# Patient Record
Sex: Female | Born: 1985
Health system: Southern US, Community
[De-identification: ages and names within clinical notes are randomized; demographics above are authoritative.]

## PROBLEM LIST (undated history)

## (undated) DIAGNOSIS — B009 Herpesviral infection, unspecified: Secondary | ICD-10-CM

## (undated) DIAGNOSIS — S73191A Other sprain of right hip, initial encounter: Secondary | ICD-10-CM

## (undated) DIAGNOSIS — T7840XA Allergy, unspecified, initial encounter: Secondary | ICD-10-CM

## (undated) DIAGNOSIS — Z8619 Personal history of other infectious and parasitic diseases: Secondary | ICD-10-CM

## (undated) DIAGNOSIS — R569 Unspecified convulsions: Secondary | ICD-10-CM

## (undated) DIAGNOSIS — M7061 Trochanteric bursitis, right hip: Secondary | ICD-10-CM

## (undated) DIAGNOSIS — G894 Chronic pain syndrome: Secondary | ICD-10-CM

## (undated) DIAGNOSIS — S069XAA Unspecified intracranial injury with loss of consciousness status unknown, initial encounter: Secondary | ICD-10-CM

## (undated) DIAGNOSIS — T50901A Poisoning by unspecified drugs, medicaments and biological substances, accidental (unintentional), initial encounter: Secondary | ICD-10-CM

## (undated) DIAGNOSIS — K589 Irritable bowel syndrome without diarrhea: Secondary | ICD-10-CM

## (undated) DIAGNOSIS — F418 Other specified anxiety disorders: Secondary | ICD-10-CM

## (undated) DIAGNOSIS — G8929 Other chronic pain: Secondary | ICD-10-CM

## (undated) DIAGNOSIS — S069X9A Unspecified intracranial injury with loss of consciousness of unspecified duration, initial encounter: Secondary | ICD-10-CM

## (undated) DIAGNOSIS — J309 Allergic rhinitis, unspecified: Secondary | ICD-10-CM

## (undated) DIAGNOSIS — S0990XA Unspecified injury of head, initial encounter: Secondary | ICD-10-CM

## (undated) DIAGNOSIS — Z87440 Personal history of urinary (tract) infections: Secondary | ICD-10-CM

## (undated) DIAGNOSIS — Z8669 Personal history of other diseases of the nervous system and sense organs: Secondary | ICD-10-CM

## (undated) DIAGNOSIS — M25551 Pain in right hip: Secondary | ICD-10-CM

## (undated) HISTORY — DX: Poisoning by unspecified drugs, medicaments and biological substances, accidental (unintentional), initial encounter: T50.901A

## (undated) HISTORY — PX: WISDOM TOOTH EXTRACTION: SHX21

## (undated) HISTORY — DX: Other specified anxiety disorders: F41.8

## (undated) HISTORY — PX: MOLE REMOVAL: SHX2046

## (undated) HISTORY — DX: Unspecified convulsions: R56.9

## (undated) HISTORY — DX: Allergy, unspecified, initial encounter: T78.40XA

## (undated) HISTORY — DX: Pain in right hip: M25.551

## (undated) HISTORY — DX: Chronic pain syndrome: G89.4

## (undated) HISTORY — DX: Other chronic pain: G89.29

## (undated) HISTORY — DX: Irritable bowel syndrome, unspecified: K58.9

## (undated) HISTORY — DX: Personal history of other diseases of the nervous system and sense organs: Z86.69

## (undated) HISTORY — DX: Unspecified intracranial injury with loss of consciousness of unspecified duration, initial encounter: S06.9X9A

## (undated) HISTORY — DX: Unspecified intracranial injury with loss of consciousness status unknown, initial encounter: S06.9XAA

## (undated) HISTORY — DX: Personal history of other infectious and parasitic diseases: Z86.19

## (undated) HISTORY — DX: Other sprain of right hip, initial encounter: S73.191A

## (undated) HISTORY — DX: Herpesviral infection, unspecified: B00.9

## (undated) HISTORY — DX: Trochanteric bursitis, right hip: M70.61

## (undated) HISTORY — DX: Unspecified injury of head, initial encounter: S09.90XA

## (undated) HISTORY — DX: Personal history of urinary (tract) infections: Z87.440

---
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
## 2005-10-22 ENCOUNTER — Emergency Department (HOSPITAL_COMMUNITY): Admission: EM | Admit: 2005-10-22 | Discharge: 2005-10-22 | Payer: Self-pay | Admitting: Emergency Medicine

## 2005-11-15 ENCOUNTER — Ambulatory Visit (HOSPITAL_COMMUNITY): Admission: RE | Admit: 2005-11-15 | Discharge: 2005-11-15 | Payer: Self-pay | Admitting: Family Medicine

## 2006-01-29 LAB — US OB COMP LESS 14 WKS

## 2006-06-18 ENCOUNTER — Inpatient Hospital Stay (HOSPITAL_COMMUNITY): Admission: AD | Admit: 2006-06-18 | Discharge: 2006-06-18 | Payer: Self-pay | Admitting: Obstetrics and Gynecology

## 2006-08-10 ENCOUNTER — Inpatient Hospital Stay (HOSPITAL_COMMUNITY): Admission: AD | Admit: 2006-08-10 | Discharge: 2006-08-12 | Payer: Self-pay | Admitting: Obstetrics and Gynecology

## 2006-08-16 ENCOUNTER — Inpatient Hospital Stay (HOSPITAL_COMMUNITY): Admission: AD | Admit: 2006-08-16 | Discharge: 2006-08-17 | Payer: Self-pay | Admitting: Obstetrics and Gynecology

## 2006-09-29 ENCOUNTER — Encounter: Admission: RE | Admit: 2006-09-29 | Discharge: 2006-12-28 | Payer: Self-pay | Admitting: Obstetrics and Gynecology

## 2008-01-24 ENCOUNTER — Emergency Department (HOSPITAL_COMMUNITY): Admission: EM | Admit: 2008-01-24 | Discharge: 2008-01-24 | Payer: Self-pay | Admitting: Emergency Medicine

## 2009-05-12 DIAGNOSIS — M25551 Pain in right hip: Secondary | ICD-10-CM

## 2009-05-12 HISTORY — DX: Pain in right hip: M25.551
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
## 2010-06-29 NOTE — Discharge Summary (Signed)
NAME:  Terri, [name]                         ACCOUNT NO.:  [id number]   MEDICAL RECORD NO.:  [id number]                   PATIENT TYPE:  INP   LOCATION:  0102                                 [facility]ILITY[facility]   PHYSICIAN:  Carolanne Grumbling, M.D.                 DATE OF BIRTH:  1985/07/12   DATE OF ADMISSION:  10/29/2002  DATE OF DISCHARGE:  10/29/2002                                 DISCHARGE SUMMARY   REPORT TITLE:  ADMISSION/DISCHARGE NOTE   INITIAL ASSESSMENT AND DIAGNOSES:  Terri [name] had been admitted to the hospital  by her parents and, at the time of admission, they had decided that they did  not want her in the hospital and were asking for her discharge.  She had  spent the evening in the emergency room waiting to be evaluated, so she and  her family were exhausted at the time of admission which was 12 hours later.  She had taken two Xanax earlier that day and then seven Xanax later that day  and, at some point, she no longer remembered taking anything but reportedly  she had taken more.  She said after taking the first two, she could not  recall anything.  She said she had no reason to want to kill herself.  She  said she had been using drugs but her parents said that was highly unlikely  also.  There was nothing particularly upsetting her at the time.  She said  she was stressed out by school and relationships and friends and she just  wanted to get some sleep and that is why she took the medication initially  but could not vouch for anything she did after that because she said she did  not recall it.  Her parents tried to arouse her the next morning and had  difficulty doing it.  Her father is a Optician, dispensing and a Acupuncturist at one  point and said in his young years he had been into drugs himself and had  been around a lot of drugs.  He knew what he was looking at and consequently  he took her to the hospital just to make sure she was okay.   In the process of evaluation, it was  decided that she needed to be admitted  to the hospital; at that moment they thought that might be reasonable.  After the long wait and thinking about it, both parents agreed that they  would rather have her home.  She maintained that she had used drugs such as  cocaine, marijuana, and alcohol.  Her parents knew she had used alcohol but  they said it was highly unlikely she had used marijuana or cocaine.  They  said she has been with them almost the whole time.  Even when she goes to  school, she goes to the school that the parents operate from [employer],  so they  see her during the daytime also.  They say she has always had  difficulty with following the rules and not being truthful about the things  she is doing.  She wants to live a life of more freedom than she has through  their supervision and their religious views.  They tried to find a set of  line between where they hold her accountable but at the same time, do not  squelch her so completely that she cannot find her own way in her life.  They obviously are very caring and loving people and she admitted that they  were.   MENTAL STATUS EXAMINATION:  Mental status at the time of the initial  evaluation revealed a very tired, sleepy, young woman who was cooperative.  She admitted to taking Xanax but denied any suicidal thoughts at the time,  saying that she wanted to just break her stress and get some sleep.  She  could not recall having taken the amount that she reportedly had taken.  There was no evidence of any thought disorder or other psychosis.  Short-  term and long-term memory were intact.  Judgment currently was adequate.  Insight was minimal.  Intellectual functioning seemed at least average.  Concentration was poor based on her sleepiness.  All pertinent history was  obtained from the parents.   HOSPITAL COURSE:  There was no hospital course because she was admitted and  discharged based on her parents' wishes and my  conclusion that she would be  well supervised and unlikely that she would do any damage to herself for  suicidal or other reasons.  Consequently, she was discharged.   LABORATORY DATA:  There were no laboratory examinations.   ADMISSION/DISCHARGE DIAGNOSES:   AXIS I:  1. Adjustment disorder with mixed emotional disturbance.  2. Alcohol abuse.   AXIS II:  Deferred.   AXIS III:  Healthy.   AXIS IV:  Moderate to severe with family, school, and social stressors.   AXIS V:  50/65.   POSTHOSPITAL CARE PLANS:  The family preferred a Librarian, academic and  were referred to Dr. Marliss Czar for followup.  The parents will pursue  psychiatric followup if needed but at the moment she is taking no  medications and they were not interested at that point in using medications  because she had not been particularly depressed or anxious.  Her issues  seemed to have been more behavioral and stress related.  There were no  restrictions placed on her activity or her diet.                                               Carolanne Grumbling, M.D.    GT/MEDQ  D:  11/11/2002  T:  11/11/2002  Job:  [id number]

## 2010-06-29 NOTE — Op Note (Signed)
NAME:  Terri [name], Terri [name]               ACCOUNT NO.:  [id number]   MEDICAL RECORD NO.:  [id number]          PATIENT TYPE:  AMB   LOCATION:  DSC                          FACILITY:  [facility]   PHYSICIAN:  Alfredia Ferguson, M.D.  DATE OF BIRTH:  December 28, 1985   DATE OF PROCEDURE:  03/08/2004  DATE OF DISCHARGE:                                 OPERATIVE REPORT   PREOPERATIVE DIAGNOSIS:  Biopsy proven dysplastic nevus right scapular area  2.5 cm x 1.5 cm.   POSTOPERATIVE DIAGNOSIS:  Biopsy proven dysplastic nevus right scapular area  2.5 cm x 1.5 cm.   OPERATION PERFORMED:  Excision of biopsy site with approximately 3 mm  margins and primary closure.   SURGEON:  Alfredia Ferguson, M.D.   ANESTHESIA:  MAC supplemented with 1% Xylocaine 1:100,000 epinephrine in the  excision site.   INDICATIONS FOR PROCEDURE:  The patient is a 25 year old female with a  biopsy proven dysplastic nevus with severe atypia.  She has been advised to  undergo re-excision of the biopsy site to clear margins.  The patient  understands she will be trading what she has for a permanent and unsightly  scar.  In spite of that she wishes to proceed.  Patient admitted  preoperatively because she was two days late on her period.  She was advised  to undergo pregnancy test and she refused.  She was offered the opportunity  to postpone her surgery to ensure that her period does start and that she is  not pregnant.  She refused.  She wishes to proceed in spite of the  possibility of damage to the fetus if she is pregnant.  In consultation with  the anesthesiologist and the patient and me, we opted to proceed since it is  being done under intravenous analgesia and not general anesthesia.   DESCRIPTION OF PROCEDURE:  Elliptical skin marker was placed around the  lesion with 3 to 4 mm margins.  The patient was then rolled into a left  lateral decubitus position and she was given intravenous analgesia.  Local  anesthesia was infiltrated  and the area was prepped with Betadine and draped  with sterile drapes.  An elliptical excision of the lesion down to the level  of the subcutaneous tissue was carried out.  Specimen was submitted for  pathology.  The wound edges were undermined for a distance of 1 cm in all  directions.  The wound was closed with interrupted 4-0 Monocryl suture for  the dermis followed by running 4-0 Monocryl subcuticular.  The patient  tolerated the procedure well with minimal blood loss.  She was transported  to the recovery room in satisfactory condition.      WBB/MEDQ  D:  03/08/2004  T:  03/08/2004  Job:  [id number]
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
## 2010-07-04 ENCOUNTER — Encounter: Payer: Self-pay | Admitting: Family Medicine

## 2010-07-05 ENCOUNTER — Telehealth: Payer: Self-pay | Admitting: Family Medicine

## 2010-07-05 ENCOUNTER — Ambulatory Visit (INDEPENDENT_AMBULATORY_CARE_PROVIDER_SITE_OTHER): Payer: BC Managed Care – PPO | Admitting: Family Medicine

## 2010-07-05 ENCOUNTER — Encounter: Payer: Self-pay | Admitting: Family Medicine

## 2010-07-05 VITALS — BP 122/73 | HR 73 | Temp 98.4°F | Ht 64.0 in | Wt 102.0 lb

## 2010-07-05 DIAGNOSIS — F329 Major depressive disorder, single episode, unspecified: Secondary | ICD-10-CM

## 2010-07-05 DIAGNOSIS — F411 Generalized anxiety disorder: Secondary | ICD-10-CM | POA: Insufficient documentation

## 2010-07-05 DIAGNOSIS — F418 Other specified anxiety disorders: Secondary | ICD-10-CM | POA: Insufficient documentation

## 2010-07-05 DIAGNOSIS — F410 Panic disorder [episodic paroxysmal anxiety] without agoraphobia: Secondary | ICD-10-CM | POA: Insufficient documentation

## 2010-07-05 DIAGNOSIS — M25551 Pain in right hip: Secondary | ICD-10-CM | POA: Insufficient documentation

## 2010-07-05 DIAGNOSIS — M25559 Pain in unspecified hip: Secondary | ICD-10-CM

## 2010-07-05 DIAGNOSIS — R634 Abnormal weight loss: Secondary | ICD-10-CM | POA: Insufficient documentation

## 2010-07-05 DIAGNOSIS — F32A Depression, unspecified: Secondary | ICD-10-CM

## 2010-07-05 HISTORY — DX: Generalized anxiety disorder: F41.1

## 2010-07-05 HISTORY — DX: Panic disorder (episodic paroxysmal anxiety): F41.0

## 2010-07-05 MED ORDER — CLONAZEPAM 0.5 MG PO TABS: ORAL_TABLET | ORAL | Status: DC

## 2010-07-05 MED ORDER — CITALOPRAM HYDROBROMIDE 20 MG PO TABS: 20.0000 mg | ORAL_TABLET | Freq: Every day | ORAL | Status: DC

## 2010-07-05 MED ORDER — HYDROCODONE-ACETAMINOPHEN 5-325 MG PO TABS: ORAL_TABLET | ORAL | Status: DC

## 2010-07-05 NOTE — Assessment & Plan Note (Signed)
Likely associated with poor PO intake related to her mood and anxiety disorders. Will check a few screening labs: TSH, CMET, CBC w/diff.  Our lab is not open today so she'll make lab appt in near future to get these drawn.

## 2010-07-05 NOTE — Patient Instructions (Signed)
Make lab appointment (here) for blood work at your earliest convenience. Make f/u appt with me in 3-4 wks.

## 2010-07-05 NOTE — Assessment & Plan Note (Signed)
See plan for GAD and depression.

## 2010-07-05 NOTE — Assessment & Plan Note (Signed)
Start citalopram 20mg  qd. Restart clonazepam bid PRN (0.5mg  tabs, 1-2 at a time).  Therapeutic expectations and side effect profile of medication discussed today.  Patient's questions answered.  Encouraged counseling, and she is interested in this.  See info under "depression" problem.

## 2010-07-05 NOTE — Progress Notes (Addendum)
Office Note 07/05/2010  CC:  Chief Complaint  Patient presents with  . Establish Care    new patient/ pt lost 15 lbs  . Anxiety    off and on since a little kid    HPI:  Terri [name] is a 25 y.o. White female who is here to establish care and discuss anxiety and depression problems. Patient's most recent primary MD: Dr. Erling Conte at [facility] [place].   Specialists: Dr. Merlyn Albert (ortho).  [name]. Old records were not reviewed prior to or during today's visit.  Patient reports extensive long term problem with anxiety/excessive worry, gradually getting worse over the last 1-2 yrs and more and more often associated with panic episodes--some provoked by excessive worrying and some spontaneous/unprovoked.  Describes these as sudden onset of severe fear/anxiety, heart racing, sweating, tremulousness, chest tightness, lightheadedness, feeling that something awful is going to happen to her.  These peak n 10-20 min and subside quickly after this point usually, but occasionally she'll be feeling awful like this for 1/2 a day or so.  Has been on klonopin in the past, helped somewhat but she had to d/c this when she joined the [employer] in 2011. Also reports significant feelings of depression, sadness, worthlessness over the last few months.  Sleep problems+, appetite down, energy level down.  No suicidal or homicidal thoughts.  In the past has been offered antidepressant but pt declined them for various reasons but she is now ready to try one.  Is also interested in counseling. Significant worries/stressors: has 26 y/o son who is primarily cared for by her parents.  Stressful job.  Fiance is stationed in another state in [employer].  She does not self medicate with drugs/alcohol.  She used to smoke cigs but quit.        Of note, she does report that she gave a child up for adoption shortly after birth, but when asked if she is aware of excessive guilt about this or frequent "reliving" of the  event, she reports that she hardly ever thinks about it at all. She does also report a distant history of closed head injury sustained in accidents as a toddler, resulting in ICH per her report, with subsequent seizure d/o for 2 yrs or so.  No significant residual disability, although she reports she struggled a lot to get through HS and she found that she was simply unable to succeed in college.  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 Since that time she reports regular orthopedist f/u (Dr. Merlyn Albert) and unsuccessful cortisone injection for chronic trochanteric pain syndrome/bursitis.  For at least the last year she has been taking 2 ultram per day to control her pain. Lives through a lot of pain without med, rarely takes NSAIDs.  Her PCP had been rx'ing her tramadol and she asks if I can assume this responsibility now.      Past Medical History  Diagnosis Date  . Depression   . HSV infection     Genital  . Drug overdose 2004    Xanax  . Anxiety   . Brain injury 25 yrs old    fell off babysitters cabinet, and on a separate occasion she fell down a flight of stairs  . Closed head injury About age 7 yrs    ICH per pt, with seizure disorder for appox 2 yrs afterward.  No significant residual disability.  Marland Kitchen  Seizure     for 2 yrs after closed head injury/ICH  . NSVD (normal spontaneous vaginal delivery)     x 2    Past Surgical History  Procedure Date  . Wisdom tooth extraction   . Mole removal     on back    Family History  Problem Relation Age of Onset  . Cancer Mother     Thyroid and breast/ remission  . Hepatitis Father     Hep C  . Thyroid disease Sister   . Cancer Sister     thyroid/ remission  . Anxiety disorder Sister   . Thrombophlebitis Maternal Grandfather   . Anxiety disorder Maternal Grandfather   . Alzheimer's disease Paternal Grandfather   Father with liver failure secondar to chronic  Hep C, is now s/p liver transplant.  History   Social History  . Marital Status: Single    Spouse Name: N/A    Number of Children: N/A  . Years of Education: N/A   Occupational History  . Not on file.   Social History Main Topics  . Smoking status: Former Smoker -- 0.3 packs/day    Types: Cigarettes    Quit date: 02/12/2007  . Smokeless tobacco: Never Used  . Alcohol Use: Yes     occasional  . Drug Use: No  . Sexually Active: Yes -- Female partner(s)   Other Topics Concern  . Not on file   Social History Narrative   Single, engaged.  Fiance in the [employer].Lives in [place] with her mom and dad and her 41 y/o son Terri [name].Works as a Mining engineer with mental/developmental disabilities, also part time at the [employer].Describes somewhat wild teenage years, "made some bad choices and hung out with the wrong people".  However, after that she had no issues with drugs or alcohol.Reports that she had a second child a couple of years after her son Terri [name] and she gave him/her up for adoption right after birth.      Outpatient Encounter Prescriptions as of 07/05/2010  Medication Sig Dispense Refill  . MedroxyPROGESTERone Acetate (DEPO-PROVERA IM) Inject into the muscle every 3 (three) months.        . valACYclovir (VALTREX) 1000 MG tablet Take 1,000 mg by mouth daily as needed.        Marland Kitchen DISCONTD: traMADol (ULTRAM) 50 MG tablet Take 100 mg by mouth daily.        . citalopram (CELEXA) 20 MG tablet Take 1 tablet (20 mg total) by mouth daily.  30 tablet  1  . clonazePAM (KLONOPIN) 0.5 MG tablet 1-2 tabs po q8-12 hrs prn severe anxiety  30 tablet  0  . HYDROcodone-acetaminophen (NORCO) 5-325 MG per tablet 1-2 tabs once daily as needed for pain  60 tablet  0  NOTE: Her meds as of the beginning of today's encounter were tramadol, valtrex, and depo-provera.   Allergies  Allergen Reactions  . Penicillins Rash    ROS Review of Systems  Constitutional: Positive for  appetite change (decreased). Negative for fever, chills and fatigue.  HENT: Negative for ear pain, congestion, sore throat, neck stiffness and dental problem.   Eyes: Negative for discharge, redness and visual disturbance.  Respiratory: Positive for chest tightness (with panic episodes). Negative for cough, shortness of breath and wheezing.   Cardiovascular: Positive for palpitations (with panic episodes--heart racing). Negative for chest pain and leg swelling.  Gastrointestinal: Negative for nausea, vomiting, abdominal pain, diarrhea and blood in stool.  Genitourinary:  Negative for dysuria, urgency, frequency, hematuria, flank pain and difficulty urinating.  Musculoskeletal: Positive for arthralgias (right hip, as per HPI). Negative for myalgias, back pain and joint swelling.  Skin: Negative for pallor and rash.  Neurological: Negative for dizziness, speech difficulty, weakness and headaches.  Hematological: Negative for adenopathy. Does not bruise/bleed easily.  Psychiatric/Behavioral: Positive for dysphoric mood. Negative for suicidal ideas, hallucinations, behavioral problems, confusion, sleep disturbance, self-injury, decreased concentration and agitation. The patient is nervous/anxious. The patient is not hyperactive.     PE; Blood pressure 122/73, pulse 73, temperature 98.4 F (36.9 C), temperature source Oral, height 5\' 4"  (1.626 m), weight 102 lb (46.267 kg), SpO2 100.00%. VITALS: Blood pressure 122/73, pulse 73, temperature 98.4 F (36.9 C), temperature source Oral, height 5\' 4"  (1.626 m), weight 102 lb (46.267 kg), SpO2 100.00%. Wt Readings from Last 2 Encounters:  07/05/10 102 lb (46.267 kg)   Gen: alert, oriented x 4, affect pleasant, well kempt.  Lucid thinking and conversation noted.  Occasionally tearful but no excessive crying or sobbing. HEENT: PERRLA, EOMI.   Neck: no LAD, mass, or thyromegaly. CV: RRR, no m/r/g LUNGS: CTA bilat, nonlabored. ABD: soft, NT, ND, BS normal.   No hepatospenomegaly or mass.  No bruits. NEURO: no tremor or tics noted on observation.  Coordination intact. CN 2-12 grossly intact bilaterally, strength 5/5 in all extremeties.  No ataxia.  Pertinent labs:  none  ASSESSMENT AND PLAN:   Depression Start citalopram 20mg  qd.  Therapeutic expectations and side effect profile of medication discussed today.  Patient's questions answered. She is interested in counseling as well, so I gave her a brochure with the [facility] counselors on it and she can contact one if she is interested.  I told her that is one of these does NOT interest her, then call and I'll set up referral to a non-[facility] counselor.    Generalized anxiety disorder Start citalopram 20mg  qd. Restart clonazepam bid PRN (0.5mg  tabs, 1-2 at a time).  Therapeutic expectations and side effect profile of medication discussed today.  Patient's questions answered.  Encouraged counseling, and she is interested in this.  See info under "depression" problem.  Panic disorder See plan for GAD and depression.  Right hip pain She'll continue f/u with her orthopedist. I did stop her tramadol and switched her to vicodin 5/325mg , 1-2 tabs once daily prn pain.  I did this b/c I feel like she has a relative contraindication to tramadol given her past history of seizure disorder and with the initiation of an SSRI today.   Therapeutic expectations and side effect profile of medication discussed today.  Patient's questions answered.   Weight loss Likely associated with poor PO intake related to her mood and anxiety disorders. Will check a few screening labs: TSH, CMET, CBC w/diff.  Our lab is not open today so she'll make lab appt in near future to get these drawn.    Return in about 3 weeks (around 07/26/2010) for anxiety follow up, and make appt with our lab for blood work at your earliest convenience.Marland Kitchen

## 2010-07-05 NOTE — Assessment & Plan Note (Signed)
She'll continue f/u with her orthopedist. I did stop her tramadol and switched her to vicodin 5/325mg , 1-2 tabs once daily prn pain.  I did this b/c I feel like she has a relative contraindication to tramadol given her past history of seizure disorder and with the initiation of an SSRI today.   Therapeutic expectations and side effect profile of medication discussed today.  Patient's questions answered.

## 2010-07-05 NOTE — Telephone Encounter (Signed)
Pls request records from Dr. Merlyn Albert (orthopedist in [place]), Dr. Erling Conte at [facility], and [facility].-- Thx.

## 2010-07-05 NOTE — Assessment & Plan Note (Signed)
Start citalopram 20mg  qd.  Therapeutic expectations and side effect profile of medication discussed today.  Patient's questions answered. She is interested in counseling as well, so I gave her a brochure with the [facility] counselors on it and she can contact one if she is interested.  I told her that is one of these does NOT interest her, then call and I'll set up referral to a non-[facility] counselor.

## 2010-07-10 ENCOUNTER — Other Ambulatory Visit: Payer: BC Managed Care – PPO

## 2010-07-10 DIAGNOSIS — R634 Abnormal weight loss: Secondary | ICD-10-CM

## 2010-07-10 LAB — COMPREHENSIVE METABOLIC PANEL
ALT: 16 U/L (ref 0–35)
AST: 15 U/L (ref 0–37)
Albumin: 4.6 g/dL (ref 3.5–5.2)
Alkaline Phosphatase: 46 U/L (ref 39–117)
BUN: 12 mg/dL (ref 6–23)
CO2: 26 mEq/L (ref 19–32)
Calcium: 9.9 mg/dL (ref 8.4–10.5)
Chloride: 106 mEq/L (ref 96–112)
Creat: 0.69 mg/dL (ref 0.40–1.20)
Glucose, Bld: 87 mg/dL (ref 70–99)
Potassium: 4.2 mEq/L (ref 3.5–5.3)
Sodium: 139 mEq/L (ref 135–145)
Total Bilirubin: 0.4 mg/dL (ref 0.3–1.2)
Total Protein: 7.4 g/dL (ref 6.0–8.3)

## 2010-07-10 LAB — CBC WITH DIFFERENTIAL/PLATELET
Basophils Absolute: 0 10*3/uL (ref 0.0–0.1)
Basophils Relative: 0 % (ref 0–1)
Eosinophils Absolute: 0.1 10*3/uL (ref 0.0–0.7)
Eosinophils Relative: 1 % (ref 0–5)
HCT: 37.7 % (ref 36.0–46.0)
Hemoglobin: 13.2 g/dL (ref 12.0–15.0)
Lymphocytes Relative: 26 % (ref 12–46)
Lymphs Abs: 2.9 10*3/uL (ref 0.7–4.0)
MCH: 30.9 pg (ref 26.0–34.0)
MCHC: 35 g/dL (ref 30.0–36.0)
MCV: 88.3 fL (ref 78.0–100.0)
Monocytes Absolute: 1 10*3/uL (ref 0.1–1.0)
Monocytes Relative: 8 % (ref 3–12)
Neutro Abs: 7.3 10*3/uL (ref 1.7–7.7)
Neutrophils Relative %: 65 % (ref 43–77)
Platelets: 260 10*3/uL (ref 150–400)
RBC: 4.27 MIL/uL (ref 3.87–5.11)
RDW: 12.4 % (ref 11.5–15.5)
WBC: 11.3 10*3/uL — ABNORMAL HIGH (ref 4.0–10.5)

## 2010-07-10 LAB — TSH: TSH: 1.714 u[IU]/mL (ref 0.350–4.500)

## 2010-07-10 NOTE — Telephone Encounter (Signed)
Records have been requested from all three facilities

## 2010-07-25 ENCOUNTER — Ambulatory Visit (INDEPENDENT_AMBULATORY_CARE_PROVIDER_SITE_OTHER): Payer: BC Managed Care – PPO | Admitting: Family Medicine

## 2010-07-25 ENCOUNTER — Encounter: Payer: Self-pay | Admitting: Family Medicine

## 2010-07-25 DIAGNOSIS — F411 Generalized anxiety disorder: Secondary | ICD-10-CM

## 2010-07-25 DIAGNOSIS — F419 Anxiety disorder, unspecified: Secondary | ICD-10-CM

## 2010-07-25 DIAGNOSIS — F32A Depression, unspecified: Secondary | ICD-10-CM

## 2010-07-25 DIAGNOSIS — F329 Major depressive disorder, single episode, unspecified: Secondary | ICD-10-CM

## 2010-07-25 MED ORDER — TRAMADOL HCL 50 MG PO TABS: ORAL_TABLET | ORAL | Status: DC

## 2010-07-25 NOTE — Assessment & Plan Note (Signed)
Problem stable.  She has poor support network. Wants me to initiate referral to psych---says she couldn't choose a [facility] psych to go to: will refer to psychologist at [facility] in [place]. Continue citalopram 20mg  qd at this time.  Pt. Denies suicidal ideation.

## 2010-07-25 NOTE — Progress Notes (Addendum)
OFFICE NOTE  07/25/2010  CC:  Chief Complaint  Patient presents with  . Follow-up    3 week follow up     HPI:   Patient is a 25 y.o. Caucasian female who is here for f/u anxiety/panic/depression. Started citalopram 20mg  qd 3 wks ago.  No signif change in mood symptoms.  Also still with appetite loss, excessive sleeping. Oversedated when takes 0.5mg  klonopin.  Hydrocodone not helping very long for pain, says ultram much more effective for this. Says orthopedist has plan for surgery on a ligament of her hip 10/10/10. Laments about being alone lately, no support network b/c boyfriend in [employer] and parents in [place] with her 38 y/o son.  She gets to talk to parents and son for 5 min daily.   She is working full time.  Denies suicidal ideation.  Reviewed labs from o/v 3 wks ago: CBC, CMET, TSH all normal.  Pertinent PMH:  GAD Panic disorder Depression Right hip pain  MEDS;   Outpatient Prescriptions Prior to Visit  Medication Sig Dispense Refill  . citalopram (CELEXA) 20 MG tablet Take 1 tablet (20 mg total) by mouth daily.  30 tablet  1  . clonazePAM (KLONOPIN) 0.5 MG tablet 1-2 tabs po q8-12 hrs prn severe anxiety  30 tablet  0  . MedroxyPROGESTERone Acetate (DEPO-PROVERA IM) Inject into the muscle every 3 (three) months.        . valACYclovir (VALTREX) 1000 MG tablet Take 1,000 mg by mouth daily as needed.        Marland Kitchen HYDROcodone-acetaminophen (NORCO) 5-325 MG per tablet 1-2 tabs once daily as needed for pain  60 tablet  0    PE: Blood pressure 93/64, pulse 70, temperature 97.7 F (36.5 C), temperature source Oral, height 5\' 4"  (1.626 m), weight 100 lb 12.8 oz (45.723 kg), SpO2 99.00%. VITALS: Blood pressure 93/64, pulse 70, temperature 97.7 F (36.5 C), temperature source Oral, height 5\' 4"  (1.626 m), weight 100 lb 12.8 oz (45.723 kg), SpO2 99.00%. Wt Readings from Last 2 Encounters:  07/25/10 100 lb 12.8 oz (45.723 kg)  07/05/10 102 lb (46.267 kg)    Gen: alert, oriented  x 4, affect pleasant.  Lucid thinking and conversation noted.  Cries easily, apologizes frequently for this. HEENT: PERRLA, EOMI.   Neck: no LAD, mass, or thyromegaly. CV: RRR, no m/r/g LUNGS: CTA bilat, nonlabored. NEURO: no tremor or tics noted on observation.  Coordination intact. CN 2-12 grossly intact bilaterally, strength 5/5 in all extremeties.  No ataxia.   IMPRESSION AND PLAN:  Depression Problem stable.  She has poor support network. Wants me to initiate referral to psych---says she couldn't choose a [facility] psych to go to: will refer to psychologist at [facility] in [place]. Continue citalopram 20mg  qd at this time.  Pt. Denies suicidal ideation.    I advised her to take 1/2 of a clonazepam per dose to minimize sedation. Also, we had changed from ultram to vicodin b/c of her hx of seizures as a child plus I was initiating an SSRI. However, she is miserable b/c vicodin not helpful enough/not lasting long enough.  Will switch back to tramadol 1-2 tabs bid prn. Spent 25 min with patient today, with 50% of this time spent in counseling and developing plans for her anxiety and depression.  FOLLOW UP:  Return in about 1 week (around 08/01/2010).

## 2010-07-26 ENCOUNTER — Ambulatory Visit: Payer: BC Managed Care – PPO | Admitting: Family Medicine

## 2010-07-30 ENCOUNTER — Encounter: Payer: Self-pay | Admitting: Family Medicine

## 2010-08-01 ENCOUNTER — Encounter: Payer: Self-pay | Admitting: Family Medicine

## 2010-08-01 ENCOUNTER — Ambulatory Visit (INDEPENDENT_AMBULATORY_CARE_PROVIDER_SITE_OTHER): Payer: BC Managed Care – PPO | Admitting: Family Medicine

## 2010-08-01 ENCOUNTER — Other Ambulatory Visit: Payer: Self-pay | Admitting: Family Medicine

## 2010-08-01 DIAGNOSIS — M25559 Pain in unspecified hip: Secondary | ICD-10-CM

## 2010-08-01 DIAGNOSIS — F329 Major depressive disorder, single episode, unspecified: Secondary | ICD-10-CM

## 2010-08-01 DIAGNOSIS — M25551 Pain in right hip: Secondary | ICD-10-CM

## 2010-08-01 DIAGNOSIS — F32A Depression, unspecified: Secondary | ICD-10-CM

## 2010-08-01 MED ORDER — CITALOPRAM HYDROBROMIDE 40 MG PO TABS: 40.0000 mg | ORAL_TABLET | Freq: Every day | ORAL | Status: DC

## 2010-08-01 MED ORDER — INDOMETHACIN 50 MG PO CAPS: ORAL_CAPSULE | ORAL | Status: DC

## 2010-08-01 NOTE — Assessment & Plan Note (Signed)
Stress injury to femoral neck and head 2011, with labral injury as well---lingering pain and disability. Cortisone injection x 1 by orthopedist made things worse short term per pt report.  Takes 2 tramadol tabs per day and only occasional NSAID for pain control (for the last year or so, as of 06/2010).  Pain med changed to vicodin 5/325, 1-2 tabs qd prn pain due to patient's relative contraindication to tramadol (history of seizures, plus since I added SSRI 07/05/10 she would have increased risk of serotonin syndrome when used with tramadol).  This resulted in poor/short lived pain control, so I switched her back to ultram 100mg  bid and she is still having pain between doses b/c of having to wait so long due to work, etc.  I will rx indocin 50mg  to take bid pr hip pain.  Continue tramadol 100mg  bid. She has plans with orthopedist for surgery at the end of the [place]. F/u 72mo.

## 2010-08-01 NOTE — Assessment & Plan Note (Signed)
Slight improvement but needs up-titration of citalopram.  Increase to 1 and 1/2 of her 20mg  tabs qd for 7d, then take 2 of these qd.  Next rx she'll pick up will be 40mg  tabs, take 1 qd. Continue clonazepam prn, take 1/4-1/2 tab at a time to minimize sedation. She'll continue to pursue [facility] psych appt. F/u 66mo, earlier if needed.

## 2010-08-01 NOTE — Progress Notes (Signed)
OFFICE NOTE  08/01/2010  CC: No chief complaint on file. F/u depression and hip pain.   HPI:   Patient is a 25 y.o. Caucasian female who is here for f/u depression and anxiety as well as right hip pain. Fairly stable mood overall, feels like the med is helping a little but not a lot.  Describes an emotional breakdown a few days ago that was the result of family problems. Appetite and sleep still somewhat off.  No suicidal ideation. She is playing phone tag with the folks at [facility] trying to get set up for initial psych eval/counseling. Pain control better since back on ultram, but still needs breakthrough med b/c of the spacing she has to do between ultram doses.  Pertinent PMH:  Depression Anxiety Right hip pain/stress injury/labral injury  MEDS;   Outpatient Prescriptions Prior to Visit  Medication Sig Dispense Refill  . clonazePAM (KLONOPIN) 0.5 MG tablet 1-2 tabs po q8-12 hrs prn severe anxiety  30 tablet  0  . MedroxyPROGESTERone Acetate (DEPO-PROVERA IM) Inject into the muscle every 3 (three) months.        . traMADol (ULTRAM) 50 MG tablet 1-2 tabs po twice daily as needed for pain  60 tablet  1  . valACYclovir (VALTREX) 1000 MG tablet Take 1,000 mg by mouth daily as needed.        . citalopram (CELEXA) 20 MG tablet Take 1 tablet (20 mg total) by mouth daily.  30 tablet  1    PE: Blood pressure 103/69, pulse 80, temperature 98.5 F (36.9 C), temperature source Tympanic, resp. rate 20, height 5\' 5"  (1.651 m), weight 101 lb 0.4 oz (45.825 kg). Affect is pleasant, thought and conversation is lucid.  She tears-up intermittently but has no outright crying/sobbing. No further exam today.  IMPRESSION AND PLAN:  Depression Slight improvement but needs up-titration of citalopram.  Increase to 1 and 1/2 of her 20mg  tabs qd for 7d, then take 2 of these qd.  Next rx she'll pick up will be 40mg  tabs, take 1 qd. Continue clonazepam prn, take 1/4-1/2 tab at a time to minimize  sedation. She'll continue to pursue [facility] psych appt. F/u 18mo, earlier if needed.  Right hip pain Stress injury to femoral neck and head 2011, with labral injury as well---lingering pain and disability. Cortisone injection x 1 by orthopedist made things worse short term per pt report.  Takes 2 tramadol tabs per day and only occasional NSAID for pain control (for the last year or so, as of 06/2010).  Pain med changed to vicodin 5/325, 1-2 tabs qd prn pain due to patient's relative contraindication to tramadol (history of seizures, plus since I added SSRI 07/05/10 she would have increased risk of serotonin syndrome when used with tramadol).  This resulted in poor/short lived pain control, so I switched her back to ultram 100mg  bid and she is still having pain between doses b/c of having to wait so long due to work, etc.  I will rx indocin 50mg  to take bid pr hip pain.  Continue tramadol 100mg  bid. She has plans with orthopedist for surgery at the end of the [place]. F/u 18mo.    15 minutes spent with patient today, with over 50% of that time spent in counseling and care coordination for her hip pain and depression.  FOLLOW UP:  Return in about 1 month (around 08/31/2010).

## 2010-08-13 ENCOUNTER — Other Ambulatory Visit: Payer: Self-pay | Admitting: *Deleted

## 2010-08-13 MED ORDER — CLONAZEPAM 0.5 MG PO TABS: ORAL_TABLET | ORAL | Status: DC

## 2010-08-13 NOTE — Telephone Encounter (Signed)
RX faxed

## 2010-08-13 NOTE — Telephone Encounter (Signed)
Faxed refill request from pharmacy.  Pt last seen on 6/20, follow up on 7/23.  Please advise.

## 2010-08-13 NOTE — Telephone Encounter (Signed)
May RF as previously prescribed, with 2 additional RFs--PM

## 2010-08-27 ENCOUNTER — Other Ambulatory Visit: Payer: Self-pay | Admitting: Family Medicine

## 2010-08-27 MED ORDER — TRAMADOL HCL 50 MG PO TABS: ORAL_TABLET | ORAL | Status: DC

## 2010-08-27 NOTE — Telephone Encounter (Signed)
OK tgo give with same sig, #60 again but no extra refills

## 2010-08-27 NOTE — Telephone Encounter (Signed)
Patient needs a refill on her Tramadol she is in a lot of pain.

## 2010-09-03 ENCOUNTER — Ambulatory Visit (INDEPENDENT_AMBULATORY_CARE_PROVIDER_SITE_OTHER): Payer: BC Managed Care – PPO | Admitting: Family Medicine

## 2010-09-03 ENCOUNTER — Encounter: Payer: Self-pay | Admitting: Family Medicine

## 2010-09-03 DIAGNOSIS — M25559 Pain in unspecified hip: Secondary | ICD-10-CM

## 2010-09-03 DIAGNOSIS — F3289 Other specified depressive episodes: Secondary | ICD-10-CM

## 2010-09-03 DIAGNOSIS — M25551 Pain in right hip: Secondary | ICD-10-CM

## 2010-09-03 DIAGNOSIS — F329 Major depressive disorder, single episode, unspecified: Secondary | ICD-10-CM

## 2010-09-03 DIAGNOSIS — F32A Depression, unspecified: Secondary | ICD-10-CM

## 2010-09-03 MED ORDER — OXYCODONE HCL 5 MG PO TABS: ORAL_TABLET | ORAL | Status: DC

## 2010-09-03 NOTE — Assessment & Plan Note (Signed)
This and her GAD/panic are gradually improving appropriately with up-titration of citalopram. Continue current med regimen.  Gave contact info for [facility] in [place] again so she can pursue counseling. F/u 64mo.

## 2010-09-03 NOTE — Progress Notes (Signed)
OFFICE NOTE  09/03/2010  CC:  Chief Complaint  Patient presents with  . Depression     HPI:   Patient is a 25 y.o. Caucasian female who is here for f/u depression and anxiety. Improving steadily, mood much less labile, rarely uses klonopin (2-3 times per week, 1/4-1/2 tab). Has had trouble getting in contact with [facility] in [place] so still has not started counseling.  Pain in hip gradually worsening, describes it as severe and often interferes with concentration at work and with sleep at home.  Tramadol helping less and less.  Indocin didn't help any more than ibu or tyl has in the past. She reports she is scheduled for surgery 10/10/10.  Pertinent PMH:  Dep/Anx/panic Right hip stress injury with labral injury  MEDS;   Outpatient Prescriptions Prior to Visit  Medication Sig Dispense Refill  . citalopram (CELEXA) 40 MG tablet Take 1 tablet (40 mg total) by mouth daily.  30 tablet  1  . clonazePAM (KLONOPIN) 0.5 MG tablet 1-2 tabs po q8-12 hrs prn severe anxiety  30 tablet  0  . indomethacin (INDOCIN) 50 MG capsule 1 tab po bid with food as needed for hip pain  60 capsule  1  . MedroxyPROGESTERone Acetate (DEPO-PROVERA IM) Inject into the muscle every 3 (three) months.        . traMADol (ULTRAM) 50 MG tablet 1-2 tabs po twice daily as needed for pain  60 tablet  0  . valACYclovir (VALTREX) 1000 MG tablet Take 1,000 mg by mouth daily as needed.          PE: Blood pressure 98/64, pulse 90, height 5\' 4"  (1.626 m), weight 100 lb (45.36 kg). Gen: Alert, well appearing.  Patient is oriented to person, place, time, and situation. Smiling, with no tendency to tear-up.  Lucid thought and speech.  IMPRESSION AND PLAN:  Depression This and her GAD/panic are gradually improving appropriately with up-titration of citalopram. Continue current med regimen.  Gave contact info for [facility] in [place] again so she can pursue counseling. F/u 18mo.  Right hip pain Stress injury + labral injury, pain  worsening. Will give oxycodone 5mg , 1-2 q6h prn trial, #30, no RF---use this for the most severe times.  Continue tramadol.  May d/c NSAIDs. Plans for surg with ortho 10/10/10.     FOLLOW UP:  Return in about 3 months (around 12/04/2010) for f/u dep/anx.

## 2010-09-03 NOTE — Assessment & Plan Note (Signed)
Stress injury + labral injury, pain worsening. Will give oxycodone 5mg , 1-2 q6h prn trial, #30, no RF---use this for the most severe times.  Continue tramadol.  May d/c NSAIDs. Plans for surg with ortho 10/10/10.

## 2010-09-06 DIAGNOSIS — Z87440 Personal history of urinary (tract) infections: Secondary | ICD-10-CM

## 2010-09-06 HISTORY — DX: Personal history of urinary (tract) infections: Z87.440

## 2010-10-01 ENCOUNTER — Other Ambulatory Visit: Payer: Self-pay | Admitting: Family Medicine

## 2010-10-01 ENCOUNTER — Emergency Department (HOSPITAL_COMMUNITY)
Admission: EM | Admit: 2010-10-01 | Discharge: 2010-10-01 | Disposition: A | Discharge status: Home / Self Care | Payer: BC Managed Care – PPO | Attending: Emergency Medicine | Admitting: Emergency Medicine

## 2010-10-01 DIAGNOSIS — F329 Major depressive disorder, single episode, unspecified: Secondary | ICD-10-CM | POA: Insufficient documentation

## 2010-10-01 DIAGNOSIS — F3289 Other specified depressive episodes: Secondary | ICD-10-CM | POA: Insufficient documentation

## 2010-10-01 DIAGNOSIS — R5381 Other malaise: Secondary | ICD-10-CM | POA: Insufficient documentation

## 2010-10-01 DIAGNOSIS — R209 Unspecified disturbances of skin sensation: Secondary | ICD-10-CM | POA: Insufficient documentation

## 2010-10-01 MED ORDER — OXYCODONE HCL 5 MG PO TABS: ORAL_TABLET | ORAL | Status: AC

## 2010-10-01 NOTE — Telephone Encounter (Signed)
Please advise Oxycodone and Tramadol?

## 2010-10-01 NOTE — Telephone Encounter (Signed)
Pt informed RX ready to pick up and md sent in tramadol to pharmacy

## 2010-10-02 ENCOUNTER — Telehealth: Payer: Self-pay | Admitting: *Deleted

## 2010-10-02 NOTE — Telephone Encounter (Signed)
Opened in error

## 2010-10-03 ENCOUNTER — Ambulatory Visit (INDEPENDENT_AMBULATORY_CARE_PROVIDER_SITE_OTHER): Payer: BC Managed Care – PPO | Admitting: Licensed Clinical Social Worker

## 2010-10-03 DIAGNOSIS — F331 Major depressive disorder, recurrent, moderate: Secondary | ICD-10-CM

## 2010-10-04 ENCOUNTER — Other Ambulatory Visit: Payer: Self-pay | Admitting: Orthopedic Surgery

## 2010-10-04 ENCOUNTER — Encounter (HOSPITAL_COMMUNITY): Payer: BC Managed Care – PPO

## 2010-10-04 LAB — URINALYSIS, ROUTINE W REFLEX MICROSCOPIC
Bilirubin Urine: NEGATIVE
Glucose, UA: NEGATIVE mg/dL
Hgb urine dipstick: NEGATIVE
Ketones, ur: NEGATIVE mg/dL
Nitrite: NEGATIVE
Protein, ur: NEGATIVE mg/dL
Specific Gravity, Urine: 1.018 (ref 1.005–1.030)
Urobilinogen, UA: 0.2 mg/dL (ref 0.0–1.0)
pH: 7.5 (ref 5.0–8.0)

## 2010-10-04 LAB — CBC
HCT: 40.6 % (ref 36.0–46.0)
Hemoglobin: 13.7 g/dL (ref 12.0–15.0)
MCH: 30.4 pg (ref 26.0–34.0)
MCHC: 33.7 g/dL (ref 30.0–36.0)
MCV: 90.2 fL (ref 78.0–100.0)
Platelets: 255 10*3/uL (ref 150–400)
RBC: 4.5 MIL/uL (ref 3.87–5.11)
RDW: 12.6 % (ref 11.5–15.5)
WBC: 8.2 10*3/uL (ref 4.0–10.5)

## 2010-10-04 LAB — COMPREHENSIVE METABOLIC PANEL
ALT: 10 U/L (ref 0–35)
AST: 15 U/L (ref 0–37)
Albumin: 4.3 g/dL (ref 3.5–5.2)
Alkaline Phosphatase: 48 U/L (ref 39–117)
BUN: 11 mg/dL (ref 6–23)
CO2: 28 mEq/L (ref 19–32)
Calcium: 9.9 mg/dL (ref 8.4–10.5)
Chloride: 106 mEq/L (ref 96–112)
Creatinine, Ser: 0.6 mg/dL (ref 0.50–1.10)
GFR calc Af Amer: >60 mL/min (ref >60)
GFR calc non Af Amer: >60 mL/min (ref >60)
Glucose, Bld: 55 mg/dL — ABNORMAL LOW (ref 70–99)
Potassium: 4 mEq/L (ref 3.5–5.1)
Sodium: 142 mEq/L (ref 135–145)
Total Bilirubin: 0.3 mg/dL (ref 0.3–1.2)
Total Protein: 8.1 g/dL (ref 6.0–8.3)

## 2010-10-04 LAB — HCG, SERUM, QUALITATIVE: Preg, Serum: NEGATIVE

## 2010-10-04 LAB — URINE MICROSCOPIC-ADD ON

## 2010-10-04 LAB — APTT: aPTT: 32 seconds (ref 24–37)

## 2010-10-04 LAB — PROTIME-INR
INR: 1.11 (ref 0.00–1.49)
Prothrombin Time: 14.5 seconds (ref 11.6–15.2)

## 2010-10-04 LAB — SURGICAL PCR SCREEN
MRSA, PCR: NEGATIVE
Staphylococcus aureus: POSITIVE — AB

## 2010-10-10 ENCOUNTER — Ambulatory Visit (INDEPENDENT_AMBULATORY_CARE_PROVIDER_SITE_OTHER): Payer: BC Managed Care – PPO | Admitting: Licensed Clinical Social Worker

## 2010-10-10 ENCOUNTER — Ambulatory Visit (HOSPITAL_COMMUNITY)
Admission: RE | Admit: 2010-10-10 | Discharge: 2010-10-10 | Disposition: A | Discharge status: Home / Self Care | Payer: BC Managed Care – PPO | Source: Ambulatory Visit | Attending: Orthopedic Surgery | Admitting: Orthopedic Surgery

## 2010-10-10 DIAGNOSIS — F331 Major depressive disorder, recurrent, moderate: Secondary | ICD-10-CM

## 2010-10-10 DIAGNOSIS — Z79899 Other long term (current) drug therapy: Secondary | ICD-10-CM | POA: Insufficient documentation

## 2010-10-10 DIAGNOSIS — M76899 Other specified enthesopathies of unspecified lower limb, excluding foot: Secondary | ICD-10-CM | POA: Insufficient documentation

## 2010-10-10 DIAGNOSIS — Z01812 Encounter for preprocedural laboratory examination: Secondary | ICD-10-CM | POA: Insufficient documentation

## 2010-10-10 HISTORY — PX: HIP SURGERY: SHX245

## 2010-10-11 NOTE — Op Note (Signed)
NAME:  Terri [name], Terri [name]               ACCOUNT NO.:  [id number]  MEDICAL RECORD NO.:  [id number]  LOCATION:  DAYL                         FACILITY:  [facility]  PHYSICIAN:  Ollen Gross, M.D.    DATE OF BIRTH:  1985/11/20  DATE OF PROCEDURE:  10/09/2008 DATE OF DISCHARGE:  10/10/2010                              OPERATIVE REPORT   PREOPERATIVE DIAGNOSIS:  Right hip intractable bursitis.  POSTOPERATIVE DIAGNOSIS:  Right hip intractable bursitis.  PROCEDURE:  Right hip bursectomy.  SURGEON:  Ollen Gross, M.D.  ASSISTANT:  Alexzandrew L. Perkins, P.A.C.  ANESTHESIA:  General.  ESTIMATED BLOOD LOSS:  Minimal.  DRAINS:  None.  COMPLICATIONS:  None.  CONDITION:  Stable to recovery room.  BRIEF CLINICAL NOTE:  Terri [name] is a 25 year old female with greater than 1- year history of intractable right lateral hip pain.  She has had extensive nonoperative management including protected weightbearing, activity modification, physical therapy, and injection.  She has this persistent lateral pain with focal tenderness over the tip of the greater trochanter.  MRI initially over a year ago had a stress reaction, but a followup MRI showed the stress reaction had resolved. She presents now for operative removal of the bursa given the intractable nature of this pain.  PROCEDURE IN DETAIL:  After successful administration of general anesthetic, the patient was placed in the left lateral decubitus position with the right side up and held with the hip positioner.  Right lower extremity was isolated from her perineum with plastic drapes and prepped and draped in the usual sterile fashion.  An approximately 5 cm lateral based incision was then made, centered over the tip of the greater trochanter.  The skin was cut with a #10 blade through subcutaneous tissue to the fascia lata.  This was then incised in line with the skin incision.  The bursa was identified and was slightly thick and enlarged, but  did not encounter any fluid.  The bursa was excised throughout its extent.  There were no calcifications in the bursa. Interestingly, there was an osteophyte or prominence on the greater trochanter directly over the area where she had been most painful clinically.  I removed this osteophyte and essentially flattened out the surface of the greater trochanter laterally.  We inspected the gluteus medius muscle and tendon and I did not see any tear of the tendon as it attached onto the tip of the greater trochanter.  I palpated the piriformis and had normal anatomic relationship and did not feel to be tight even with the rotation of the hip.  The gluteus minimus was then palpated and did not note any tears there.  The wound was then thoroughly irrigated with saline solution and hemostasis was achieved. I then closed the fascia lata with interrupted #1 Vicryl, leaving to open a triangular area over the tip of the greater trochanter to prevent any rubbing there.  30 cc of 0.25% Marcaine with epinephrine was then injected into the subcutaneous tissues.  Subcutaneous was closed with interrupted 2-0 Vicryl and subcuticular running 4-0 Monocryl.  Incisions cleaned and dried and Steri-Strips and bulky sterile dressing were applied.  She was then awakened and transferred to recovery in stable  condition.     Ollen Gross, M.D.     FA/MEDQ  D:  10/10/2010  T:  10/11/2010  Job:  [id number]  Electronically Signed by Ollen Gross M.D. on 10/11/2010 04:05:29 PM

## 2010-10-16 ENCOUNTER — Other Ambulatory Visit: Payer: Self-pay | Admitting: *Deleted

## 2010-10-16 MED ORDER — CITALOPRAM HYDROBROMIDE 40 MG PO TABS: 40.0000 mg | ORAL_TABLET | Freq: Every day | ORAL | Status: DC

## 2010-10-16 NOTE — Telephone Encounter (Signed)
Faxed request for refill from pharmacy.  Pt last seen on 09/03/10, next follow up scheduled for 12/04/10.  RX sent until that time.

## 2010-11-12 ENCOUNTER — Encounter: Payer: Self-pay | Admitting: Family Medicine

## 2010-11-12 ENCOUNTER — Other Ambulatory Visit: Payer: Self-pay | Admitting: *Deleted

## 2010-11-12 MED ORDER — TRAMADOL HCL 50 MG PO TABS: ORAL_TABLET | ORAL | Status: DC

## 2010-11-12 NOTE — Telephone Encounter (Signed)
Faxed request received from pharmacy for refill.  Last seen 09/03/10, follow up on 12/04/10.  OK to fill?

## 2010-11-16 LAB — POCT PREGNANCY, URINE: Preg Test, Ur: NEGATIVE

## 2010-11-27 LAB — URINALYSIS, ROUTINE W REFLEX MICROSCOPIC
Bilirubin Urine: NEGATIVE
Glucose, UA: NEGATIVE
Ketones, ur: NEGATIVE
Nitrite: NEGATIVE
Protein, ur: NEGATIVE
Specific Gravity, Urine: 1.015
Urobilinogen, UA: 0.2
pH: 6.5

## 2010-11-27 LAB — URINE MICROSCOPIC-ADD ON

## 2010-11-28 LAB — CBC
HCT: 31.5 — ABNORMAL LOW
HCT: 33.5 — ABNORMAL LOW
Hemoglobin: 10.8 — ABNORMAL LOW
Hemoglobin: 11.5 — ABNORMAL LOW
MCHC: 34.2
MCHC: 34.3
MCV: 90.5
MCV: 91.4
Platelets: 235
Platelets: 243
RBC: 3.45 — ABNORMAL LOW
RBC: 3.7 — ABNORMAL LOW
RDW: 13.8
RDW: 14.1 — ABNORMAL HIGH
WBC: 14.8 — ABNORMAL HIGH
WBC: 15.9 — ABNORMAL HIGH

## 2010-11-28 LAB — RPR: RPR Ser Ql: NONREACTIVE

## 2010-12-04 ENCOUNTER — Ambulatory Visit (INDEPENDENT_AMBULATORY_CARE_PROVIDER_SITE_OTHER): Payer: BC Managed Care – PPO | Admitting: Family Medicine

## 2010-12-04 ENCOUNTER — Encounter: Payer: Self-pay | Admitting: Family Medicine

## 2010-12-04 DIAGNOSIS — F411 Generalized anxiety disorder: Secondary | ICD-10-CM

## 2010-12-04 DIAGNOSIS — M25559 Pain in unspecified hip: Secondary | ICD-10-CM

## 2010-12-04 DIAGNOSIS — M25551 Pain in right hip: Secondary | ICD-10-CM

## 2010-12-04 MED ORDER — CLONAZEPAM 0.5 MG PO TABS: ORAL_TABLET | ORAL | Status: DC

## 2010-12-04 MED ORDER — CITALOPRAM HYDROBROMIDE 40 MG PO TABS: 40.0000 mg | ORAL_TABLET | Freq: Every day | ORAL | Status: DC

## 2010-12-04 MED ORDER — TRAMADOL HCL 50 MG PO TABS: ORAL_TABLET | ORAL | Status: DC

## 2010-12-04 NOTE — Assessment & Plan Note (Signed)
6 wks s/p surgery now, improving slowly.  Continue PT as per ortho. I RF'd #90 tramadol 50mg  today. F/u 75mo.

## 2010-12-04 NOTE — Assessment & Plan Note (Signed)
With depression: fairly stable.  Continue citalopram 40mg  qd, klonopin 0.5mg  bid prn.   She wants to hold off on pursuing any more counseling at this time. F/u with me in 2 mo.

## 2010-12-04 NOTE — Progress Notes (Signed)
OFFICE NOTE  12/04/2010  CC:  Chief Complaint  Patient presents with  . Depression  . Hip Pain    2nd round of PT     HPI:   Patient is a 25 y.o. Caucasian female who is here for f/u anx/dep and right hip pain. Anx/dep pretty bothersome lately b/c of lots of stressors ongoing--older sister moved back in her home and they don't get along, still working 2 jobs, also worried about her son who has started sleepwalking, broke up with boyfriend of 2 yrs recently. She wants to continue citalopram as is, says she is still rarely using klonopin. Went to a [employer] for counseling x 1 prior to hip surgery but got a bill for 500$ so hasn't returned.  Right hip surgery went good but she is now in her second round of PT and she still requires an avg of 3 tramadol per day. She says the pain is slowly improving.  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  Pertinent PMH:  Anxiety/panic Depression Right hip pain, chronic.  Past Surgical History  Procedure Date  . Wisdom tooth extraction   . Mole removal     on back  . Hip surgery 10/10/2010    Right hip bursectomy for intractable right greater troch pain    MEDS;   Outpatient Prescriptions Prior to Visit  Medication Sig Dispense Refill  . calcium carbonate (OS-CAL) 600 MG TABS Take 600 mg by mouth daily.        . citalopram (CELEXA) 40 MG tablet Take 1 tablet (40 mg total) by mouth daily.  30 tablet  1  . MedroxyPROGESTERone Acetate (DEPO-PROVERA IM) Inject into the muscle every 3 (three) months.        . valACYclovir (VALTREX) 1000 MG tablet Take 1,000 mg by mouth daily as needed.        . clonazePAM (KLONOPIN) 0.5 MG tablet 1-2 tabs po q8-12 hrs prn severe anxiety  30 tablet  0  . indomethacin (INDOCIN) 50 MG capsule 1 tab po bid with food as needed for hip pain  60 capsule  1  . traMADol (ULTRAM) 50 MG tablet 1-2 tabs po bid  prn pain  60 tablet  0    PE: Blood pressure 92/60, pulse 75, temperature 98.2 F (36.8 C), temperature source Oral, height 5\' 5"  (1.651 m), weight 105 lb (47.628 kg), SpO2 97.00%. Gen: Alert, well appearing.  Patient is oriented to person, place, time, and situation.  Well kempt.  Pleasant but slightly nervous affect. Thought and conversation are lucid. No further exam today.  IMPRESSION AND PLAN:  Generalized anxiety disorder With depression: fairly stable.  Continue citalopram 40mg  qd, klonopin 0.5mg  bid prn.   She wants to hold off on pursuing any more counseling at this time. F/u with me in 2 mo.  Right hip pain 6 wks s/p surgery now, improving slowly.  Continue PT as per ortho. I RF'd #90 tramadol 50mg  today. F/u 14mo.     FOLLOW UP:  Return in about 2 days (around 12/06/2010) for f/u dep/anx/pain.

## 2010-12-12 ENCOUNTER — Ambulatory Visit (INDEPENDENT_AMBULATORY_CARE_PROVIDER_SITE_OTHER): Payer: BC Managed Care – PPO | Admitting: Family Medicine

## 2010-12-12 ENCOUNTER — Encounter: Payer: Self-pay | Admitting: Family Medicine

## 2010-12-12 VITALS — BP 92/70 | HR 78 | Temp 98.3°F | Ht 65.0 in | Wt 105.0 lb

## 2010-12-12 DIAGNOSIS — Z23 Encounter for immunization: Secondary | ICD-10-CM

## 2010-12-12 DIAGNOSIS — G4700 Insomnia, unspecified: Secondary | ICD-10-CM

## 2010-12-12 MED ORDER — ZOLPIDEM TARTRATE 10 MG PO TABS: 10.0000 mg | ORAL_TABLET | Freq: Every evening | ORAL | Status: DC | PRN

## 2010-12-12 NOTE — Assessment & Plan Note (Signed)
Largely related to her chronic anxiety/stress/depression.  General lab eval (including thyroid testing) was normal just a few months ago. Unfortunately, these psychological problems stem mostly from her general life situations that are currently unchangeable. Will continue all current meds and start trial of ambien 10mg  qhs prn.  Therapeutic expectations and side effect profile of medication discussed today.  Patient's questions answered.   If this is helpful for initiation but not for maintenance, will try ambien CR. She will call with report in a week or two. Routine f/u in office in 4 mo.

## 2010-12-12 NOTE — Progress Notes (Signed)
OFFICE NOTE  12/12/2010  CC:  Chief Complaint  Patient presents with  . Follow-up    meds     HPI:   Patient is a 25 y.o. Caucasian female who is here for f/u insomnia. She has chronic anxiety and right hip pain and was here for f/u of these recently but was rushed b/c of another appt so she wanted to return today to discuss an issue that she wasn't able to bring up last time. Basically can't initiate sleep well and can't maintain sleep well.  Has been occurring over a year, seems to get worse when stress/anxiety is worse, which applies to recent worsening.  Denies restless legs.  Says she has tried all the OTC meds available to help insomnia and none have helped any.  Tramadol and clonazepam have helped some to initiate sleep but she still wakes up frequently and can't fall back asleep. Hip pain impacts sleep some but even when pain controlled she has insomnia.  Sleep hygiene reviewed today and seems fair. She says on the rare night that she gets a full night of sleep she feels extremely good the next day.  However, most days she struggles with fatigue and sleepiness b/c of chronic lack of sleep.  Pertinent PMH:  GAD Depression Right hip pain, s/p surgery fall 2012  MEDS;   Outpatient Prescriptions Prior to Visit  Medication Sig Dispense Refill  . calcium carbonate (OS-CAL) 600 MG TABS Take 600 mg by mouth daily.        . citalopram (CELEXA) 40 MG tablet Take 1 tablet (40 mg total) by mouth daily.  30 tablet  2  . clonazePAM (KLONOPIN) 0.5 MG tablet 1-2 tabs po q8-12 hrs prn severe anxiety  30 tablet  1  . MedroxyPROGESTERone Acetate (DEPO-PROVERA IM) Inject into the muscle every 3 (three) months.        . traMADol (ULTRAM) 50 MG tablet 1-2 tabs po bid prn pain  90 tablet  0  . valACYclovir (VALTREX) 1000 MG tablet Take 1,000 mg by mouth daily as needed.          PE: Blood pressure 92/70, pulse 78, temperature 98.3 F (36.8 C), temperature source Oral, height 5\' 5"  (1.651 m),  weight 105 lb (47.628 kg). Gen: Alert, well appearing.  Patient is oriented to person, place, time, and situation.  Occasionally tears up during interview.  Lucid thought and speech.  Affect is pleasant, mildly anxious. No further exam today.  IMPRESSION AND PLAN:  Insomnia Largely related to her chronic anxiety/stress/depression.  General lab eval (including thyroid testing) was normal just a few months ago. Unfortunately, these psychological problems stem mostly from her general life situations that are currently unchangeable. Will continue all current meds and start trial of ambien 10mg  qhs prn.  Therapeutic expectations and side effect profile of medication discussed today.  Patient's questions answered.   If this is helpful for initiation but not for maintenance, will try ambien CR. She will call with report in a week or two. Routine f/u in office in 4 mo.   Flu vaccine IM given today.  FOLLOW UP:  Return in about 4 months (around 04/11/2011) for f/u anx and pain.

## 2010-12-17 ENCOUNTER — Other Ambulatory Visit: Payer: Self-pay

## 2010-12-17 MED ORDER — CITALOPRAM HYDROBROMIDE 40 MG PO TABS: 40.0000 mg | ORAL_TABLET | Freq: Every day | ORAL | Status: DC

## 2010-12-18 ENCOUNTER — Telehealth: Payer: Self-pay | Admitting: *Deleted

## 2010-12-18 NOTE — Telephone Encounter (Signed)
VM left by patient.  Medication given for sleep is not working.  I have attempted to contact this patient by phone with the following results: left message to return my call on answering machine (home/mobile).

## 2010-12-20 MED ORDER — ZOLPIDEM TARTRATE ER 12.5 MG PO TBCR: 12.5000 mg | EXTENDED_RELEASE_TABLET | Freq: Every evening | ORAL | Status: DC | PRN

## 2010-12-20 NOTE — Telephone Encounter (Signed)
RC from pt.  She states she is able to go to sleep on ambien, but she does not stay asleep through the night.  Pt has not tried any other prescription meds for sleep.  (Pt is at work, call back on cell, if no answer leave message and patient will call us back.)

## 2010-12-20 NOTE — Telephone Encounter (Signed)
I'll do rx for Ambien CR and hopefully this will help her sleep through the entire night.--PM

## 2010-12-20 NOTE — Telephone Encounter (Signed)
Left a detailed message for patient and faxed RX

## 2011-01-10 ENCOUNTER — Other Ambulatory Visit: Payer: Self-pay | Admitting: Family Medicine

## 2011-01-10 ENCOUNTER — Telehealth: Payer: Self-pay | Admitting: *Deleted

## 2011-01-10 MED ORDER — TRAMADOL HCL 50 MG PO TABS: ORAL_TABLET | ORAL | Status: DC

## 2011-01-10 NOTE — Telephone Encounter (Signed)
RC from pt.  Pt would like refill on tramadol.  Pt saw specialist today who started her on Lyrica 50 mg twice daily and Lidoderm patch daily.  She will return to specialist in 6 weeks.  She would like refill on tramadol as back up as she does not know how she will do with new meds.  Last filled 12/04/10 #90 x 0.  Please advise.

## 2011-01-10 NOTE — Telephone Encounter (Signed)
RX sent as prescribed previously.

## 2011-01-10 NOTE — Telephone Encounter (Signed)
OK to RF this as previously prescribed with no additional RFs--PM

## 2011-01-10 NOTE — Telephone Encounter (Signed)
VM left stating refill needed.  Pt did not leave name of medication needing to be refilled.  I have attempted to contact this patient by phone with the following results: left message to return my call on answering machine (home/mobile).

## 2011-02-13 ENCOUNTER — Other Ambulatory Visit: Payer: Self-pay | Admitting: *Deleted

## 2011-02-13 NOTE — Telephone Encounter (Signed)
Last seen 10/31, follow up scheduled on 04/11/11.  Message left on pt home number to return regarding pain and verify medication name left on my voicemail.

## 2011-02-14 NOTE — Telephone Encounter (Signed)
Pain is same.  Sometimes worse, goes back to orthopedist this month.  Clonazepam other medication pt needs refill for.

## 2011-02-15 MED ORDER — TRAMADOL HCL 50 MG PO TABS: ORAL_TABLET | ORAL | Status: DC

## 2011-02-15 MED ORDER — CLONAZEPAM 0.5 MG PO TABS: ORAL_TABLET | ORAL | Status: DC

## 2011-04-08 ENCOUNTER — Ambulatory Visit (INDEPENDENT_AMBULATORY_CARE_PROVIDER_SITE_OTHER): Payer: BC Managed Care – PPO | Admitting: *Deleted

## 2011-04-08 DIAGNOSIS — Z111 Encounter for screening for respiratory tuberculosis: Secondary | ICD-10-CM

## 2011-04-08 NOTE — Progress Notes (Signed)
Pt needs annual TB test for work.  She does not have a form we need to fill out.  She will need a copy of the results when she returns to have test read.  PPD placed.  Pt will return after 11:30 am on Wednesday to have test read.

## 2011-04-10 ENCOUNTER — Encounter: Payer: Self-pay | Admitting: *Deleted

## 2011-04-10 LAB — TB SKIN TEST
Induration: 0
TB Skin Test: NEGATIVE mm

## 2011-04-11 ENCOUNTER — Encounter: Payer: Self-pay | Admitting: Family Medicine

## 2011-04-11 ENCOUNTER — Ambulatory Visit (INDEPENDENT_AMBULATORY_CARE_PROVIDER_SITE_OTHER): Payer: BC Managed Care – PPO | Admitting: Family Medicine

## 2011-04-11 DIAGNOSIS — M25559 Pain in unspecified hip: Secondary | ICD-10-CM

## 2011-04-11 DIAGNOSIS — F32A Depression, unspecified: Secondary | ICD-10-CM

## 2011-04-11 DIAGNOSIS — F329 Major depressive disorder, single episode, unspecified: Secondary | ICD-10-CM

## 2011-04-11 DIAGNOSIS — G4700 Insomnia, unspecified: Secondary | ICD-10-CM

## 2011-04-11 DIAGNOSIS — M25551 Pain in right hip: Secondary | ICD-10-CM

## 2011-04-11 DIAGNOSIS — F411 Generalized anxiety disorder: Secondary | ICD-10-CM

## 2011-04-11 MED ORDER — TRAMADOL HCL 50 MG PO TABS: ORAL_TABLET | ORAL | Status: DC

## 2011-04-11 MED ORDER — ZOLPIDEM TARTRATE 10 MG PO TABS: 10.0000 mg | ORAL_TABLET | Freq: Every evening | ORAL | Status: DC | PRN

## 2011-04-11 NOTE — Assessment & Plan Note (Signed)
+   Depression: fairly stable.  Lots of life circumstances continually have her down but she's struggling through ok.  No changes in meds.

## 2011-04-11 NOTE — Assessment & Plan Note (Signed)
Ongoing/unchanged.  Still requiring 4 tramadol tabs daily to stay functional.  Most recent "1 time" rx given by her back specialist Dr. Ethelene Hal, but I did RF of this today, #120, with 1 additional RF.  We'll obtain records from Dr. Ethelene Hal to see exactly what series of injections he has offered her/she is currently considering.

## 2011-04-11 NOTE — Assessment & Plan Note (Signed)
Fair response to [place] CR--continue this, rx done today.
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
## 2011-05-13 ENCOUNTER — Other Ambulatory Visit (INDEPENDENT_AMBULATORY_CARE_PROVIDER_SITE_OTHER): Payer: BC Managed Care – PPO

## 2011-05-13 DIAGNOSIS — Z3040 Encounter for surveillance of contraceptives, unspecified: Secondary | ICD-10-CM

## 2011-05-23 ENCOUNTER — Other Ambulatory Visit: Payer: Self-pay | Admitting: *Deleted

## 2011-05-23 MED ORDER — CLONAZEPAM 0.5 MG PO TABS: ORAL_TABLET | ORAL | Status: DC

## 2011-05-23 MED ORDER — TRAMADOL HCL 50 MG PO TABS: ORAL_TABLET | ORAL | Status: DC

## 2011-05-23 NOTE — Telephone Encounter (Signed)
Refill request received for tramadol and clonazepam Last seen on 04/11/11 Follow up needed 07/2011 Tramadol last filled by MD on 2/28, #120 x 1 Clonazepam last filled by MD on 1/4, #30 x 2 Please advise.

## 2011-05-24 NOTE — Telephone Encounter (Signed)
RX faxed

## 2011-06-11 ENCOUNTER — Other Ambulatory Visit: Payer: Self-pay | Admitting: Family Medicine

## 2011-06-11 MED ORDER — VALACYCLOVIR HCL 1 G PO TABS: 1000.0000 mg | ORAL_TABLET | Freq: Every day | ORAL | Status: DC | PRN

## 2011-06-11 NOTE — Telephone Encounter (Signed)
RX sent

## 2011-06-26 ENCOUNTER — Other Ambulatory Visit: Payer: Self-pay | Admitting: Family Medicine

## 2011-06-26 ENCOUNTER — Other Ambulatory Visit: Payer: Self-pay | Admitting: *Deleted

## 2011-06-26 MED ORDER — CITALOPRAM HYDROBROMIDE 40 MG PO TABS: 40.0000 mg | ORAL_TABLET | Freq: Every day | ORAL | Status: DC

## 2011-06-26 NOTE — Telephone Encounter (Signed)
Faxed refill request received from pharmacy for 90 day supply citalopram Last filled by MD on 12/17/10, #30 x 2 Last seen on 04/11/11 Follow up on 08/09/11 RX sent

## 2011-06-26 NOTE — Telephone Encounter (Signed)
Refill on Tramadol Last filled 05/23/11, #120 x 1 Last seen on 04/11/11  Follow up on 08/09/11 Per Arline Asp at pharmacy pt has one refill left.  She will fill this for patient.

## 2011-07-22 ENCOUNTER — Telehealth: Payer: Self-pay | Admitting: Family Medicine

## 2011-07-22 NOTE — Telephone Encounter (Signed)
Advised pt to get verification of last injection from facility where last injection was given.  Pt is agreeable.

## 2011-07-22 NOTE — Telephone Encounter (Signed)
Pt called and inquired about getting her depo Provera shots here. She is currently on this med.  Please call and ask her for verification of her last dosing, then get her to come in at the appropriate interval for her next shot. Thx!

## 2011-07-30 ENCOUNTER — Other Ambulatory Visit: Payer: Self-pay | Admitting: Family Medicine

## 2011-07-31 MED ORDER — TRAMADOL HCL 50 MG PO TABS: ORAL_TABLET | ORAL | Status: DC

## 2011-07-31 NOTE — Telephone Encounter (Signed)
Refill request for TRAMADOL Last filled-05/23/11, #120 X 1 Last seen-04/11/11 Follow up -08/09/11 Please advise refills.

## 2011-08-02 ENCOUNTER — Other Ambulatory Visit (INDEPENDENT_AMBULATORY_CARE_PROVIDER_SITE_OTHER): Payer: BC Managed Care – PPO

## 2011-08-02 DIAGNOSIS — Z3009 Encounter for other general counseling and advice on contraception: Secondary | ICD-10-CM

## 2011-08-02 MED ORDER — MEDROXYPROGESTERONE ACETATE 150 MG/ML IM SUSP
150.0000 mg | Freq: Once | INTRAMUSCULAR | Status: AC
  Administered 2011-08-02: 150 mg via INTRAMUSCULAR

## 2011-08-02 NOTE — Progress Notes (Unsigned)
Next Depo due-10-24-2011

## 2011-08-09 ENCOUNTER — Ambulatory Visit: Payer: BC Managed Care – PPO | Admitting: Family Medicine

## 2011-08-09 DIAGNOSIS — Z0289 Encounter for other administrative examinations: Secondary | ICD-10-CM

## 2011-08-29 ENCOUNTER — Other Ambulatory Visit: Payer: Self-pay | Admitting: Family Medicine

## 2011-08-29 NOTE — Telephone Encounter (Signed)
Refill request for Tramadol Last filled- 07/31/11, 120 x 1  Last seen-04/11/11 Follow up - on 08/09/11, no showed--has not rescheduled.  Per Viviann Spare at [facility] pt has refill remaining.  He will fill this for patient.  No additional refills given.

## 2011-08-29 NOTE — Telephone Encounter (Signed)
Patient is requesting Tramadol refill.

## 2011-10-02 ENCOUNTER — Other Ambulatory Visit: Payer: Self-pay | Admitting: Family Medicine

## 2011-10-02 MED ORDER — ZOLPIDEM TARTRATE 10 MG PO TABS: 10.0000 mg | ORAL_TABLET | Freq: Every evening | ORAL | Status: DC | PRN

## 2011-10-02 MED ORDER — TRAMADOL HCL 50 MG PO TABS: ORAL_TABLET | ORAL | Status: DC

## 2011-10-02 NOTE — Telephone Encounter (Signed)
I authorized 30d supply of each.  Needs f/u prior to any FURTHER RF's.-thx

## 2011-10-02 NOTE — Telephone Encounter (Signed)
Refill request for Tramadol Last filled- 07/31/11, 120 x 1  Last seen-04/11/11  Follow up - on 08/09/11, no showed--has not rescheduled.  Refill request for AMBIEN Last filled- 04/11/11, 30 X 3 Last seen-04/11/11  Follow up - on 08/09/11, no showed--has not rescheduled.  Please advise refills.

## 2011-10-03 NOTE — Telephone Encounter (Signed)
Faxed RX 

## 2011-10-03 NOTE — Telephone Encounter (Signed)
Detailed message left for patient that she will need appt prior to any additional refills.  OK to schedule with scheduler or ask for me if she has any other questions.  Advised out of office this PM, but will be back in office tomorrow.

## 2011-10-04 ENCOUNTER — Encounter: Payer: Self-pay | Admitting: Family Medicine

## 2011-10-04 ENCOUNTER — Ambulatory Visit (INDEPENDENT_AMBULATORY_CARE_PROVIDER_SITE_OTHER): Payer: BC Managed Care – PPO | Admitting: Family Medicine

## 2011-10-04 VITALS — BP 99/63 | HR 83 | Ht 65.0 in | Wt 108.0 lb

## 2011-10-04 DIAGNOSIS — Z808 Family history of malignant neoplasm of other organs or systems: Secondary | ICD-10-CM

## 2011-10-04 DIAGNOSIS — G4700 Insomnia, unspecified: Secondary | ICD-10-CM

## 2011-10-04 DIAGNOSIS — F411 Generalized anxiety disorder: Secondary | ICD-10-CM

## 2011-10-04 DIAGNOSIS — M25559 Pain in unspecified hip: Secondary | ICD-10-CM

## 2011-10-04 DIAGNOSIS — M25551 Pain in right hip: Secondary | ICD-10-CM

## 2011-10-04 LAB — TSH: TSH: 2.125 u[IU]/mL (ref 0.350–4.500)

## 2011-10-04 MED ORDER — ZOLPIDEM TARTRATE ER 12.5 MG PO TBCR: 12.5000 mg | EXTENDED_RELEASE_TABLET | Freq: Every evening | ORAL | Status: DC | PRN

## 2011-10-04 NOTE — Assessment & Plan Note (Signed)
+  Depression. Problem stable.  Continue current medications and diet appropriate for this condition.  We have reviewed our general long term plan for this problem and also reviewed symptoms and signs that should prompt the patient to call or return to the office.

## 2011-10-04 NOTE — Progress Notes (Signed)
OFFICE VISIT  10/04/2011   CC:  Chief Complaint  Patient presents with  . Follow-up    anxiety, hip pain; also states she is having some tenderness on left side of neck     HPI:    Patient is a 26 y.o. Caucasian female who presents for 6 mo f/u chronic right hip pain, anxiety/depression, and insomnia.   After some initial improvement in her chronic hip pain from therapeutic injections (not steroid per pt) from Dr. Ethelene Hal, she started to ween off pain meds some and started exercising slowly again but this brought return of her pain.  She is very frustrated with this, has made appt with Dr. Ethelene Hal for Sept 6th.  Back to using ultram 2 tabs twice per day.  The pain starts in the morning, feels tight, worse with wt bearing and worse through the day. She is frustrated b/c she wants to be in the [employer] and this may mean she can never do this. Currently still working long weeks, not doing school at this time. Sleep is much better when she takes her ambien CR 12.5mg . Overall, she denies any significant depressed mood or severe anxiety.  Just mostly frustrated lately with the return of her pain.   Past Medical History  Diagnosis Date  . HSV infection     Genital clx pos HSV 2--2007.  . Drug overdose 2004    Xanax  . Anxiety     Failed buspar  . Brain injury 26 yrs old    fell off babysitters cabinet, and on a separate occasion she fell down a flight of stairs  . Closed head injury About age 71 yrs    ICH per pt, with seizure disorder for appox 2 yrs afterward.  No significant residual disability.  . Seizure     for 2 yrs after closed head injury/ICH  . NSVD (normal spontaneous vaginal delivery)     x 2.  One was given up for adoption (2008).  . Right hip pain 05/2009    MRI 10/12/09: Femoral head and neck stress injury plus small acetabular labral tear  . History of chlamydia   . H/O varicella   . Abdominal pain, lower 01/15/05    Postpartum  . H/O vaginitis 06/04/07  . Nausea 10/22/07    With mense  . BV (bacterial vaginosis) 11/24/07   . Candida vaginitis 8/26/ 10   . Hx: UTI (urinary tract infection) 09/06/10  . Depression     Lexapro--loss of sex drive.  Cymbalta--oversedated  . Decreased appetite 06/04/07    Past Surgical History  Procedure Date  . Wisdom tooth extraction   . Mole removal     on back  . Hip surgery 10/10/2010    Right hip bursectomy for intractable right greater troch pain   Past surgical, social, and family history reviewed we did review the fact that both her mom and her sister have had thyroid cancer (each with a different kind).  Outpatient Prescriptions Prior to Visit  Medication Sig Dispense Refill  . calcium carbonate (OS-CAL) 600 MG TABS Take 600 mg by mouth daily.        . citalopram (CELEXA) 40 MG tablet Take 1 tablet (40 mg total) by mouth daily.  90 tablet  0  . clonazePAM (KLONOPIN) 0.5 MG tablet 1-2 tabs po q8-12 hrs prn severe anxiety  30 tablet  2  . MedroxyPROGESTERone Acetate (DEPO-PROVERA IM) Inject into the muscle every 3 (three) months.        Marland Kitchen  traMADol (ULTRAM) 50 MG tablet 1-2 tabs po bid prn pain  120 tablet  0  . valACYclovir (VALTREX) 1000 MG tablet Take 1 tablet (1,000 mg total) by mouth daily as needed.  30 tablet  0  . zolpidem (AMBIEN) 10 MG tablet Take 1 tablet (10 mg total) by mouth at bedtime as needed for sleep.  30 tablet  0  . clindamycin (CLEOCIN) 300 MG capsule         Allergies  Allergen Reactions  . Penicillins Rash    ROS As per HPI  PE: Blood pressure 99/63, pulse 83, height 5\' 5"  (1.651 m), weight 108 lb (48.988 kg). ENT: Ears: EACs clear, normal epithelium.  TMs with good light reflex and landmarks bilaterally.  Eyes: no injection, icteris, swelling, or exudate.  EOMI, PERRLA. Nose: no drainage or turbinate edema/swelling.  No injection or focal lesion.  Mouth: lips without lesion/swelling.  Oral mucosa pink and moist.  Dentition intact and without obvious caries or gingival swelling.   Oropharynx without erythema, exudate, or swelling.  Neck - No masses or thyromegaly or limitation in range of motion CV: RRR, no m/r/g.   LUNGS: CTA bilat, nonlabored resps, good aeration in all lung fields. Right hip TTP on lateral aspect diffusely.  Increased pain with ER and IR.    LABS:  None today  IMPRESSION AND PLAN:  Generalized anxiety disorder +Depression. Problem stable.  Continue current medications and diet appropriate for this condition.  We have reviewed our general long term plan for this problem and also reviewed symptoms and signs that should prompt the patient to call or return to the office.   Insomnia Stable.  Continue Ambien CR 12.5mg  qhs prn.  Rx given today for #30, with 5 RFs.  Right hip pain Persistent/recurrent trochanteric pain syndrome. Initially good response to recent hip injections of some kind (not steroid per pt) by Dr. Ethelene Hal, her orthopedist. Now pain is back at same level as before.  Recently renewed rx for her tramadol--she takes 2 bid most days. We discussed the possibility of getting a second opinion at a tertiary care center since her case is so atypical.  She will keep her next f/u with Dr. Ethelene Hal, which is scheduled for next month.  After this, she'll call with report and whether or not she wants referral or not.    FOLLOW UP: Return in about 6 months (around 04/05/2012) for f/u chronic hip pain, anxiety, insomnia.

## 2011-10-04 NOTE — Assessment & Plan Note (Signed)
Persistent/recurrent trochanteric pain syndrome. Initially good response to recent hip injections of some kind (not steroid per pt) by Dr. Ethelene Hal, her orthopedist. Now pain is back at same level as before.  Recently renewed rx for her tramadol--she takes 2 bid most days. We discussed the possibility of getting a second opinion at a tertiary care center since her case is so atypical.  She will keep her next f/u with Dr. Ethelene Hal, which is scheduled for next month.  After this, she'll call with report and whether or not she wants referral or not.

## 2011-10-04 NOTE — Assessment & Plan Note (Signed)
Stable.  Continue Ambien CR 12.5mg  qhs prn.  Rx given today for #30, with 5 RFs.

## 2011-10-08 ENCOUNTER — Other Ambulatory Visit: Payer: Self-pay | Admitting: *Deleted

## 2011-10-08 MED ORDER — CITALOPRAM HYDROBROMIDE 40 MG PO TABS: 40.0000 mg | ORAL_TABLET | Freq: Every day | ORAL | Status: DC

## 2011-10-08 NOTE — Telephone Encounter (Signed)
Faxed refill request received from pharmacy for CITALOPRAM Last filled by MD on 06/26/11, #90 X 0 Last seen on 10/04/11 Follow up in 6 months. RX sent,

## 2011-10-24 ENCOUNTER — Other Ambulatory Visit: Payer: BC Managed Care – PPO

## 2011-10-30 ENCOUNTER — Other Ambulatory Visit: Payer: BC Managed Care – PPO

## 2011-11-05 ENCOUNTER — Other Ambulatory Visit: Payer: Self-pay | Admitting: Family Medicine

## 2011-11-05 DIAGNOSIS — M25551 Pain in right hip: Secondary | ICD-10-CM

## 2011-11-05 MED ORDER — TRAMADOL HCL 50 MG PO TABS: ORAL_TABLET | ORAL | Status: DC

## 2011-11-05 NOTE — Telephone Encounter (Signed)
Refill request for TRAMADOL Last filled- 10/02/11, #120 X 0 Last seen- 10/04/11 Follow up - 6 MONTHS. Please advise refills.

## 2011-11-05 NOTE — Telephone Encounter (Signed)
Referral order placed.--PM

## 2011-11-05 NOTE — Telephone Encounter (Signed)
Pt voices understanding and states "that's not a problem".  She would also like to move forward with referral to [facility] for hip pain that you guys talked about earlier.

## 2011-11-05 NOTE — Telephone Encounter (Signed)
I'm authorizing RF of tramadol for her, but tell her I need her to come by and sign a drug contract so we can have appropriate documentation in her chart that we are managing her pain responsibly and that we are going to monitor things as per routine chronic pain management.--thx

## 2011-11-18 ENCOUNTER — Encounter: Payer: Self-pay | Admitting: Family Medicine

## 2011-12-06 ENCOUNTER — Other Ambulatory Visit: Payer: BC Managed Care – PPO

## 2011-12-06 ENCOUNTER — Encounter: Payer: Self-pay | Admitting: *Deleted

## 2011-12-11 ENCOUNTER — Other Ambulatory Visit: Payer: Self-pay | Admitting: *Deleted

## 2011-12-11 NOTE — Telephone Encounter (Signed)
Message left by patient that RX's were not at pharmacy.  I called and left a message on cell voicemail that I forgot to send RX's to pharmacy on Friday and apologized for my oversite.  Pt was in office for lab visit and spoke to me while completing controlled substance contract.  Asked pt to give me a call on Thursday and I would get those refills done for her.

## 2011-12-12 MED ORDER — TRAMADOL HCL 50 MG PO TABS: ORAL_TABLET | ORAL | Status: DC

## 2011-12-12 MED ORDER — CLONAZEPAM 0.5 MG PO TABS: ORAL_TABLET | ORAL | Status: DC

## 2011-12-12 NOTE — Telephone Encounter (Signed)
RX faxed--clonazepam.

## 2011-12-12 NOTE — Telephone Encounter (Signed)
Refill request for TRAMADOL Last filled- 11/05/11, #120 X 0 Pt has appt on 11/12 with Dr. Caswell Corwin at [facility] for hip pain.  Refill request for CLONAZEPAM Last filled- 05/23/11, #30 X 2  Last seen- 10/04/11 Follow up - 6 MONTHS Pt signed controlled substance contract on 10/18. Please advise refills.

## 2011-12-13 ENCOUNTER — Other Ambulatory Visit: Payer: BC Managed Care – PPO

## 2011-12-17 ENCOUNTER — Encounter: Payer: Self-pay | Admitting: Emergency Medicine

## 2011-12-17 ENCOUNTER — Other Ambulatory Visit (INDEPENDENT_AMBULATORY_CARE_PROVIDER_SITE_OTHER): Payer: BC Managed Care – PPO

## 2011-12-17 DIAGNOSIS — Z309 Encounter for contraceptive management, unspecified: Secondary | ICD-10-CM | POA: Insufficient documentation

## 2011-12-17 LAB — POCT URINE PREGNANCY: Preg Test, Ur: NEGATIVE

## 2011-12-25 ENCOUNTER — Ambulatory Visit (INDEPENDENT_AMBULATORY_CARE_PROVIDER_SITE_OTHER): Payer: BC Managed Care – PPO | Admitting: *Deleted

## 2011-12-25 DIAGNOSIS — Z309 Encounter for contraceptive management, unspecified: Secondary | ICD-10-CM

## 2011-12-25 MED ORDER — MEDROXYPROGESTERONE ACETATE 150 MG/ML IM SUSP
150.0000 mg | Freq: Once | INTRAMUSCULAR | Status: AC
  Administered 2011-12-25: 150 mg via INTRAMUSCULAR

## 2011-12-25 NOTE — Patient Instructions (Signed)
Return between 03/11/12 and 03/25/12 for next injection.

## 2011-12-25 NOTE — Progress Notes (Signed)
Pt presents for first DEPO PROVERA injection.  She has had 2 negative pregnancy tests and has again verified that she has not had sexual intercourse in over two years.  Pt tolerated injection well in left deltoid.  As pt has been on this medication before, per Dr. Milinda Cave pt does not have to wait in our office 15 minutes.  Pt feels well.

## 2012-01-15 ENCOUNTER — Other Ambulatory Visit: Payer: Self-pay | Admitting: *Deleted

## 2012-01-15 MED ORDER — TRAMADOL HCL 50 MG PO TABS: ORAL_TABLET | ORAL | Status: DC

## 2012-01-15 NOTE — Telephone Encounter (Signed)
eScribe request for refill on TRAMADOL Last filled - 12/12/11, #120 X 0 Last seen on - 10/04/11 Follow up - 6 MONTHS Please advise refills.

## 2012-02-20 ENCOUNTER — Other Ambulatory Visit: Payer: Self-pay | Admitting: Family Medicine

## 2012-02-20 NOTE — Telephone Encounter (Signed)
Refill request for TRAMADOL Last filled 01/15/12, #120 x 1 Last seen on - 10/04/11  Follow up - 6 MONTHS  Per Jonny Ruiz at pharmacy pt has refill on file.  He will process.

## 2012-03-11 ENCOUNTER — Ambulatory Visit: Payer: BC Managed Care – PPO

## 2012-03-19 ENCOUNTER — Telehealth: Payer: Self-pay | Admitting: *Deleted

## 2012-03-19 NOTE — Telephone Encounter (Signed)
Pt was scheduled to have depo injection on 1/29, but was unable to come in due to snow.  Advised she is still in her window.  Also advised she does not need script as we have med here.  She is on her way out of town and will call on Monday for nurse appt.

## 2012-03-23 ENCOUNTER — Ambulatory Visit (INDEPENDENT_AMBULATORY_CARE_PROVIDER_SITE_OTHER): Payer: BC Managed Care – PPO | Admitting: *Deleted

## 2012-03-23 DIAGNOSIS — Z309 Encounter for contraceptive management, unspecified: Secondary | ICD-10-CM

## 2012-03-23 MED ORDER — MEDROXYPROGESTERONE ACETATE 150 MG/ML IM SUSP
150.0000 mg | Freq: Once | INTRAMUSCULAR | Status: AC
Start: 2012-03-23 — End: 2012-03-23
  Administered 2012-03-23: 150 mg via INTRAMUSCULAR

## 2012-03-23 NOTE — Progress Notes (Signed)
Pt presented for Depo Provera injection.  Pt tolerated well in right deltoid.  Pt will return between 06/08/12 and 06/22/12 for next injection.

## 2012-03-24 ENCOUNTER — Other Ambulatory Visit: Payer: Self-pay | Admitting: *Deleted

## 2012-03-24 MED ORDER — ZOLPIDEM TARTRATE ER 12.5 MG PO TBCR: 12.5000 mg | EXTENDED_RELEASE_TABLET | Freq: Every evening | ORAL | Status: DC | PRN

## 2012-03-24 MED ORDER — CLONAZEPAM 0.5 MG PO TABS: ORAL_TABLET | ORAL | Status: DC

## 2012-03-24 MED ORDER — TRAMADOL HCL 50 MG PO TABS: ORAL_TABLET | ORAL | Status: DC

## 2012-03-24 NOTE — Telephone Encounter (Signed)
Refill request for ZOLPIDEM, CLONAZEPAM, TRAMADOL Last seen- 10/04/11 Follow up - 6 MONTHS Please advise refills.  Thanks.

## 2012-04-03 ENCOUNTER — Ambulatory Visit: Payer: BC Managed Care – PPO | Admitting: Family Medicine

## 2012-04-03 DIAGNOSIS — Z0289 Encounter for other administrative examinations: Secondary | ICD-10-CM

## 2012-04-20 ENCOUNTER — Telehealth: Payer: Self-pay | Admitting: Family Medicine

## 2012-04-20 MED ORDER — CITALOPRAM HYDROBROMIDE 40 MG PO TABS: 40.0000 mg | ORAL_TABLET | Freq: Every day | ORAL | Status: DC

## 2012-04-20 NOTE — Telephone Encounter (Signed)
Refill request for TRAMADOL Last filled- 03/24/12, #120 X 1 NO RX SENT.  PT SHOULD HAVE REFILL AT PHARMACY.  Refill request for CITALOPRAM Last filled- 10/08/11, #90 X 1 Last seen- 10/03/12 Follow up - 6 MONTHS.  No appt in EPIC.  30 day supply sent.

## 2012-05-21 ENCOUNTER — Telehealth: Payer: Self-pay | Admitting: Emergency Medicine

## 2012-05-22 MED ORDER — CITALOPRAM HYDROBROMIDE 40 MG PO TABS: 40.0000 mg | ORAL_TABLET | Freq: Every day | ORAL | Status: DC

## 2012-05-22 NOTE — Telephone Encounter (Signed)
Refill request for CITALOPRAM Last filled- 04/2012 Last seen- 09/2011 Pt is due follow up.  I spoke to patient and scheduled her for 06/02/12 @815 .  She understands she needs OV before any additional meds can be given.

## 2012-05-25 ENCOUNTER — Encounter: Payer: Self-pay | Admitting: Family Medicine

## 2012-05-25 ENCOUNTER — Ambulatory Visit (INDEPENDENT_AMBULATORY_CARE_PROVIDER_SITE_OTHER): Payer: BC Managed Care – PPO | Admitting: Family Medicine

## 2012-05-25 VITALS — BP 90/58 | HR 91 | Temp 98.1°F | Ht 65.0 in | Wt 110.0 lb

## 2012-05-25 DIAGNOSIS — F411 Generalized anxiety disorder: Secondary | ICD-10-CM

## 2012-05-25 DIAGNOSIS — M25551 Pain in right hip: Secondary | ICD-10-CM

## 2012-05-25 DIAGNOSIS — M25559 Pain in unspecified hip: Secondary | ICD-10-CM

## 2012-05-25 MED ORDER — TRAMADOL HCL 50 MG PO TABS: ORAL_TABLET | ORAL | Status: DC

## 2012-05-25 MED ORDER — CITALOPRAM HYDROBROMIDE 40 MG PO TABS: ORAL_TABLET | ORAL | Status: DC

## 2012-05-25 MED ORDER — CLONAZEPAM 0.5 MG PO TABS: ORAL_TABLET | ORAL | Status: DC

## 2012-05-25 NOTE — Progress Notes (Signed)
OFFICE NOTE  05/25/2012  CC:  Chief Complaint  Patient presents with  . Follow-up    Medications     HPI: Patient is a 27 y.o. Caucasian female who is here for routine med follow up for chronic hip pain, anxiety, and insomnia. She still has the same pain in right hip.  It seems to respond to a "bearable" level and allow her to function throughout her day and allow her to work but still she says she is miserable.  She does the stretches she has been taught by PT in the past--no help per pt.  She feels like she is at a standstill in her treatment for this--seems that ortho she has seen locally has done all they can do and they do not recommend any further surgery. She requests referral for 2nd ortho opinion.  Tramadol, anywhere from 4-8 tabs per day.  Says her life still feels overwhelming regarding her anxiety and stress.  Says the now she is even having periods out of the blue that are approaching panic. Sometimes just has to leave wherever she is so she doesn't have an outright nervous breakdown. Taking cital 40 qd and clonaz 0.5 qhs---rarely a daytime dose.   Pertinent PMH:  Past Medical History  Diagnosis Date  . HSV infection     Genital clx pos HSV 2--2007.  . Drug overdose 2004    Xanax  . Anxiety     Failed buspar  . Brain injury 27 yrs old    fell off babysitters cabinet, and on a separate occasion she fell down a flight of stairs  . Closed head injury About age 41 yrs    ICH per pt, with seizure disorder for appox 2 yrs afterward.  No significant residual disability.  . Seizure     for 2 yrs after closed head injury/ICH  . NSVD (normal spontaneous vaginal delivery)     x 2.  One was given up for adoption (2008).  . Right hip pain 05/2009    MRI 10/12/09: Femoral head and neck stress injury plus small acetabular labral tear  . History of chlamydia   . H/O varicella   . Abdominal pain, lower 01/15/05    Postpartum  . H/O vaginitis 06/04/07  . Nausea 10/22/07    With mense   . BV (bacterial vaginosis) 11/24/07   . Candida vaginitis 8/26/ 10   . Hx: UTI (urinary tract infection) 09/06/10  . Depression     Lexapro--loss of sex drive.  Cymbalta--oversedated  . Decreased appetite 06/04/07    MEDS:  Outpatient Prescriptions Prior to Visit  Medication Sig Dispense Refill  . calcium carbonate (OS-CAL) 600 MG TABS Take 600 mg by mouth daily.        . citalopram (CELEXA) 40 MG tablet Take 1 tablet (40 mg total) by mouth daily. MUST HAVE APPT FOR MORE REFILLS.  30 tablet  0  . clonazePAM (KLONOPIN) 0.5 MG tablet 1-2 tabs po q8-12 hrs prn severe anxiety  30 tablet  2  . MedroxyPROGESTERone Acetate (DEPO-PROVERA IM) Inject into the muscle every 3 (three) months.        . traMADol (ULTRAM) 50 MG tablet 1-2 tabs po bid prn pain  120 tablet  1  . valACYclovir (VALTREX) 1000 MG tablet Take 1 tablet (1,000 mg total) by mouth daily as needed.  30 tablet  0  . zolpidem (AMBIEN CR) 12.5 MG CR tablet Take 1 tablet (12.5 mg total) by mouth at bedtime as needed  for sleep.  30 tablet  0   No facility-administered medications prior to visit.    PE: Blood pressure 90/58, pulse 91, temperature 98.1 F (36.7 C), temperature source Oral, height 5\' 5"  (1.651 m), weight 110 lb (49.896 kg), SpO2 96.00%. Gen: Alert, well appearing.  Patient is oriented to person, place, time, and situation. Affect: pleasant but stress, and when talking about her pain and stress she begins to cry intermittently. No further exam today.  IMPRESSION AND PLAN:  Right hip pain Stress fracture 2011, then persistent/recurrent trochanteric pain syndrome.  Cortisone injection x 1 by orthopedist made things worse short term per pt report.  Takes 2 tramadol tabs per day and only occasional NSAID for pain control (for the last year or so, as of 06/2010).  Pain med changed to vicodin 5/325, 1-2 tabs qd prn pain but this didn't last long so pt switched back to tramadol Pain is persistent, patient extremely frustrated  with the chronicity of this and wants referral for second opinion to another orthopedist.   This appt has already been arranged as of the end of today's visit: [facility] ortho, 06/30/12 at 3 pm. Tramadol 50mg , 1-2 q6h prn, #240 rx'd today.  Generalized anxiety disorder Increase citalopram to 60mg  qd 1 and 1/2 of the 40mg  tabs qd). Increase clonazepam to 0.5mg  bid, at least short term.  Therapeutic expectations and side effect profile of medication discussed today.  Patient's questions answered.    An After Visit Summary was printed and given to the patient.  Spent 30 min with pt today, with >50% of this time spent in counseling and care coordination regarding the above problems.   FOLLOW UP: 3 mo

## 2012-05-30 NOTE — Assessment & Plan Note (Signed)
Stress fracture 2011, then persistent/recurrent trochanteric pain syndrome.  Cortisone injection x 1 by orthopedist made things worse short term per pt report.  Takes 2 tramadol tabs per day and only occasional NSAID for pain control (for the last year or so, as of 06/2010).  Pain med changed to vicodin 5/325, 1-2 tabs qd prn pain but this didn't last long so pt switched back to tramadol Pain is persistent, patient extremely frustrated with the chronicity of this and wants referral for second opinion to another orthopedist.   This appt has already been arranged as of the end of today's visit: [facility] ortho, 06/30/12 at 3 pm. Tramadol 50mg , 1-2 q6h prn, #240 rx'd today.

## 2012-05-30 NOTE — Assessment & Plan Note (Signed)
Increase citalopram to 60mg  qd 1 and 1/2 of the 40mg  tabs qd). Increase clonazepam to 0.5mg  bid, at least short term.  Therapeutic expectations and side effect profile of medication discussed today.  Patient's questions answered.

## 2012-06-02 ENCOUNTER — Ambulatory Visit: Payer: BC Managed Care – PPO | Admitting: Family Medicine

## 2012-06-12 ENCOUNTER — Other Ambulatory Visit: Payer: Self-pay | Admitting: Family Medicine

## 2012-06-15 NOTE — Telephone Encounter (Signed)
Refill request for Celexa Last filled-05/25/12, #45 x0 Last seen-05/25/12 Follow up -06/19/12 Please advise refill?

## 2012-06-19 ENCOUNTER — Other Ambulatory Visit: Payer: Self-pay | Admitting: *Deleted

## 2012-06-19 ENCOUNTER — Ambulatory Visit (INDEPENDENT_AMBULATORY_CARE_PROVIDER_SITE_OTHER): Payer: BC Managed Care – PPO | Admitting: *Deleted

## 2012-06-19 DIAGNOSIS — Z309 Encounter for contraceptive management, unspecified: Secondary | ICD-10-CM

## 2012-06-19 DIAGNOSIS — IMO0001 Reserved for inherently not codable concepts without codable children: Secondary | ICD-10-CM

## 2012-06-19 DIAGNOSIS — Z79899 Other long term (current) drug therapy: Secondary | ICD-10-CM

## 2012-06-19 MED ORDER — ZOLPIDEM TARTRATE ER 12.5 MG PO TBCR: 12.5000 mg | EXTENDED_RELEASE_TABLET | Freq: Every evening | ORAL | Status: DC | PRN

## 2012-06-19 MED ORDER — CITALOPRAM HYDROBROMIDE 40 MG PO TABS: ORAL_TABLET | ORAL | Status: DC

## 2012-06-19 MED ORDER — CLONAZEPAM 0.5 MG PO TABS: ORAL_TABLET | ORAL | Status: DC

## 2012-06-19 MED ORDER — METHYLPREDNISOLONE ACETATE 40 MG/ML IJ SUSP
150.0000 mg | Freq: Once | INTRAMUSCULAR | Status: AC
  Administered 2012-06-19: 150 mg via INTRAMUSCULAR

## 2012-06-19 NOTE — Progress Notes (Signed)
Patient in office for Depo injection; requested Rx for Celexa [dosing increased & new Rx given at 04.14.14 OV #45x0] & Zolpidem [last Rx 02.11.14 #30x0], stating that she was out of medication & had been requesting refills from [facility] [pt visually upset & tearing], also pt reported that her Clonazepam Rx had "been destroyed in her car" [Rx given at 04.14.14 OV #60x2].  Rx printed for Zolpidem request; request for Celexa too soon & spoke w/provider RE: Clonazepam Rx. Pt declined short-supply prescription for Clonazepam w/stipulation that she must do drug screening at lab today to receive full month Rx, stating that she "had been taking some she had left over from previous prescription" and didn't want "any miscommunication about her lab result. Provider confronted pt as to why she had not divulged this information when request for new Rx was made and she "forgot that she had some leftover that she had taken the last pill of this morning". Pt stated that she will call for Rx request when it has been 30-day period from last Rx date. Called pharmacy to verify last refill on Celexa and was informed that 04.14.14 Rx script had been 'On Hold' at pharmacy since 04.14.14; informed patient of this information given, pt understood. Patient left office, returned inside office [no reason] & then went back to vehicle in parking lot, where she was visibly upset before leaving.

## 2012-06-22 ENCOUNTER — Other Ambulatory Visit: Payer: BC Managed Care – PPO

## 2012-07-13 ENCOUNTER — Ambulatory Visit: Payer: BC Managed Care – PPO

## 2012-07-13 ENCOUNTER — Ambulatory Visit (INDEPENDENT_AMBULATORY_CARE_PROVIDER_SITE_OTHER): Payer: BC Managed Care – PPO | Admitting: *Deleted

## 2012-07-13 DIAGNOSIS — Z23 Encounter for immunization: Secondary | ICD-10-CM

## 2012-07-13 DIAGNOSIS — Z111 Encounter for screening for respiratory tuberculosis: Secondary | ICD-10-CM

## 2012-07-13 NOTE — Progress Notes (Signed)
  Subjective:    Patient ID: Terri [name], female    DOB: 1985/02/19, 27 y.o.   MRN: [id number]  HPI    Review of Systems     Objective:   Physical Exam        Assessment & Plan:  PPD Placement note Terri [name], 27 y.o. female is here today for placement of PPD test Patient presented for PPD placement and tolerated on Left Forearm; informed of CDC regulations of 48-72 hour window for reading and [7] day waiting period if missed/SLS Pt taken PPD test before: yes  PPD placed on 07/13/2012.  Patient advised to return for reading within 48-72 hours.

## 2012-07-16 ENCOUNTER — Encounter: Payer: Self-pay | Admitting: *Deleted

## 2012-07-16 LAB — TB SKIN TEST
Induration: 0 mm
TB Skin Test: NEGATIVE

## 2012-07-22 ENCOUNTER — Other Ambulatory Visit: Payer: Self-pay | Admitting: Family Medicine

## 2012-07-22 NOTE — Telephone Encounter (Addendum)
Refill request for [facility] Last filled by MD on -05.09.14, #12x0 Refill request for TRAMADOL Last Rx by MD on -04.14.14, #240x2 Last seen-05/25/12 F/U- none Please advise refill?

## 2012-07-23 MED ORDER — TRAMADOL HCL 50 MG PO TABS: ORAL_TABLET | ORAL | Status: DC

## 2012-07-23 MED ORDER — CLONAZEPAM 0.5 MG PO TABS: ORAL_TABLET | ORAL | Status: DC

## 2012-08-03 ENCOUNTER — Ambulatory Visit (INDEPENDENT_AMBULATORY_CARE_PROVIDER_SITE_OTHER): Payer: BC Managed Care – PPO | Admitting: Nurse Practitioner

## 2012-08-03 ENCOUNTER — Encounter: Payer: Self-pay | Admitting: Nurse Practitioner

## 2012-08-03 ENCOUNTER — Ambulatory Visit (INDEPENDENT_AMBULATORY_CARE_PROVIDER_SITE_OTHER)
Admission: RE | Admit: 2012-08-03 | Discharge: 2012-08-03 | Disposition: A | Discharge status: Home / Self Care | Payer: BC Managed Care – PPO | Source: Ambulatory Visit | Attending: Nurse Practitioner | Admitting: Nurse Practitioner

## 2012-08-03 VITALS — BP 105/68 | HR 99 | Temp 98.0°F | Resp 18

## 2012-08-03 DIAGNOSIS — S8990XA Unspecified injury of unspecified lower leg, initial encounter: Secondary | ICD-10-CM

## 2012-08-03 DIAGNOSIS — S99921A Unspecified injury of right foot, initial encounter: Secondary | ICD-10-CM

## 2012-08-03 DIAGNOSIS — S99929A Unspecified injury of unspecified foot, initial encounter: Secondary | ICD-10-CM

## 2012-08-03 NOTE — Telephone Encounter (Signed)
I RF'd both of these meds on 07/22/12.

## 2012-08-03 NOTE — Patient Instructions (Signed)
We will call you with xray results and further instructions. In the meantime, ice foot for 10 mins at a time, 4-5 times daily. Take tylenol for pain. If there is no fracture, you may use ibuprophen.

## 2012-08-04 NOTE — Progress Notes (Signed)
  Subjective:    Patient ID: Terri [name], female    DOB: 11/22/1985, 27 y.o.   MRN: [id number]  Foot Injury  The incident occurred less than 1 hour ago. The incident occurred at work. The injury mechanism was a direct blow (empty 7' tall cabinet fell on foot). The pain is present in the right foot. The quality of the pain is described as aching. The pain is at a severity of 7/10. The pain has been constant since onset. Associated symptoms include an inability to bear weight (due to pain). She reports no foreign bodies present. The symptoms are aggravated by weight bearing, palpation and movement. She has tried NSAIDs, immobilization and non-weight bearing (400 mg) for the symptoms. The treatment provided no relief.      Review of Systems  Constitutional: Positive for activity change (not bearing weight on r foot since injury).  Respiratory: Negative for shortness of breath.   Musculoskeletal: Positive for gait problem (limping due to R foot injury & pain).  Skin: Positive for color change (bluish bruise) and wound (superficial abrasion and bruise top of R foot ).  Psychiatric/Behavioral:       Hx of anxiety & panic attacks       Objective:   Physical Exam  Vitals reviewed. Constitutional: She is oriented to person, place, and time. She appears well-developed and well-nourished. She appears distressed (in wheelchair, very nervous about having foot examined).  HENT:  Head: Normocephalic and atraumatic.  Eyes: Conjunctivae are normal. Right eye exhibits no discharge. Left eye exhibits no discharge.  Cardiovascular: Normal rate.   Pulmonary/Chest: Effort normal. No respiratory distress.  Musculoskeletal: She exhibits edema (slight edema top of R foot) and tenderness (tender at top of foot & lateral/medial aspect).       Right foot: She exhibits decreased range of motion (due to pain), tenderness, bony tenderness (at area indicated in pic.) and swelling (minimal). She exhibits normal  capillary refill and no deformity.       Feet:  Toes cool bilaterally-pt states toes are always cold. +2 pedal & tibial pulse, brisk capillary nailbed refill. No tender points at ankle or toes or heel.  Neurological: She is alert and oriented to person, place, and time.  Skin: Skin is warm and dry.  Psychiatric: She has a normal mood and affect. Her behavior is normal. Thought content normal.  Anxious about moving foot or having foot examined          Assessment & Plan:  1. Foot trauma, right, initial encounter 3 view Xray shows no fracture or displacement. Lateral view is incomplete. Will f/u w/pt in 48 hours re progress. - DG Foot Complete Right; Future Pt was Advised by phone to keep foot wrapped in ace bandage for support, weight bearing as tolerated, continue to ice, OK to use ibuprophen as no fracture apparent.

## 2012-08-06 ENCOUNTER — Telehealth: Payer: Self-pay | Admitting: Emergency Medicine

## 2012-08-10 ENCOUNTER — Telehealth: Payer: Self-pay | Admitting: Family Medicine

## 2012-08-10 MED ORDER — CITALOPRAM HYDROBROMIDE 40 MG PO TABS: ORAL_TABLET | ORAL | Status: DC

## 2012-08-10 NOTE — Telephone Encounter (Signed)
May do rf as prev rx'd for 30 day supply with 1 additional RF.  Patient needs routine office f/u prior to FURTHER Rfs.  Thks

## 2012-08-10 NOTE — Telephone Encounter (Signed)
Request Citalopram 40mg  sig 1.5 tabs daily last refilled 06/19/12.  No upcoming appt.  Please advise.

## 2012-08-10 NOTE — Addendum Note (Signed)
Addended by: Eulah Pont on: 08/10/2012 01:34 PM   Modules accepted: Orders

## 2012-09-18 ENCOUNTER — Telehealth: Payer: Self-pay | Admitting: Family Medicine

## 2012-09-18 ENCOUNTER — Ambulatory Visit (INDEPENDENT_AMBULATORY_CARE_PROVIDER_SITE_OTHER): Payer: BC Managed Care – PPO | Admitting: Family Medicine

## 2012-09-18 DIAGNOSIS — IMO0001 Reserved for inherently not codable concepts without codable children: Secondary | ICD-10-CM

## 2012-09-18 DIAGNOSIS — Z309 Encounter for contraceptive management, unspecified: Secondary | ICD-10-CM

## 2012-09-18 MED ORDER — ZOLPIDEM TARTRATE ER 12.5 MG PO TBCR: 12.5000 mg | EXTENDED_RELEASE_TABLET | Freq: Every evening | ORAL | Status: DC | PRN

## 2012-09-18 MED ORDER — MEDROXYPROGESTERONE ACETATE 150 MG/ML IM SUSP
150.0000 mg | Freq: Once | INTRAMUSCULAR | Status: AC
  Administered 2012-09-18: 150 mg via INTRAMUSCULAR

## 2012-09-18 NOTE — Telephone Encounter (Signed)
Ambien rx printed. She should still have one RF on her tramadol.   Needs f/u sometime in the next 2 months.   Pls get records from [facility] ortho (Dr. Renella Cunas).  --thx

## 2012-09-18 NOTE — Telephone Encounter (Signed)
Patient aware and will check with the pharmacy about her refills on tramadol.  I faxed her ortho office for records.

## 2012-09-18 NOTE — Telephone Encounter (Signed)
Patient wants refill on her tramadol, last filled 07/22/12 x 2 refills.  She also wants her Remus Loffler refilled, last filled 06/19/12.  Last office visit was 05/25/12 with you and she saw Layne on 08/03/12.  Please advise refills.

## 2012-09-24 ENCOUNTER — Encounter: Payer: Self-pay | Admitting: Family Medicine

## 2012-11-24 ENCOUNTER — Ambulatory Visit (INDEPENDENT_AMBULATORY_CARE_PROVIDER_SITE_OTHER): Payer: BC Managed Care – PPO | Admitting: Family Medicine

## 2012-11-24 ENCOUNTER — Encounter: Payer: Self-pay | Admitting: Family Medicine

## 2012-11-24 VITALS — BP 110/70 | HR 80 | Temp 99.6°F | Resp 18 | Ht 65.0 in | Wt 113.0 lb

## 2012-11-24 DIAGNOSIS — G4440 Drug-induced headache, not elsewhere classified, not intractable: Secondary | ICD-10-CM

## 2012-11-24 DIAGNOSIS — F341 Dysthymic disorder: Secondary | ICD-10-CM

## 2012-11-24 DIAGNOSIS — G8929 Other chronic pain: Secondary | ICD-10-CM

## 2012-11-24 DIAGNOSIS — G43009 Migraine without aura, not intractable, without status migrainosus: Secondary | ICD-10-CM

## 2012-11-24 DIAGNOSIS — M25551 Pain in right hip: Secondary | ICD-10-CM

## 2012-11-24 DIAGNOSIS — Z23 Encounter for immunization: Secondary | ICD-10-CM

## 2012-11-24 DIAGNOSIS — M25559 Pain in unspecified hip: Secondary | ICD-10-CM

## 2012-11-24 DIAGNOSIS — G43109 Migraine with aura, not intractable, without status migrainosus: Secondary | ICD-10-CM

## 2012-11-24 DIAGNOSIS — G4700 Insomnia, unspecified: Secondary | ICD-10-CM

## 2012-11-24 DIAGNOSIS — G43909 Migraine, unspecified, not intractable, without status migrainosus: Secondary | ICD-10-CM

## 2012-11-24 DIAGNOSIS — F418 Other specified anxiety disorders: Secondary | ICD-10-CM

## 2012-11-24 MED ORDER — CITALOPRAM HYDROBROMIDE 40 MG PO TABS: ORAL_TABLET | ORAL | Status: DC

## 2012-11-24 MED ORDER — RIZATRIPTAN BENZOATE 10 MG PO TABS: ORAL_TABLET | ORAL | Status: DC

## 2012-11-24 MED ORDER — HYDROCODONE-ACETAMINOPHEN 10-325 MG PO TABS: ORAL_TABLET | ORAL | Status: DC

## 2012-11-24 NOTE — Patient Instructions (Signed)
Take tramadol 1 tab twice a day x 7d, then 1 tab once daily for 7d, then stop.

## 2012-11-24 NOTE — Progress Notes (Addendum)
OFFICE NOTE  11/24/2012  CC:  Chief Complaint  Patient presents with  . Hip Pain    wants to discuss different pain management options     HPI: Patient is a 27 y.o. Caucasian female who is here for 6 mo f/u dep/anx + hx of chronic right hip pain of unclear etiology. Has been off of citalopram for about 1 mo and feels like anxiety and mood are stable.  Says she rarely takes her clonazepam.  Rarely takes zolpidem CR.  Has been chronically taking tramadol for pain control and has had to increase her dose over time due to tolerance. Says she is currently doing PT under the instruction of Dr. Caswell Corwin at [facility]. Says tramadol use has gone up b/c PT makes her more sore, although it is increasing her mobility. She takes 4 tramadol at one time sometimes.  She usually takes 5 total tabs throughout her entire day, though. Has HA's daily now, usually localized to one side and throbbing.  Sometimes has n/v with it.  Typically start in evening or she wakes up with one in the morning.  Some photophobia/phonophobia.  No visual or other type of aura.  Pertinent PMH:  Past Medical History  Diagnosis Date  . HSV infection     Genital clx pos HSV 2--2007.  . Drug overdose 2004    Xanax  . Anxiety     Failed buspar  . Brain injury 27 yrs old    fell off babysitters cabinet, and on a separate occasion she fell down a flight of stairs  . Closed head injury About age 61 yrs    ICH per pt, with seizure disorder for appox 2 yrs afterward.  No significant residual disability.  . Seizure     for 2 yrs after closed head injury/ICH  . NSVD (normal spontaneous vaginal delivery)     x 2.  One was given up for adoption (2008).  . Right hip pain 05/2009    MRI 10/12/09: Femoral head and neck stress injury plus small acetabular labral tear.  Marland Kitchen History of chlamydia   . H/O varicella   . Abdominal pain, lower 01/15/05    Postpartum  . H/O vaginitis 06/04/07  . Nausea 10/22/07    With mense  . BV (bacterial  vaginosis) 11/24/07   . Candida vaginitis 8/26/ 10   . Hx: UTI (urinary tract infection) 09/06/10  . Depression     Lexapro--loss of sex drive.  Cymbalta--oversedated  . Decreased appetite 06/04/07   Past Surgical History  Procedure Laterality Date  . Wisdom tooth extraction    . Mole removal      on back  . Hip surgery  10/10/2010    Right hip GT bursectomy and osteophytectomy for intractable right greater troch pain (Dr. Despina Hick).  [facility] second opinion 06/2012--resulted in repeat MRI, then right hip IA injection that gave mild short term response, then was referred to PT with iontophoresis (as of 09/07/12)--was told to f/u in 6 wks.    MEDS: Not taking citalopram as listed below Outpatient Prescriptions Prior to Visit  Medication Sig Dispense Refill  . calcium carbonate (OS-CAL) 600 MG TABS Take 600 mg by mouth daily.        . clonazePAM (KLONOPIN) 0.5 MG tablet 1-2 tabs po q8-12 hrs prn severe anxiety  60 tablet  3  . MedroxyPROGESTERone Acetate (DEPO-PROVERA IM) Inject into the muscle every 3 (three) months.        . traMADol (ULTRAM) 50  MG tablet 1-2 tabs po bid prn pain  240 tablet  2  . valACYclovir (VALTREX) 1000 MG tablet Take 1 tablet (1,000 mg total) by mouth daily as needed.  30 tablet  0  . zolpidem (AMBIEN CR) 12.5 MG CR tablet Take 1 tablet (12.5 mg total) by mouth at bedtime as needed for sleep.  30 tablet  2  . citalopram (CELEXA) 40 MG tablet 1 and 1/2 tabs po qd  45 tablet  1   No facility-administered medications prior to visit.    PE: Blood pressure 110/70, pulse 80, temperature 99.6 F (37.6 C), temperature source Temporal, resp. rate 18, height 5\' 5"  (1.651 m), weight 113 lb (51.256 kg), SpO2 97.00%. Wt Readings from Last 2 Encounters:  11/24/12 113 lb (51.256 kg)  05/25/12 110 lb (49.896 kg)    Gen: alert, oriented x 4, affect pleasant.  Lucid thinking and conversation noted. HEENT: PERRLA, EOMI.   Neck: no LAD, mass, or thyromegaly. CV: RRR, no m/r/g LUNGS:  CTA bilat, nonlabored. NEURO: no tremor or tics noted on observation.  Coordination intact. CN 2-12 grossly intact bilaterally, strength 5/5 in all extremeties.  No ataxia.   IMPRESSION AND PLAN:  Right hip pain Hx of stress fracture 2011, then persistent/recurrent trochanteric pain syndrome.  Has had multiple cortisone injections. Has also had surgery on her right trochanteric bursa. Currently seeing Dr. Caswell Corwin at [facility] ortho and is in PT but no end in sight for her pain. I recommended she continue seeing Dr. Caswell Corwin, continue PT.   Since she has built up a tolerance to tramadol she needs stronger pain med to function. I recommended she see a pain specialist so she agreed to referral--I ordered referral to Dr. Oneal Grout today. I will bridge her pain meds until she is seeing Dr. Oneal Grout.  I gave hydrocod/apap 10/325, 1/2-1 tab po q6h prn, #60, no RF.    Migraine syndrome This is worsened lately likely by medication overuse with tramadol for her right hip pain.  Start trial of maxalt generic 10mg .  Therapeutic expectations and side effect profile of medication discussed today.  Patient's questions answered.   Depression with anxiety With hx of panic attacks. Doing well currently OFF of citalopram for the last 1 mo. Uses clonaz rarely.  Insomnia Improved. Continue to use zolpidem CR 12.5mg  qhs prn--she uses this rarely per her report today.   An After Visit Summary was printed and given to the patient.  FOLLOW UP: 6 mo f/u anxiety/dep, insomnia, migraines, chronic hip pain

## 2012-11-25 ENCOUNTER — Encounter: Payer: Self-pay | Admitting: Family Medicine

## 2012-11-25 DIAGNOSIS — G43909 Migraine, unspecified, not intractable, without status migrainosus: Secondary | ICD-10-CM | POA: Insufficient documentation

## 2012-11-25 NOTE — Assessment & Plan Note (Signed)
With hx of panic attacks. Doing well currently OFF of citalopram for the last 1 mo. Uses clonaz rarely.

## 2012-11-25 NOTE — Assessment & Plan Note (Signed)
Hx of stress fracture 2011, then persistent/recurrent trochanteric pain syndrome.  Has had multiple cortisone injections. Has also had surgery on her right trochanteric bursa. Currently seeing Dr. Caswell Corwin at [facility] orth[facility] and is in PT but no end in sight for her pain. I recommended she continue seeing Dr. Caswell Corwin, continue PT.   Since she has built up a tolerance to tramadol she needs stronger pain med to function. I recommended she see a pain specialist so she agreed to referral--I ordered referral to Dr. Oneal Grout today. I will bridge her pain meds until she is seeing Dr. Oneal Grout.  I gave hydrocod/apap 10/325, 1/2-1 tab po q6h prn, #60, no RF.

## 2012-11-25 NOTE — Assessment & Plan Note (Signed)
Improved. Continue to use zolpidem CR 12.5mg  qhs prn--she uses this rarely per her report today.

## 2012-11-25 NOTE — Assessment & Plan Note (Signed)
This is worsened lately likely by medication overuse with tramadol for her right hip pain.  Start trial of maxalt generic 10mg .  Therapeutic expectations and side effect profile of medication discussed today.  Patient's questions answered.

## 2012-12-14 ENCOUNTER — Telehealth: Payer: Self-pay | Admitting: Family Medicine

## 2012-12-14 ENCOUNTER — Other Ambulatory Visit: Payer: Self-pay | Admitting: Family Medicine

## 2012-12-14 DIAGNOSIS — G8929 Other chronic pain: Secondary | ICD-10-CM

## 2012-12-14 DIAGNOSIS — G894 Chronic pain syndrome: Secondary | ICD-10-CM

## 2012-12-14 NOTE — Telephone Encounter (Signed)
Please advise 

## 2012-12-14 NOTE — Telephone Encounter (Signed)
She has seen multiple ortho MD's--none seem to be able to help. I'll do a referral to [facility] in [place].  Pls let pt know.-thx

## 2012-12-14 NOTE — Telephone Encounter (Signed)
Per Dr. Jeani Hawking office there is nothing she can do for the patient, she recommends we refer to ortho

## 2012-12-17 NOTE — Telephone Encounter (Signed)
Patient aware.  She is seeing Roselind Messier @ [facility] on 12/28/12.

## 2013-01-11 ENCOUNTER — Ambulatory Visit: Payer: BC Managed Care – PPO

## 2013-01-19 ENCOUNTER — Ambulatory Visit (INDEPENDENT_AMBULATORY_CARE_PROVIDER_SITE_OTHER): Payer: BC Managed Care – PPO | Admitting: Family Medicine

## 2013-01-19 DIAGNOSIS — Z309 Encounter for contraceptive management, unspecified: Secondary | ICD-10-CM

## 2013-01-19 DIAGNOSIS — IMO0001 Reserved for inherently not codable concepts without codable children: Secondary | ICD-10-CM

## 2013-01-19 DIAGNOSIS — N912 Amenorrhea, unspecified: Secondary | ICD-10-CM

## 2013-01-19 LAB — POCT URINE PREGNANCY: Preg Test, Ur: NEGATIVE

## 2013-01-19 MED ORDER — MEDROXYPROGESTERONE ACETATE 150 MG/ML IM SUSP
150.0000 mg | Freq: Once | INTRAMUSCULAR | Status: AC
  Administered 2013-01-19: 150 mg via INTRAMUSCULAR

## 2013-01-19 NOTE — Progress Notes (Deleted)
   Subjective:    Patient ID: Terri [name], female    DOB: 1985-07-11, 27 y.o.   MRN: [id number]  HPI    Review of Systems     Objective:   Physical Exam        Assessment & Plan:

## 2013-02-02 ENCOUNTER — Other Ambulatory Visit: Payer: Self-pay | Admitting: Family Medicine

## 2013-02-02 MED ORDER — OSELTAMIVIR PHOSPHATE 75 MG PO CAPS: 75.0000 mg | ORAL_CAPSULE | Freq: Every day | ORAL | Status: DC

## 2013-02-02 NOTE — Telephone Encounter (Signed)
Sent in tamiflu per Dr. Milinda Cave.

## 2013-03-12 ENCOUNTER — Encounter: Payer: Self-pay | Admitting: Family Medicine

## 2013-03-12 ENCOUNTER — Telehealth: Payer: Self-pay | Admitting: Family Medicine

## 2013-03-12 NOTE — Telephone Encounter (Signed)
Patient aware.  She will pick up letter on Monday morning.

## 2013-03-12 NOTE — Telephone Encounter (Signed)
Patient needs [facility] wednesday

## 2013-03-12 NOTE — Telephone Encounter (Signed)
Letter printed.

## 2013-03-15 ENCOUNTER — Other Ambulatory Visit (HOSPITAL_COMMUNITY): Payer: Self-pay | Admitting: Pain Medicine

## 2013-03-15 DIAGNOSIS — M545 Low back pain, unspecified: Secondary | ICD-10-CM

## 2013-03-26 ENCOUNTER — Ambulatory Visit (HOSPITAL_COMMUNITY): Payer: BC Managed Care – PPO

## 2013-04-06 ENCOUNTER — Ambulatory Visit: Payer: BC Managed Care – PPO

## 2013-04-12 ENCOUNTER — Ambulatory Visit (INDEPENDENT_AMBULATORY_CARE_PROVIDER_SITE_OTHER): Payer: BC Managed Care – PPO | Admitting: Family Medicine

## 2013-04-12 DIAGNOSIS — IMO0001 Reserved for inherently not codable concepts without codable children: Secondary | ICD-10-CM

## 2013-04-12 DIAGNOSIS — Z309 Encounter for contraceptive management, unspecified: Secondary | ICD-10-CM

## 2013-04-12 MED ORDER — MEDROXYPROGESTERONE ACETATE 150 MG/ML IM SUSP
150.0000 mg | Freq: Once | INTRAMUSCULAR | Status: AC
  Administered 2013-04-12: 150 mg via INTRAMUSCULAR

## 2013-05-15 ENCOUNTER — Other Ambulatory Visit: Payer: Self-pay | Admitting: Family Medicine

## 2013-05-17 NOTE — Telephone Encounter (Signed)
Refill request for clonazepam  Last filled by MD on - 07/23/2012 #60 x3 Last Appt: 11/24/12 Next Appt: 05/25/2013  Refill request for zolpidem Last filled by MD on - 09/18/2012 #30 x2   Please advise refill?

## 2013-05-25 ENCOUNTER — Ambulatory Visit: Payer: BC Managed Care – PPO | Admitting: Family Medicine

## 2013-06-29 ENCOUNTER — Ambulatory Visit (INDEPENDENT_AMBULATORY_CARE_PROVIDER_SITE_OTHER): Payer: BC Managed Care – PPO | Admitting: *Deleted

## 2013-06-29 DIAGNOSIS — IMO0001 Reserved for inherently not codable concepts without codable children: Secondary | ICD-10-CM

## 2013-06-29 DIAGNOSIS — Z309 Encounter for contraceptive management, unspecified: Secondary | ICD-10-CM

## 2013-06-29 MED ORDER — MEDROXYPROGESTERONE ACETATE 150 MG/ML IM SUSP
150.0000 mg | Freq: Once | INTRAMUSCULAR | Status: AC
Start: 2013-06-29 — End: 2013-06-29
  Administered 2013-06-29: 150 mg via INTRAMUSCULAR

## 2013-07-08 ENCOUNTER — Telehealth: Payer: Self-pay | Admitting: Family Medicine

## 2013-07-08 MED ORDER — VALACYCLOVIR HCL 1 G PO TABS: ORAL_TABLET | ORAL | Status: DC

## 2013-07-08 NOTE — Telephone Encounter (Signed)
Rx for acute flare sent and rx for chronic suppression therapy sent.

## 2013-07-08 NOTE — Telephone Encounter (Signed)
Rx refill request came for pt's valtrex.  Medication hasn't been filled since 06/11/11.  Please advise.

## 2013-07-08 NOTE — Telephone Encounter (Signed)
Spoke with pt, she does use it for suppressive therapy but has an outbreak (genital) right now. Can we send in Rx for her outbreak and then an Rx for her to start suppressive therapy? She states the Rx was so expensive so she really wasn't taking them everyday but she has now met her deductible and would like to start again. Thanks

## 2013-07-08 NOTE — Telephone Encounter (Signed)
Spoke with pt, advised both rx's were at the pharmacy. Pt understood.

## 2013-07-08 NOTE — Telephone Encounter (Signed)
Since I have never rx'd this for her, pls ask her specifically what she is using it for (acute treatment of herpes on lips?  Genital herpes recurrence?  Has she used it for daily suppressive therapy in the past.  This info will help me in writing the rx correctly.-thx

## 2013-08-03 ENCOUNTER — Telehealth: Payer: Self-pay | Admitting: Family Medicine

## 2013-08-03 DIAGNOSIS — R197 Diarrhea, unspecified: Secondary | ICD-10-CM

## 2013-08-03 NOTE — Telephone Encounter (Signed)
OK to have her come in and pick up a stool collection kit and I'll order stool tests.

## 2013-08-03 NOTE — Telephone Encounter (Signed)
Patient has experiencing abdominal cramps for 2 days, some diarrhea. Patient states she drinks [employer] bottled water & there has been a recent recall for e coli. Patient would like to know can we test her for e coli?

## 2013-08-03 NOTE — Telephone Encounter (Signed)
Please advise 

## 2013-08-03 NOTE — Telephone Encounter (Signed)
Patient aware to come to office to pick up stool kit.

## 2013-08-06 ENCOUNTER — Other Ambulatory Visit: Payer: Self-pay

## 2013-08-06 DIAGNOSIS — R197 Diarrhea, unspecified: Secondary | ICD-10-CM

## 2013-08-30 ENCOUNTER — Ambulatory Visit (INDEPENDENT_AMBULATORY_CARE_PROVIDER_SITE_OTHER): Payer: BC Managed Care – PPO | Admitting: Family Medicine

## 2013-08-30 DIAGNOSIS — Z111 Encounter for screening for respiratory tuberculosis: Secondary | ICD-10-CM

## 2013-08-31 ENCOUNTER — Ambulatory Visit: Payer: BC Managed Care – PPO

## 2013-09-06 ENCOUNTER — Ambulatory Visit (INDEPENDENT_AMBULATORY_CARE_PROVIDER_SITE_OTHER): Payer: BC Managed Care – PPO | Admitting: *Deleted

## 2013-09-06 DIAGNOSIS — Z111 Encounter for screening for respiratory tuberculosis: Secondary | ICD-10-CM

## 2013-09-09 ENCOUNTER — Ambulatory Visit: Payer: BC Managed Care – PPO | Admitting: Family Medicine

## 2013-09-09 LAB — TB SKIN TEST
Induration: 0 mm
TB Skin Test: NEGATIVE

## 2013-09-14 ENCOUNTER — Ambulatory Visit (INDEPENDENT_AMBULATORY_CARE_PROVIDER_SITE_OTHER): Payer: BC Managed Care – PPO

## 2013-09-14 DIAGNOSIS — Z309 Encounter for contraceptive management, unspecified: Secondary | ICD-10-CM

## 2013-09-14 MED ORDER — MEDROXYPROGESTERONE ACETATE 150 MG/ML IM SUSP
150.0000 mg | Freq: Once | INTRAMUSCULAR | Status: AC
  Administered 2013-09-14: 150 mg via INTRAMUSCULAR

## 2013-11-08 ENCOUNTER — Telehealth: Payer: Self-pay

## 2013-11-08 NOTE — Telephone Encounter (Signed)
Flu vaccine documentation 

## 2013-12-01 ENCOUNTER — Ambulatory Visit: Payer: BC Managed Care – PPO

## 2013-12-02 ENCOUNTER — Ambulatory Visit: Payer: BC Managed Care – PPO

## 2013-12-10 ENCOUNTER — Other Ambulatory Visit: Payer: Self-pay | Admitting: Dermatology

## 2013-12-13 ENCOUNTER — Encounter: Payer: Self-pay | Admitting: Family Medicine

## 2013-12-30 ENCOUNTER — Other Ambulatory Visit: Payer: Self-pay | Admitting: Pain Medicine

## 2013-12-30 ENCOUNTER — Other Ambulatory Visit (HOSPITAL_COMMUNITY): Payer: Self-pay | Admitting: Pain Medicine

## 2013-12-30 ENCOUNTER — Ambulatory Visit: Payer: BC Managed Care – PPO | Admitting: Family Medicine

## 2013-12-30 DIAGNOSIS — M79604 Pain in right leg: Secondary | ICD-10-CM

## 2013-12-30 DIAGNOSIS — M545 Low back pain, unspecified: Secondary | ICD-10-CM

## 2013-12-30 DIAGNOSIS — M169 Osteoarthritis of hip, unspecified: Secondary | ICD-10-CM

## 2013-12-31 ENCOUNTER — Encounter (INDEPENDENT_AMBULATORY_CARE_PROVIDER_SITE_OTHER): Payer: BC Managed Care – PPO

## 2013-12-31 DIAGNOSIS — Z3202 Encounter for pregnancy test, result negative: Secondary | ICD-10-CM

## 2013-12-31 LAB — POCT URINE PREGNANCY: Preg Test, Ur: NEGATIVE

## 2014-01-04 NOTE — Progress Notes (Signed)
This encounter was created in error - please disregard.

## 2014-01-05 ENCOUNTER — Ambulatory Visit (INDEPENDENT_AMBULATORY_CARE_PROVIDER_SITE_OTHER): Payer: BC Managed Care – PPO

## 2014-01-05 DIAGNOSIS — Z308 Encounter for other contraceptive management: Secondary | ICD-10-CM

## 2014-01-05 DIAGNOSIS — Z3042 Encounter for surveillance of injectable contraceptive: Secondary | ICD-10-CM

## 2014-01-05 LAB — POCT URINE PREGNANCY: Preg Test, Ur: NEGATIVE

## 2014-01-05 MED ORDER — MEDROXYPROGESTERONE ACETATE 150 MG/ML IM SUSP
150.0000 mg | Freq: Once | INTRAMUSCULAR | Status: AC
  Administered 2014-01-05: 150 mg via INTRAMUSCULAR

## 2014-01-10 ENCOUNTER — Ambulatory Visit
Admission: RE | Admit: 2014-01-10 | Discharge: 2014-01-10 | Disposition: A | Discharge status: Home / Self Care | Payer: BC Managed Care – PPO | Source: Ambulatory Visit | Attending: Pain Medicine | Admitting: Pain Medicine

## 2014-01-10 DIAGNOSIS — M545 Low back pain, unspecified: Secondary | ICD-10-CM

## 2014-01-10 DIAGNOSIS — M169 Osteoarthritis of hip, unspecified: Secondary | ICD-10-CM

## 2014-01-10 DIAGNOSIS — M79604 Pain in right leg: Secondary | ICD-10-CM

## 2014-01-11 ENCOUNTER — Other Ambulatory Visit: Payer: BC Managed Care – PPO

## 2014-01-14 ENCOUNTER — Ambulatory Visit (INDEPENDENT_AMBULATORY_CARE_PROVIDER_SITE_OTHER): Payer: BC Managed Care – PPO | Admitting: Family Medicine

## 2014-01-14 ENCOUNTER — Encounter: Payer: Self-pay | Admitting: Family Medicine

## 2014-01-14 VITALS — BP 106/68 | HR 73 | Temp 99.0°F | Resp 18 | Ht 65.0 in | Wt 129.0 lb

## 2014-01-14 DIAGNOSIS — M546 Pain in thoracic spine: Secondary | ICD-10-CM

## 2014-01-14 NOTE — Progress Notes (Signed)
OFFICE NOTE  01/14/2014  CC:  Chief Complaint  Patient presents with  . Back Pain    x 1 month   HPI: Patient is a 28 y.o. Caucasian female who is here for right scapular pain for the last month.  Worse with rotation of torso and with reaching/turning.  The pain is constant/mild when sitting still/upright, worsened a lot by any movement and by lying on stomach or her back.  No radiation of the pain.  No ice or heat or massage have been tried. She says she stopped taking the vicodin for her hip, hasn't tried it for this pain either.  Ibuprof no help.   Pertinent PMH:  Past medical, surgical, social, and family history reviewed and no changes are noted since last office visit.  MEDS:  Outpatient Prescriptions Prior to Visit  Medication Sig Dispense Refill  . calcium carbonate (OS-CAL) 600 MG TABS Take 600 mg by mouth daily.      . clonazePAM (KLONOPIN) 0.5 MG tablet TAKE 1 TO 2 TABLETS BY MOUTH EVERY 8-12 HOURS AS NEEDED FOR ANXIETY 60 tablet 3  . HYDROcodone-acetaminophen (NORCO) 10-325 MG per tablet 1/2-1 tab po q6h prn pain in hip 60 tablet 0  . MedroxyPROGESTERone Acetate (DEPO-PROVERA IM) Inject into the muscle every 3 (three) months.      . rizatriptan (MAXALT) 10 MG tablet May repeat in 2 hours if needed--max of 30mg  in 24h period. 10 tablet 2  . valACYclovir (VALTREX) 1000 MG tablet 1 tab po qd 30 tablet 11  . zolpidem (AMBIEN CR) 12.5 MG CR tablet TAKE 1 TABLET BY MOUTH AT BEDTIME AS NEEDED FOR SLEEP 30 tablet 3  . valACYclovir (VALTREX) 1000 MG tablet 2 tabs po q12 hours x 2 doses (Patient not taking: Reported on 01/14/2014) 4 tablet 6  . oseltamivir (TAMIFLU) 75 MG capsule Take 1 capsule (75 mg total) by mouth daily. 10 capsule 0   No facility-administered medications prior to visit.    PE: Blood pressure 106/68, pulse 73, temperature 99 F (37.2 C), temperature source Temporal, resp. rate 18, height 5\' 5"  (1.651 m), weight 129 lb (58.514 kg), SpO2 99 %. Gen: Alert, well  appearing.  Patient is oriented to person, place, time, and situation. Back without deformity.  No palpable tenderness of spine or shoulders.  No scapula tenderness.   When she raises her left arm up above her head and I palpate I can locate a very tender, focal soft tissue trigger point in area of left mid back.  IMPRESSION AND PLAN:  Myofascial trigger point. Offered trigger point injection and pt agreed to this today.  A trigger point injection was performed at the site of maximal tenderness using 1/2 ml of 1% plain Lidocaine and 1/2 ml of 40mg /ml depo medrol. This was well tolerated, no immediate complications.  An After Visit Summary was printed and given to the patient.  FOLLOW UP: prn

## 2014-01-14 NOTE — Progress Notes (Signed)
Pre visit review using our clinic review tool, if applicable. No additional management support is needed unless otherwise documented below in the visit note. 

## 2014-02-10 ENCOUNTER — Telehealth: Payer: Self-pay | Admitting: Family Medicine

## 2014-02-10 MED ORDER — CLONAZEPAM 0.5 MG PO TABS: ORAL_TABLET | ORAL | Status: DC

## 2014-02-10 NOTE — Telephone Encounter (Signed)
Clonazepam rx printed. 

## 2014-02-10 NOTE — Telephone Encounter (Signed)
rx faxed

## 2014-02-10 NOTE — Telephone Encounter (Signed)
Rf request for clonazepam.  Last OV was 01/14/14.  Last Rx printed 05/17/13 x 3 rfs.  Please advise.

## 2014-03-28 ENCOUNTER — Ambulatory Visit: Payer: BC Managed Care – PPO

## 2014-03-29 ENCOUNTER — Ambulatory Visit (INDEPENDENT_AMBULATORY_CARE_PROVIDER_SITE_OTHER): Payer: BLUE CROSS/BLUE SHIELD | Admitting: Family Medicine

## 2014-03-29 DIAGNOSIS — Z309 Encounter for contraceptive management, unspecified: Secondary | ICD-10-CM

## 2014-03-29 MED ORDER — MEDROXYPROGESTERONE ACETATE 150 MG/ML IM SUSP
150.0000 mg | Freq: Once | INTRAMUSCULAR | Status: AC
  Administered 2014-03-29: 150 mg via INTRAMUSCULAR

## 2014-05-09 ENCOUNTER — Ambulatory Visit (INDEPENDENT_AMBULATORY_CARE_PROVIDER_SITE_OTHER): Payer: BLUE CROSS/BLUE SHIELD | Admitting: Family Medicine

## 2014-05-09 ENCOUNTER — Encounter: Payer: Self-pay | Admitting: Family Medicine

## 2014-05-09 VITALS — BP 108/73 | HR 73 | Temp 97.9°F | Ht 65.0 in | Wt 135.0 lb

## 2014-05-09 DIAGNOSIS — R52 Pain, unspecified: Secondary | ICD-10-CM

## 2014-05-09 DIAGNOSIS — M791 Myalgia, unspecified site: Secondary | ICD-10-CM

## 2014-05-09 DIAGNOSIS — M7918 Myalgia, other site: Secondary | ICD-10-CM | POA: Insufficient documentation

## 2014-05-09 HISTORY — DX: Myalgia, unspecified site: M79.10

## 2014-05-09 NOTE — Progress Notes (Signed)
OFFICE NOTE  05/09/2014  CC:  Chief Complaint  Patient presents with  . Back Pain    in the same spot that you gave steroid injection  . Weight Gain    concerns/bloating   HPI: Patient is a 29 y.o. Caucasian female who is here for pain in right scapula area.  I had given a trigger point injection 01/14/14 for this same pain/same spot and it helped until about a month ago (4 mo of relief).  Pain now coming back more, seems to be ok if she is sitting still, worse with movements of upper body or arms.  No radiation of the pain.  She is not sure about neck pain, says she sometimes feels like it hurts but this seems to be more associated with when she gets a HA that migrates to her occipital region.  Ibuprofen no help for back.  No heat or ice applied.   Pt says she is no longer taking narcotic pain med for chronic pain.  Denies: shoulder pains or paresthesias.  No rash.  No trauma.  No low back or mid back pain. +Excessive stress in her life lately.  Some heavy lifting once a month.    Pertinent PMH:  Past medical, surgical, social, and family history reviewed and no changes are noted since last office visit.  MEDS:  Outpatient Prescriptions Prior to Visit  Medication Sig Dispense Refill  . calcium carbonate (OS-CAL) 600 MG TABS Take 600 mg by mouth daily.      . clonazePAM (KLONOPIN) 0.5 MG tablet TAKE 1 TO 2 TABLETS BY MOUTH EVERY 8-12 HOURS AS NEEDED FOR ANXIETY 60 tablet 3  . HYDROcodone-acetaminophen (NORCO) 10-325 MG per tablet 1/2-1 tab po q6h prn pain in hip 60 tablet 0  . MedroxyPROGESTERone Acetate (DEPO-PROVERA IM) Inject into the muscle every 3 (three) months.      . rizatriptan (MAXALT) 10 MG tablet May repeat in 2 hours if needed--max of 30mg  in 24h period. 10 tablet 2  . valACYclovir (VALTREX) 1000 MG tablet 2 tabs po q12 hours x 2 doses 4 tablet 6  . valACYclovir (VALTREX) 1000 MG tablet 1 tab po qd 30 tablet 11  . zolpidem (AMBIEN CR) 12.5 MG CR tablet TAKE 1 TABLET BY  MOUTH AT BEDTIME AS NEEDED FOR SLEEP 30 tablet 3   No facility-administered medications prior to visit.    PE: Blood pressure 108/73, pulse 73, temperature 97.9 F (36.6 C), temperature source Temporal, height 5\' 5"  (1.651 m), weight 135 lb (61.236 kg), SpO2 100 %.  Pt examined with Forestine NaBrandy Miller, CMA, as chaperone. Gen: Alert, well appearing.  Patient is oriented to person, place, time, and situation. Shoulders, traps, mid back, neck, and low back all without tenderness. She has 5/5 UE strength prox and dist.  UE DTRs 2+ biceps and triceps bilat. Focal TTP in inferior aspect of right scapula--about golf ball sized area.  We were able to locate an area of "most tenderness".  IMPRESSION AND PLAN:  Myofascial muscle pain/trigger point in right scapula region: responded to trigger point injection in the past. She desired same injection today so we proceeded with this.  A trigger point injection into right scapula lower medial aspect was performed at the site of maximal tenderness using 1/2 ml of 1% plain Lidocaine w/out epi and depo medrol 1/2 ml of 40mg /ml suspension. This was well tolerated, no immediate complications.  I asked her to make a return appt to discuss some GI and wt gain complaints  she has been having lately.  FOLLOW UP: prn

## 2014-05-09 NOTE — Progress Notes (Signed)
Pre visit review using our clinic review tool, if applicable. No additional management support is needed unless otherwise documented below in the visit note. 

## 2014-05-10 ENCOUNTER — Encounter: Payer: Self-pay | Admitting: Family Medicine

## 2014-05-10 ENCOUNTER — Ambulatory Visit (INDEPENDENT_AMBULATORY_CARE_PROVIDER_SITE_OTHER): Payer: BLUE CROSS/BLUE SHIELD | Admitting: Family Medicine

## 2014-05-10 VITALS — BP 104/71 | HR 82 | Temp 98.6°F | Ht 65.0 in | Wt 135.0 lb

## 2014-05-10 DIAGNOSIS — R1084 Generalized abdominal pain: Secondary | ICD-10-CM

## 2014-05-10 DIAGNOSIS — K589 Irritable bowel syndrome without diarrhea: Secondary | ICD-10-CM | POA: Diagnosis not present

## 2014-05-10 DIAGNOSIS — R635 Abnormal weight gain: Secondary | ICD-10-CM

## 2014-05-10 DIAGNOSIS — R140 Abdominal distension (gaseous): Secondary | ICD-10-CM | POA: Diagnosis not present

## 2014-05-10 LAB — COMPREHENSIVE METABOLIC PANEL
ALT: 10 U/L (ref 0–35)
AST: 13 U/L (ref 0–37)
Albumin: 4.5 g/dL (ref 3.5–5.2)
Alkaline Phosphatase: 54 U/L (ref 39–117)
BUN: 10 mg/dL (ref 6–23)
CO2: 28 mEq/L (ref 19–32)
Calcium: 9.7 mg/dL (ref 8.4–10.5)
Chloride: 106 mEq/L (ref 96–112)
Creatinine, Ser: 0.66 mg/dL (ref 0.40–1.20)
GFR: 112.93 mL/min (ref >60.00)
Glucose, Bld: 93 mg/dL (ref 70–99)
Potassium: 4.7 mEq/L (ref 3.5–5.1)
Sodium: 138 mEq/L (ref 135–145)
Total Bilirubin: 0.5 mg/dL (ref 0.2–1.2)
Total Protein: 7.9 g/dL (ref 6.0–8.3)

## 2014-05-10 LAB — CBC WITH DIFFERENTIAL/PLATELET
Basophils Absolute: 0 10*3/uL (ref 0.0–0.1)
Basophils Relative: 0.4 % (ref 0.0–3.0)
Eosinophils Absolute: 0.1 10*3/uL (ref 0.0–0.7)
Eosinophils Relative: 0.6 % (ref 0.0–5.0)
HCT: 39.8 % (ref 36.0–46.0)
Hemoglobin: 13.6 g/dL (ref 12.0–15.0)
Lymphocytes Relative: 24.1 % (ref 12.0–46.0)
Lymphs Abs: 2.1 10*3/uL (ref 0.7–4.0)
MCHC: 34.1 g/dL (ref 30.0–36.0)
MCV: 92.4 fl (ref 78.0–100.0)
Monocytes Absolute: 0.8 10*3/uL (ref 0.1–1.0)
Monocytes Relative: 9.2 % (ref 3.0–12.0)
Neutro Abs: 5.8 10*3/uL (ref 1.4–7.7)
Neutrophils Relative %: 65.7 % (ref 43.0–77.0)
Platelets: 276 10*3/uL (ref 150.0–400.0)
RBC: 4.31 Mil/uL (ref 3.87–5.11)
RDW: 12.4 % (ref 11.5–15.5)
WBC: 8.9 10*3/uL (ref 4.0–10.5)

## 2014-05-10 LAB — H. PYLORI ANTIBODY, IGG: H Pylori IgG: NEGATIVE

## 2014-05-10 LAB — TSH: TSH: 1.9 u[IU]/mL (ref 0.35–4.50)

## 2014-05-10 LAB — SEDIMENTATION RATE: Sed Rate: 22 mm/hr (ref 0–22)

## 2014-05-10 LAB — IGA: IgA: 579 mg/dL — ABNORMAL HIGH (ref 68–378)

## 2014-05-10 MED ORDER — POLYETHYLENE GLYCOL 3350 17 GM/SCOOP PO POWD: ORAL | Status: DC

## 2014-05-10 NOTE — Progress Notes (Signed)
OFFICE VISIT  05/15/2014   CC:  Chief Complaint  Patient presents with  . Weight Gain    and bloating   HPI:    Patient is a 29 y.o. Caucasian female who presents for complaints of bloating and wt gain.  Last 18 mo wt has gone up from 113 to 135 according to our wt measurements in the office.  Her lowest wt was 101 back in 2012. She feels like her appetite/caloric intake has fluctuated with this.  At times of increased appetite she had trouble with bloating, then she cut back on periods of increased intake and the bloating did not resolve.  Exercise during this time has not changed.  Has been doing PT for hip, occasionally takes hikes.   Denies any feeling of lack of control when eating, says her overeating often came from boredom.  No purging behavior. Describes generalized feeling of "bulging" in abdomen, particularly after eating.  High sodium foods and sodas make it the worst.  Some crampiness occasionally is felt.  The feeling of bloating lasts a few hours typically. Eating lighter does help but she finds this difficult to do.   Bowel habits during this time period: last 5 yrs has been on pain meds so infrequent BMs are her norm: says she has a BM approx q3d, says it often changes as far as the stool is sometimes hard and more difficult to pass and other times loose/unformed.  Has occ postprandial fecal urgency.  No blood or melanotic stool. She tried Mag-07 OTC laxative but this caused too much and frequent loose stools so she stopped this pretty quickly.  She was hoping this would help with her bloating.  Probiotics and vitamins have not worked. She had chronically been on narcotic pain meds up until a few months ago: she got tired of being on these and dealing with the side effects. No n/v.  Has had some mild urinary frequency at times but nothing persistent.  Denies dysuria, hematuria, or urinary urgency.   Past Medical History  Diagnosis Date  . HSV infection     Genital clx pos  HSV 2--2007.  . Drug overdose 2004    Xanax  . Depression with anxiety     Failed buspar.  Did well on citalopram.  . Brain injury 29 yrs old    fell off babysitters cabinet, and on a separate occasion she fell down a flight of stairs  . Closed head injury About age 58 yrs    ICH per pt, with seizure disorder for appox 2 yrs afterward.  No significant residual disability.  . Seizure     for 2 yrs after closed head injury/ICH  . NSVD (normal spontaneous vaginal delivery)     x 2.  One was given up for adoption (2008).  . Right hip pain 05/2009    MRI 10/12/09: Femoral head and neck stress injury plus small acetabular labral tear.  [facility] ortho (Dr. Caswell Corwin) as of 11/2012  . History of chlamydia   . H/O varicella   . Nausea 10/22/07    With menses  . Hx: UTI (urinary tract infection) 09/06/10    Past Surgical History  Procedure Laterality Date  . Wisdom tooth extraction    . Mole removal      on back  . Hip surgery  10/10/2010    Right hip GT bursectomy and osteophytectomy for intractable right greater troch pain (Dr. Despina Hick).  [facility] second opinion 06/2012--resulted in repeat MRI, then right hip  IA injection that gave mild short term response, then was referred to PT with iontophoresis (as of 09/07/12)--was told to f/u in 6 wks.    Outpatient Prescriptions Prior to Visit  Medication Sig Dispense Refill  . calcium carbonate (OS-CAL) 600 MG TABS Take 600 mg by mouth daily.      . clonazePAM (KLONOPIN) 0.5 MG tablet TAKE 1 TO 2 TABLETS BY MOUTH EVERY 8-12 HOURS AS NEEDED FOR ANXIETY 60 tablet 3  . MedroxyPROGESTERone Acetate (DEPO-PROVERA IM) Inject into the muscle every 3 (three) months.      . rizatriptan (MAXALT) 10 MG tablet May repeat in 2 hours if needed--max of  in 24h period. 10 tablet 2  . valACYclovir (VALTREX) 1000 MG tablet 2 tabs po q12 hours x 2 doses 4 tablet 6  . valACYclovir (VALTREX) 1000 MG tablet 1 tab po qd 30 tablet 11  . zolpidem (AMBIEN CR) 12.5 MG CR tablet TAKE 1  TABLET BY MOUTH AT BEDTIME AS NEEDED FOR SLEEP 30 tablet 3   No facility-administered medications prior to visit.    Allergies  Allergen Reactions  . Penicillins Rash    ROS As per HPI  PE: Blood pressure 104/71, pulse 82, temperature 98.6 F (37 C), temperature source Temporal, height  (1.651 m), weight 135 lb (61.236 kg), SpO2 98 %.  Pt examined with Faythe Ghee, CMA, as chaperone. Gen: Alert, well appearing.  Patient is oriented to person, place, time, and situation. WUJ:WJXB: no injection, icteris, swelling, or exudate.  EOMI, PERRLA. Mouth: lips without lesion/swelling.  Oral mucosa pink and moist. Oropharynx without erythema, exudate, or swelling.  Neck - No masses or thyromegaly or limitation in range of motion CV: RRR, no m/r/g.   LUNGS: CTA bilat, nonlabored resps, good aeration in all lung fields. ABD: soft, NT, ND, BS normal.  No hepatospenomegaly or mass.  No bruits. EXT: no clubbing, cyanosis, or edema.  Skin - no sores or suspicious lesions or rashes or color changes  LABS:  Lab Results  Component Value Date   TSH 1.90 05/10/2014   Lab Results  Component Value Date   WBC 8.9 05/10/2014   HGB 13.6 05/10/2014   HCT 39.8 05/10/2014   MCV 92.4 05/10/2014   PLT 276.0 05/10/2014     Chemistry      Component Value Date/Time   NA 138 05/10/2014 1126   K 4.7 05/10/2014 1126   CL 106 05/10/2014 1126   CO2 28 05/10/2014 1126   BUN 10 05/10/2014 1126   CREATININE 0.66 05/10/2014 1126   CREATININE 0.69 07/10/2010 1407      Component Value Date/Time   CALCIUM 9.7 05/10/2014 1126   ALKPHOS 54 05/10/2014 1126   AST 13 05/10/2014 1126   ALT 10 05/10/2014 1126   BILITOT 0.5 05/10/2014 1126      IMPRESSION AND PLAN:  1) Generalized abdominal bloating: suspect IBS, predominantly constipation type. Will do labs to screen for celiac dz. Diet and Gas info handout given to pt today. Will do trial of miralax powder 1 capful qd and titrate to 1 capful bid  if BMs not a bit more frequent and easier to pass.  Hopefully a secondary effect of getting BMs more regular and soft will be a decrease in her bloating feeling. Will do hemoccults x 3 as well.  2) Weight gain; suspect simply due to calories in>calories burned. Will r/o hypothyroidism.  Depo-provera could have contributed some to her wt gain.   Unfortunately, aggressive CV exercise  to aid in calorie-burning is/will be difficult for her due to her chronic hip pain.  An After Visit Summary was printed and given to the patient.  FOLLOW UP: Return in about 4 weeks (around 06/07/2014) for f/u IBS.

## 2014-05-10 NOTE — Progress Notes (Signed)
Pre visit review using our clinic review tool, if applicable. No additional management support is needed unless otherwise documented below in the visit note. 

## 2014-05-13 LAB — GLIADIN IGA+TTG IGA: Transglutaminase IgA: <2 U/mL (ref 0–3)

## 2014-05-20 ENCOUNTER — Other Ambulatory Visit: Payer: BLUE CROSS/BLUE SHIELD

## 2014-05-20 ENCOUNTER — Telehealth: Payer: Self-pay | Admitting: *Deleted

## 2014-05-20 DIAGNOSIS — R140 Abdominal distension (gaseous): Secondary | ICD-10-CM

## 2014-05-20 DIAGNOSIS — R635 Abnormal weight gain: Secondary | ICD-10-CM

## 2014-05-20 DIAGNOSIS — R1084 Generalized abdominal pain: Secondary | ICD-10-CM

## 2014-05-20 DIAGNOSIS — K589 Irritable bowel syndrome without diarrhea: Secondary | ICD-10-CM

## 2014-05-20 LAB — HEMOCCULT SLIDES (X 3 CARDS)
Fecal Occult Blood: NEGATIVE
OCCULT 1: NEGATIVE
OCCULT 2: NEGATIVE
OCCULT 3: NEGATIVE
OCCULT 4: NEGATIVE
OCCULT 5: NEGATIVE

## 2014-05-20 NOTE — Telephone Encounter (Signed)
Patient left vm stating that she has been taking Mirlax powder qd and that the med is haiping her go to the bathroom. However, she has been raw at the anal area for a while because her stools are all loose. Patient would like advise on what she should do. Please advise?

## 2014-05-20 NOTE — Telephone Encounter (Signed)
Cut back on miralax to 1/2 capful once a day. Use OTC diaper ointment on anal area where she is raw (desitin or A&D ointment).-thx

## 2014-05-20 NOTE — Telephone Encounter (Signed)
Patient notified

## 2014-05-24 ENCOUNTER — Encounter: Payer: Self-pay | Admitting: Family Medicine

## 2014-05-24 ENCOUNTER — Other Ambulatory Visit: Payer: Self-pay | Admitting: *Deleted

## 2014-05-24 MED ORDER — ZOLPIDEM TARTRATE ER 12.5 MG PO TBCR: 12.5000 mg | EXTENDED_RELEASE_TABLET | Freq: Every evening | ORAL | Status: DC | PRN

## 2014-05-24 NOTE — Telephone Encounter (Addendum)
Refill request for zolpidem Last filled by MD on- 05/17/13 #30 x3 Last Appt: 3/39/2016 Next Appt: 06/08/2014 Please advise refill?

## 2014-06-08 ENCOUNTER — Encounter: Payer: Self-pay | Admitting: Family Medicine

## 2014-06-08 ENCOUNTER — Ambulatory Visit (INDEPENDENT_AMBULATORY_CARE_PROVIDER_SITE_OTHER): Payer: BLUE CROSS/BLUE SHIELD | Admitting: Family Medicine

## 2014-06-08 VITALS — BP 99/64 | HR 77 | Temp 98.3°F | Resp 18 | Ht 65.0 in | Wt 130.0 lb

## 2014-06-08 DIAGNOSIS — K589 Irritable bowel syndrome without diarrhea: Secondary | ICD-10-CM

## 2014-06-08 NOTE — Progress Notes (Signed)
OFFICE NOTE  06/08/2014  CC:  Chief Complaint  Patient presents with  . Follow-up    HPI: Patient is a 29 y.o. Caucasian female who is here for 1 mo f/u IBS. She feels better.  Bloating and crampy abd pains have improved significantly. Taking 1 capful per day of miralax and seems to be happy with BM frequency and consistency with this dosing. No melena or hematochezia.  No fevers.  Pertinent PMH:  Past medical, surgical, social, and family history reviewed and no changes are noted since last office visit.  MEDS:  Outpatient Prescriptions Prior to Visit  Medication Sig Dispense Refill  . calcium carbonate (OS-CAL) 600 MG TABS Take 600 mg by mouth daily.      . clonazePAM (KLONOPIN) 0.5 MG tablet TAKE 1 TO 2 TABLETS BY MOUTH EVERY 8-12 HOURS AS NEEDED FOR ANXIETY 60 tablet 3  . MedroxyPROGESTERone Acetate (DEPO-PROVERA IM) Inject into the muscle every 3 (three) months.      . polyethylene glycol powder (GLYCOLAX/MIRALAX) powder 1 capful in 8 oz of water one to two times per day 850 g 3  . rizatriptan (MAXALT) 10 MG tablet May repeat in 2 hours if needed--max of 30mg  in 24h period. 10 tablet 2  . valACYclovir (VALTREX) 1000 MG tablet 1 tab po qd 30 tablet 11  . zolpidem (AMBIEN CR) 12.5 MG CR tablet Take 1 tablet (12.5 mg total) by mouth at bedtime as needed. for sleep 30 tablet 3  . valACYclovir (VALTREX) 1000 MG tablet 2 tabs po q12 hours x 2 doses (Patient not taking: Reported on 06/08/2014) 4 tablet 6   No facility-administered medications prior to visit.    PE: Blood pressure 99/64, pulse 77, temperature 98.3 F (36.8 C), temperature source Temporal, resp. rate 18, height 5\' 5"  (1.651 m), weight 130 lb (58.968 kg), SpO2 98 %. Wt is down 5 lb  Gen: Alert, well appearing.  Patient is oriented to person, place, time, and situation. ABD: soft, nondistended, nontender, BS hypoactive  IMPRESSION AND PLAN:  IBS, constipation-predominant. Improved. Continue miralax 1 capful qd,  adjust prn according to symptoms/BM frequency. Continue to limit intake of foods that are associated with excessive gas.  An After Visit Summary was printed and given to the patient.  FOLLOW UP: 4 mo

## 2014-06-08 NOTE — Progress Notes (Signed)
Pre visit review using our clinic review tool, if applicable. No additional management support is needed unless otherwise documented below in the visit note. 

## 2014-06-21 ENCOUNTER — Ambulatory Visit (INDEPENDENT_AMBULATORY_CARE_PROVIDER_SITE_OTHER): Payer: BLUE CROSS/BLUE SHIELD | Admitting: Family Medicine

## 2014-06-21 DIAGNOSIS — Z309 Encounter for contraceptive management, unspecified: Secondary | ICD-10-CM

## 2014-06-21 MED ORDER — MEDROXYPROGESTERONE ACETATE 150 MG/ML IM SUSP
150.0000 mg | Freq: Once | INTRAMUSCULAR | Status: AC
  Administered 2014-06-21: 150 mg via INTRAMUSCULAR

## 2014-07-19 ENCOUNTER — Encounter: Payer: Self-pay | Admitting: Family Medicine

## 2014-07-19 ENCOUNTER — Ambulatory Visit (INDEPENDENT_AMBULATORY_CARE_PROVIDER_SITE_OTHER): Payer: BLUE CROSS/BLUE SHIELD | Admitting: Family Medicine

## 2014-07-19 VITALS — BP 96/66 | HR 65 | Temp 98.0°F | Resp 16 | Wt 130.0 lb

## 2014-07-19 DIAGNOSIS — M25562 Pain in left knee: Secondary | ICD-10-CM | POA: Diagnosis not present

## 2014-07-19 DIAGNOSIS — M222X2 Patellofemoral disorders, left knee: Secondary | ICD-10-CM | POA: Diagnosis not present

## 2014-07-19 NOTE — Progress Notes (Signed)
OFFICE NOTE  07/19/2014  CC:  Chief Complaint  Patient presents with  . Knee Pain    left knee x 1 day, no known injury   HPI: Patient is a 29 y.o. Caucasian female who is here for onset of left knee yesterday while [place]--no preceding trauma or accident or strain.  She points to inferomedial aspect of left knee.  Says it hurts with bending, esp with wt bearing and doing things like going up stairs. Straightening leg out some while sitting alleviates the pain.  Ibup no help.  No ice applied. Has had similar pain in right knee briefly in the past.  Pertinent PMH:  Past medical, surgical, social, and family history reviewed and no changes are noted since last office visit.  MEDS:  Outpatient Prescriptions Prior to Visit  Medication Sig Dispense Refill  . calcium carbonate (OS-CAL) 600 MG TABS Take 600 mg by mouth daily.      . clonazePAM (KLONOPIN) 0.5 MG tablet TAKE 1 TO 2 TABLETS BY MOUTH EVERY 8-12 HOURS AS NEEDED FOR ANXIETY 60 tablet 3  . MedroxyPROGESTERone Acetate (DEPO-PROVERA IM) Inject into the muscle every 3 (three) months.      . polyethylene glycol powder (GLYCOLAX/MIRALAX) powder 1 capful in 8 oz of water one to two times per day 850 g 3  . rizatriptan (MAXALT) 10 MG tablet May repeat in 2 hours if needed--max of 30mg  in 24h period. 10 tablet 2  . valACYclovir (VALTREX) 1000 MG tablet 2 tabs po q12 hours x 2 doses 4 tablet 6  . valACYclovir (VALTREX) 1000 MG tablet 1 tab po qd 30 tablet 11  . zolpidem (AMBIEN CR) 12.5 MG CR tablet Take 1 tablet (12.5 mg total) by mouth at bedtime as needed. for sleep 30 tablet 3   No facility-administered medications prior to visit.    PE: Blood pressure 96/66, pulse 65, temperature 98 F (36.7 C), temperature source Oral, resp. rate 16, weight 130 lb (58.968 kg), SpO2 99 %. Gen: Alert, well appearing.  Patient is oriented to person, place, time, and situation. L knee without erythema, swelling, or warmth. ROM intact, w/out  instability.  Varus/valgus stress induces no pain, no laxity. Patellar grind negative.  No patellar subluxation.  No joint line tenderness.  No tenderness of quad or patellar tendon.  Pes anserine bursa nontender.  Mild TTP in area just medial and inferior to patella--right next to the patellar tendon.  IMPRESSION AND PLAN:  Patellofemoral pain.  Discussed dx, potential waxing/waning nature depending on activities. Discussed medial quad strengthening exercises, use of soft knee sleeve prn. NSAIDs prn, activity modification prn.   Signs/symptoms to call or return for were reviewed and pt expressed understanding. Educational handout on the topic was given to patient today.  An After Visit Summary was printed and given to the patient.  FOLLOW UP: prn

## 2014-07-19 NOTE — Patient Instructions (Signed)
Patellofemoral Syndrome If you have had pain in the front of your knee for a long time, chances are good that you have patellofemoral syndrome. The word patella refers to the kneecap. Femoral (or femur) refers to the thigh bone. That is the bone the kneecap sits on. The kneecap is shaped like a triangle. Its job is to protect the knee and to improve the efficiency of your thigh muscles (quadriceps). The underside of the kneecap is made of smooth tissue (cartilage). This lets the kneecap slide up and down as the knee moves. Sometimes this cartilage becomes soft. Your healthcare provider may say the cartilage breaks down. That is patellofemoral syndrome. It can affect one knee, or both. The condition is sometimes called patellofemoral pain syndrome. That is because the condition is painful. The pain usually gets worse with activity. Sitting for a long time with the knee bent also makes the pain worse. It usually gets better with rest and proper treatment. CAUSES  No one is sure why some people develop this problem and others do not. Runners often get it. One name for the condition is "runner's knee." However, some people run for years and never have knee pain. Certain things seem to make patellofemoral syndrome more likely. They include:  Moving out of alignment. The kneecap is supposed to move in a straight line when the thigh muscle pulls on it. Sometimes the kneecap moves in poor alignment. That can make the knee swell and hurt. Some experts believe it also wears down the cartilage.  Injury to the kneecap.  Strain on the knee. This may occur during sports activity. Soccer, running, skiing and cycling can put excess stress on the knee.  Being flat-footed or knock-kneed. SYMPTOMS   Knee pain.  Pain under the kneecap. This is usually a dull, aching pain.  Pain in the knee when doing certain things: squatting, kneeling, going up or down stairs.  Pain in the knee when you stand up after sitting down  for awhile.  Tightness in the knee.  Loss of muscle strength in the thigh.  Swelling of the knee. DIAGNOSIS  Healthcare providers often send people with knee pain to an orthopedic caregiver. This person has special training to treat problems with bones and joints. To decide what is causing your knee pain, your caregiver will probably:  Do a physical exam. This will probably include:  Asking about symptoms you have noticed.  Asking about your activities and any injuries.  Feeling your knee. Moving it. This will help test the knee's strength. It will also check alignment (whether the knee and leg are aligned normally).  Order some tests, such as:  Imaging tests. They create pictures of the inside of the knee. Tests may include:  X-rays.  Computed tomography (CT) scan. This uses X-rays and a computer to show more detail.  Magnetic resonance imaging (MRI). This test uses magnets, radio waves and a computer to make pictures. TREATMENT   Medication is almost always used first. It can relieve pain. It also can reduce swelling. Non-steroidal anti-inflammatory medicines (called NSAIDs) are usually suggested. Sometimes a stronger form is needed. A stronger form would require a prescription.  Other treatment may be needed after the swelling goes down. Possibilities include:  Exercise. Certain exercises can make the muscles around the knee stronger which decreases the pressure on the knee cap. This includes the thigh muscle. Certain exercises also may be suggested to increase your flexibility.  A knee brace. This gives the knee extra support   and helps align the movement of the knee cap.  Orthotics. These are special shoe inserts. They can help keep your leg and knee aligned.  Surgery is sometimes needed. This is rare. Options include:  Arthroscopy. The surgeon uses a special tool to remove any damaged pieces of the kneecap. Only a few small incisions (cuts) are needed.  Realignment.  This is open surgery. The goals are to reduce pressure and fix the way the kneecap moves. HOME CARE INSTRUCTIONS   Take any medication prescribed by your healthcare provider. Follow the directions carefully.  If your knee is swollen:  Put ice or cold packs on it. Do this for 20 to 30 minutes, 3 to 4 times a day.  Keep the knee raised. Make sure it is supported. Put a pillow under it.  Rest your knee. For example, take the elevator instead of the stairs for awhile. Or, take a break from sports activity that strain your knee. Try walking or swimming instead.  Whenever you are active:  Use an elastic bandage on your knee. This gives it support.  After any activity, put ice or cold packs on your knees. Do this for about 10 to 20 minutes.  Make sure you wear shoes that give good support. Make sure they are not worn down. The heels should not slant in or out. SEEK MEDICAL CARE IF:   Knee pain gets worse. Or it does not go away, even after taking pain medicine.  Swelling does not go down.  Your thigh muscle becomes weak.  You have an oral temperature above 102 F (38.9 C). SEEK IMMEDIATE MEDICAL CARE IF:  You have an oral temperature above 102 F (38.9 C), not controlled by medicine. Document Released: 01/16/2009 Document Revised: 04/22/2011 Document Reviewed: 04/19/2013 Exit[employer] Patient Information 2015 [employer]. This information is not intended to replace advice given to you by your health care provider. Make sure you discuss any questions you have with your health care provider.  

## 2014-07-19 NOTE — Progress Notes (Signed)
Pre visit review using our clinic review tool, if applicable. No additional management support is needed unless otherwise documented below in the visit note. 

## 2014-08-17 ENCOUNTER — Other Ambulatory Visit: Payer: Self-pay | Admitting: *Deleted

## 2014-08-17 MED ORDER — VALACYCLOVIR HCL 1 G PO TABS: ORAL_TABLET | ORAL | Status: DC

## 2014-08-17 NOTE — Telephone Encounter (Signed)
RF request for valacyclovir.  LOV: 07/19/14 Next ov: 10/06/14 Last written: 07/08/13 #30 w/ 11RF

## 2014-09-06 ENCOUNTER — Other Ambulatory Visit: Payer: Self-pay | Admitting: Family Medicine

## 2014-09-06 ENCOUNTER — Ambulatory Visit (INDEPENDENT_AMBULATORY_CARE_PROVIDER_SITE_OTHER): Payer: BLUE CROSS/BLUE SHIELD | Admitting: Family Medicine

## 2014-09-06 DIAGNOSIS — Z3009 Encounter for other general counseling and advice on contraception: Secondary | ICD-10-CM | POA: Diagnosis not present

## 2014-09-06 DIAGNOSIS — Z111 Encounter for screening for respiratory tuberculosis: Secondary | ICD-10-CM

## 2014-09-06 MED ORDER — MEDROXYPROGESTERONE ACETATE 150 MG/ML IM SUSP
150.0000 mg | Freq: Once | INTRAMUSCULAR | Status: AC
  Administered 2014-09-06: 150 mg via INTRAMUSCULAR

## 2014-09-06 NOTE — Telephone Encounter (Signed)
RF request for clonazepam LOV: 07/19/14 Next ov:  10/06/14 Last written: 02/10/14 #60 w/ 3RF.

## 2014-09-06 NOTE — Telephone Encounter (Signed)
Rx to [facility]

## 2014-09-07 MED ORDER — CLONAZEPAM 0.5 MG PO TABS
ORAL_TABLET | ORAL | Status: DC
Start: 2014-09-07 — End: 2015-02-12

## 2014-09-08 ENCOUNTER — Ambulatory Visit: Payer: BLUE CROSS/BLUE SHIELD

## 2014-09-09 ENCOUNTER — Encounter: Payer: Self-pay | Admitting: Family Medicine

## 2014-09-09 ENCOUNTER — Ambulatory Visit: Payer: BLUE CROSS/BLUE SHIELD

## 2014-09-09 LAB — TB SKIN TEST
Induration: 0 mm
TB Skin Test: NEGATIVE

## 2014-10-06 ENCOUNTER — Ambulatory Visit: Payer: BLUE CROSS/BLUE SHIELD | Admitting: Family Medicine

## 2014-10-10 ENCOUNTER — Encounter: Payer: Self-pay | Admitting: Family Medicine

## 2014-10-10 ENCOUNTER — Ambulatory Visit (INDEPENDENT_AMBULATORY_CARE_PROVIDER_SITE_OTHER): Payer: BLUE CROSS/BLUE SHIELD | Admitting: Family Medicine

## 2014-10-10 VITALS — BP 109/69 | HR 74 | Temp 97.9°F | Resp 16 | Ht 65.0 in | Wt 131.0 lb

## 2014-10-10 DIAGNOSIS — K589 Irritable bowel syndrome without diarrhea: Secondary | ICD-10-CM | POA: Diagnosis not present

## 2014-10-10 MED ORDER — LUBIPROSTONE 8 MCG PO CAPS: 8.0000 ug | ORAL_CAPSULE | Freq: Two times a day (BID) | ORAL | Status: DC

## 2014-10-10 NOTE — Progress Notes (Signed)
Pre visit review using our clinic review tool, if applicable. No additional management support is needed unless otherwise documented below in the visit note. 

## 2014-10-10 NOTE — Progress Notes (Signed)
OFFICE VISIT  10/10/2014   CC:  Chief Complaint  Patient presents with  . Follow-up   HPI:    Patient is a 29 y.o. Caucasian female who presents for 4 mo f/u IBS, constipation-predominant. Bloating and abd discomfort related to infrequent BMs that are sometimes hard/difficult to pass---all of this responds well to miralax BUT pt has trouble finding the ideal dose and also says that b/c it is a powder that requires mixing with water she sometimes forgets to take it.  She asks if there is a pill that can be given to help her with her IBS. No fevers, no hematochezia.  Appetite is good, wt is stable.  No n/v.  Anxiety: doing well.  Uses clonaz 0.5mg , 1-2 bid prn. Still living with parents, trying to pay down medical bills. Getting latest hip therapy via [facility] orth[facility] --"PRP injection therapy".  Has received injection one of 3, still too early to see if it is going to help at this point in time.   Past Medical History  Diagnosis Date  . HSV infection     Genital clx pos HSV 2--2007.  . Drug overdose 2004    Xanax  . Depression with anxiety     Failed buspar.  Did well on citalopram.  . Brain injury 29 yrs old    fell off babysitters cabinet, and on a separate occasion she fell down a flight of stairs  . Closed head injury About age 21 yrs    ICH per pt, with seizure disorder for appox 2 yrs afterward.  No significant residual disability.  . Seizure     for 2 yrs after closed head injury/ICH  . NSVD (normal spontaneous vaginal delivery)     x 2.  One was given up for adoption (2008).  . Right hip pain 05/2009    MRI 10/12/09: Femoral head and neck stress injury plus small acetabular labral tear.  [facility] ortho (Dr. Caswell Corwin) as of 11/2012  . History of chlamydia   . H/O varicella   . Nausea 10/22/07    With menses  . Hx: UTI (urinary tract infection) 09/06/10  . IBS (irritable bowel syndrome)     Past Surgical History  Procedure Laterality Date  . Wisdom tooth extraction    . Mole  removal      on back  . Hip surgery  10/10/2010    Right hip GT bursectomy and osteophytectomy for intractable right greater troch pain (Dr. Despina Hick).  [facility] second opinion 06/2012--resulted in repeat MRI, then right hip IA injection that gave mild short term response, then was referred to PT with iontophoresis (as of 09/07/12)--was told to f/u in 6 wks.    Outpatient Prescriptions Prior to Visit  Medication Sig Dispense Refill  . calcium carbonate (OS-CAL) 600 MG TABS Take 600 mg by mouth daily.      . clonazePAM (KLONOPIN) 0.5 MG tablet TAKE 1 TO 2 TABLETS BY MOUTH EVERY 8-12 HOURS AS NEEDED FOR ANXIETY 60 tablet 3  . MedroxyPROGESTERone Acetate (DEPO-PROVERA IM) Inject into the muscle every 3 (three) months.      . polyethylene glycol powder (GLYCOLAX/MIRALAX) powder 1 capful in 8 oz of water one to two times per day 850 g 3  . rizatriptan (MAXALT) 10 MG tablet May repeat in 2 hours if needed--max of  in 24h period. 10 tablet 2  . valACYclovir (VALTREX) 1000 MG tablet 2 tabs po q12 hours x 2 doses 4 tablet 6  . valACYclovir (VALTREX) 1000  MG tablet 1 tab po qd 30 tablet 11  . zolpidem (AMBIEN CR) 12.5 MG CR tablet Take 1 tablet (12.5 mg total) by mouth at bedtime as needed. for sleep 30 tablet 3   No facility-administered medications prior to visit.    Allergies  Allergen Reactions  . Penicillins Rash    ROS As per HPI  PE: Blood pressure 109/69, pulse 74, temperature 97.9 F (36.6 C), temperature source Oral, resp. rate 16, height 5\' 5"  (1.651 m), weight 131 lb (59.421 kg), SpO2 100 %. Gen: Alert, well appearing.  Patient is oriented to person, place, time, and situation. AFFECT: pleasant, lucid thought and speech. No further exam today.  LABS:  none  IMPRESSION AND PLAN:  IBS, constipation-predominant: having trouble being compliant with miralax. Will start trial of amitiza 8 mcg, 1 tab bid. Therapeutic expectations and side effect profile of medication discussed today.   Patient's questions answered.  An After Visit Summary was printed and given to the patient.  FOLLOW UP: Return in about 4 months (around 02/09/2015).

## 2014-10-11 ENCOUNTER — Telehealth: Payer: Self-pay | Admitting: *Deleted

## 2014-10-11 MED ORDER — LINACLOTIDE 290 MCG PO CAPS: 290.0000 ug | ORAL_CAPSULE | Freq: Every day | ORAL | Status: DC

## 2014-10-11 NOTE — Telephone Encounter (Signed)
Letter from insurance received; PA was denied as of 10/11/14.

## 2014-10-11 NOTE — Telephone Encounter (Signed)
Per Dr. Milinda Cave okay to send in Linzess 290 mcg caps take one cap by mouth daily #30 w/ 12RF. Left message for pt to call back.

## 2014-10-11 NOTE — Telephone Encounter (Signed)
Pt advised and voiced understanding.   

## 2014-10-11 NOTE — Telephone Encounter (Signed)
PA for Amitiza started and sent on 10/11/14 via [email or web address]. Key: [id number].

## 2014-10-11 NOTE — Telephone Encounter (Signed)
Rx has been sent to [facility].

## 2014-10-14 ENCOUNTER — Telehealth: Payer: Self-pay | Admitting: *Deleted

## 2014-10-14 ENCOUNTER — Other Ambulatory Visit: Payer: Self-pay | Admitting: Family Medicine

## 2014-10-14 MED ORDER — LINACLOTIDE 145 MCG PO CAPS
145.0000 ug | ORAL_CAPSULE | Freq: Every day | ORAL | Status: DC
Start: 2014-10-14 — End: 2014-12-26

## 2014-10-14 NOTE — Telephone Encounter (Signed)
No, this is not the intended effect of the med. I will send eRx for the lower dose of the same med right now.-thx

## 2014-10-14 NOTE — Telephone Encounter (Signed)
Pt advised and voiced understanding.   

## 2014-10-14 NOTE — Telephone Encounter (Signed)
Pt LMOM on 10/14/14 at 9:24am.  Spoke to pt and she stated that since she started the Linzess on Tuesday (10/11/14) she has had only watery bowel movements. She wanted to know if this was normal. Please advise. Thanks.

## 2014-11-03 ENCOUNTER — Other Ambulatory Visit: Payer: Self-pay | Admitting: *Deleted

## 2014-11-03 MED ORDER — ZOLPIDEM TARTRATE ER 12.5 MG PO TBCR: 12.5000 mg | EXTENDED_RELEASE_TABLET | Freq: Every evening | ORAL | Status: DC | PRN

## 2014-11-03 NOTE — Telephone Encounter (Signed)
RF request for zolpidem LOV: 10/10/14 Next ov: 02/09/15 Last written: 05/24/14 #30 w/ 3RF  Please advise. Thanks.

## 2014-12-01 ENCOUNTER — Encounter: Payer: Self-pay | Admitting: Family Medicine

## 2014-12-01 ENCOUNTER — Ambulatory Visit (INDEPENDENT_AMBULATORY_CARE_PROVIDER_SITE_OTHER): Payer: BLUE CROSS/BLUE SHIELD | Admitting: Family Medicine

## 2014-12-01 ENCOUNTER — Telehealth: Payer: Self-pay | Admitting: Family Medicine

## 2014-12-01 ENCOUNTER — Ambulatory Visit (INDEPENDENT_AMBULATORY_CARE_PROVIDER_SITE_OTHER)
Admission: RE | Admit: 2014-12-01 | Discharge: 2014-12-01 | Disposition: A | Discharge status: Home / Self Care | Payer: BLUE CROSS/BLUE SHIELD | Source: Ambulatory Visit | Attending: Family Medicine | Admitting: Family Medicine

## 2014-12-01 ENCOUNTER — Encounter: Payer: Self-pay | Admitting: *Deleted

## 2014-12-01 VITALS — BP 101/71 | HR 98 | Temp 98.0°F | Resp 20 | Wt 127.0 lb

## 2014-12-01 DIAGNOSIS — R059 Cough, unspecified: Secondary | ICD-10-CM | POA: Insufficient documentation

## 2014-12-01 DIAGNOSIS — J302 Other seasonal allergic rhinitis: Secondary | ICD-10-CM

## 2014-12-01 DIAGNOSIS — J309 Allergic rhinitis, unspecified: Secondary | ICD-10-CM

## 2014-12-01 DIAGNOSIS — R05 Cough: Secondary | ICD-10-CM

## 2014-12-01 HISTORY — DX: Allergic rhinitis, unspecified: J30.9

## 2014-12-01 MED ORDER — ALBUTEROL SULFATE HFA 108 (90 BASE) MCG/ACT IN AERS: 2.0000 | INHALATION_SPRAY | Freq: Four times a day (QID) | RESPIRATORY_TRACT | Status: DC | PRN

## 2014-12-01 MED ORDER — FLUTICASONE PROPIONATE 50 MCG/ACT NA SUSP: 2.0000 | Freq: Every day | NASAL | Status: DC

## 2014-12-01 NOTE — Progress Notes (Signed)
Subjective:    Patient ID: Terri [name], female    DOB: Jun 07, 1985, 29 y.o.   MRN: [id number]  HPI  Cough: Patient states that approximately one month ago [facility] with [facility] for 7 days, she does not recall the name of the antibiotic for a sinus infection. She states now she has noted she has a frontal headache the last 4 days, loss of appetite, fatigue and chest tightness. She states she is struggling to get through work the last 2 days, and she has gone home from work yesterday at 3 PM and slept until the next day. She does admit to a low-grade fever, without chills. She did vomit once 2 days ago, but none since. She denies nasal congestion or runny nose, rash or diarrhea.. She states through her illness month ago she did use Flonase for 7 days, and then stopped. She also had a other nasal spray which she used for 3 days. She did use nasal saline flushes as well. Patient has no asthma history, but endorses feeling like she was wheezing yesterday. Patient again went to the medical clinic yesterday, and was prescribed a prednisone burst and medication for headache.  Past Medical History  Diagnosis Date  . HSV infection     Genital clx pos HSV 2--2007.  . Drug overdose 2004    Xanax  . Depression with anxiety     Failed buspar.  Did well on citalopram.  . Brain injury (HCC) 29 yrs old    fell off babysitters cabinet, and on a separate occasion she fell down a flight of stairs  . Closed head injury About age 66 yrs    ICH per pt, with seizure disorder for appox 2 yrs afterward.  No significant residual disability.  . Seizure (HCC)     for 2 yrs after closed head injury/ICH  . NSVD (normal spontaneous vaginal delivery)     x 2.  One was given up for adoption (2008).  . Right hip pain 05/2009    MRI 10/12/09: Femoral head and neck stress injury plus small acetabular labral tear.  [facility] ortho (Dr. Caswell CorwinStubbs) as of 11/2012  . History of chlamydia   . H/O  varicella   . Nausea 10/22/07    With menses  . Hx: UTI (urinary tract infection) 09/06/10  . IBS (irritable bowel syndrome)    Allergies  Allergen Reactions  . Penicillins Rash   Social History   Social History  . Marital Status: Single    Spouse Name: N/A  . Number of Children: N/A  . Years of Education: N/A   Occupational History  . Not on file.   Social History Main Topics  . Smoking status: Former Smoker -- 0.30 packs/day    Types: Cigarettes    Quit date: 02/12/2007  . Smokeless tobacco: Never Used  . Alcohol Use: Yes     Comment: occasional  . Drug Use: No  . Sexual Activity:    Partners: Male    Birth Control/ Protection: Condom, Coitus interruptus   Other Topics Concern  . Not on file   Social History Narrative   Single.     [place] with [place] mom and dad and her 29 y/o son Terri [name].   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   Describes somewhat wild teenage years, "made some bad choices and hung out with the wrong people".  However, after that she had  no issues with drugs or alcohol.   Reports that she had a second child a couple of years after her son Terri Glaze (2008) and she gave him/her up for adoption right after birth.                    Review of Systems Negative, with the exception of above mentioned in HPI    Objective:   Physical Exam BP 101/71 mmHg  Pulse 98  Temp(Src) 98 F (36.7 C) (Oral)  Resp 20  Wt 127 lb (57.607 kg)  SpO2 99% Gen: Afebrile. No acute distress. Nontoxic in appearance, well-developed, well-nourished Caucasian female. Appears mildly fatigued. HENT: AT. [place]. Bilateral TM visualized and normal in appearance. MMM. Bilateral nares with very mild erythema, moderate swelling. Throat without erythema or exudates. Cobblestoning present. Mild cough on exam. No hoarseness on exam. Eyes:Pupils Equal Round Reactive to light, Extraocular movements intact,  Conjunctiva without redness,  discharge or icterus. Neck/lymp/endocrine: Supple, no lymphadenopathy CV: RRR  Chest: CTAB, no wheeze or crackles Abd: Soft. Flat. NTND. BS present. No Masses palpated.  Skin: No rashes, purpura or petechiae.     Assessment & Plan:  1. Other seasonal allergic rhinitis - fluticasone (FLONASE) 50 MCG/ACT nasal spray; Place 2 sprays into both nostrils daily.  Dispense: 16 g; Refill: 6  2. Cough - Considering patient's report wheezing, and cough greater than 3 weeks we'll obtain chest x-ray today. Patient was encouraged to take the prednisone taper prescribed from the medical clinic. Get plenty of rest, hydration. His chest x-ray showed signs of pneumonia will prescribe Z-Pak. - DG Chest 2 View; Future

## 2014-12-01 NOTE — Telephone Encounter (Signed)
Please call patient, she has no signs of pneumonia on her x-ray. X-ray did show some "hyperinflation" which are usually signs of at least mild asthma. He had been prescribed prednisone, I encouraged her to take this as directed. Prednisone is the treatment we do for people with asthma exacerbation, especially after viral or acute illness. - I have also called an albuterol inhaler for her to use. She can ask her pharmacist for instructions on proper use, if she has never used an inhaler prior. This is to be used every 4-6 hours, 2 puffs, just for the next 2 days as needed to help her open up her airways. After she is over this acute illness, she likely will only need this when she is affected by upper respiratory issues.

## 2014-12-01 NOTE — Patient Instructions (Signed)
Allergic Rhinitis Allergic rhinitis is when the mucous membranes in the nose respond to allergens. Allergens are particles in the air that cause your body to have an allergic reaction. This causes you to release allergic antibodies. Through a chain of events, these eventually cause you to release histamine into the blood stream. Although meant to protect the body, it is this release of histamine that causes your discomfort, such as frequent sneezing, congestion, and an itchy, runny nose.  CAUSES Seasonal allergic rhinitis (hay fever) is caused by pollen allergens that may come from grasses, trees, and weeds. Year-round allergic rhinitis (perennial allergic rhinitis) is caused by allergens such as house dust mites, pet dander, and mold spores. SYMPTOMS  Nasal stuffiness (congestion).  Itchy, runny nose with sneezing and tearing of the eyes. DIAGNOSIS Your health care provider can help you determine the allergen or allergens that trigger your symptoms. If you and your health care provider are unable to determine the allergen, skin or blood testing may be used. Your health care provider will diagnose your condition after taking your health history and performing a physical exam. Your health care provider may assess you for other related conditions, such as asthma, pink eye, or an ear infection. TREATMENT Allergic rhinitis does not have a cure, but it can be controlled by:  Medicines that block allergy symptoms. These may include allergy shots, nasal sprays, and oral antihistamines.  Avoiding the allergen. Hay fever may often be treated with antihistamines in pill or nasal spray forms. Antihistamines block the effects of histamine. There are over-the-counter medicines that may help with nasal congestion and swelling around the eyes. Check with your health care provider before taking or giving this medicine. If avoiding the allergen or the medicine prescribed do not work, there are many new medicines  your health care provider can prescribe. Stronger medicine may be used if initial measures are ineffective. Desensitizing injections can be used if medicine and avoidance does not work. Desensitization is when a patient is given ongoing shots until the body becomes less sensitive to the allergen. Make sure you follow up with your health care provider if problems continue. HOME CARE INSTRUCTIONS It is not possible to completely avoid allergens, but you can reduce your symptoms by taking steps to limit your exposure to them. It helps to know exactly what you are allergic to so that you can avoid your specific triggers. SEEK MEDICAL CARE IF:  You have a fever.  You develop a cough that does not stop easily (persistent).  You have shortness of breath.  You start wheezing.  Symptoms interfere with normal daily activities.   This information is not intended to replace advice given to you by your health care provider. Make sure you discuss any questions you have with your health care provider.   Document Released: 10/23/2000 Document Revised: 02/18/2014 Document Reviewed: 10/05/2012 Elsevier Interactive Patient Education 2016 Elsevier Inc.  

## 2014-12-02 NOTE — Telephone Encounter (Signed)
Reviewed xray results with patient given instructions for medication usage. Patient verbalized understanding.

## 2014-12-07 ENCOUNTER — Ambulatory Visit (INDEPENDENT_AMBULATORY_CARE_PROVIDER_SITE_OTHER): Payer: BLUE CROSS/BLUE SHIELD | Admitting: *Deleted

## 2014-12-07 DIAGNOSIS — Z23 Encounter for immunization: Secondary | ICD-10-CM | POA: Diagnosis not present

## 2014-12-07 DIAGNOSIS — Z308 Encounter for other contraceptive management: Secondary | ICD-10-CM

## 2014-12-07 MED ORDER — MEDROXYPROGESTERONE ACETATE 150 MG/ML IM SUSP
150.0000 mg | Freq: Once | INTRAMUSCULAR | Status: AC
  Administered 2014-12-07: 150 mg via INTRAMUSCULAR

## 2014-12-07 NOTE — Progress Notes (Signed)
Patient presents today for Depo-Prevera injection and Flu vaccine. Pateint tolerated injections well.

## 2014-12-26 ENCOUNTER — Encounter: Payer: Self-pay | Admitting: Family Medicine

## 2014-12-26 ENCOUNTER — Ambulatory Visit (INDEPENDENT_AMBULATORY_CARE_PROVIDER_SITE_OTHER): Payer: BLUE CROSS/BLUE SHIELD | Admitting: Family Medicine

## 2014-12-26 VITALS — BP 105/73 | HR 72 | Temp 98.2°F | Resp 20 | Wt 128.0 lb

## 2014-12-26 DIAGNOSIS — J0100 Acute maxillary sinusitis, unspecified: Secondary | ICD-10-CM | POA: Diagnosis not present

## 2014-12-26 DIAGNOSIS — J302 Other seasonal allergic rhinitis: Secondary | ICD-10-CM | POA: Diagnosis not present

## 2014-12-26 DIAGNOSIS — J329 Chronic sinusitis, unspecified: Secondary | ICD-10-CM | POA: Insufficient documentation

## 2014-12-26 MED ORDER — LEVOCETIRIZINE DIHYDROCHLORIDE 5 MG PO TABS: 5.0000 mg | ORAL_TABLET | Freq: Every evening | ORAL | Status: DC

## 2014-12-26 MED ORDER — DOXYCYCLINE HYCLATE 100 MG PO TABS: 100.0000 mg | ORAL_TABLET | Freq: Two times a day (BID) | ORAL | Status: DC

## 2014-12-26 MED ORDER — PREDNISONE 50 MG PO TABS: 50.0000 mg | ORAL_TABLET | Freq: Every day | ORAL | Status: DC

## 2014-12-26 MED ORDER — RIZATRIPTAN BENZOATE 10 MG PO TABS: ORAL_TABLET | ORAL | Status: DC

## 2014-12-26 NOTE — Progress Notes (Signed)
Subjective:    Patient ID: Terri [name], female    DOB: Jul 06, 1985, 29 y.o.   MRN: [id number]  HPI  Sinus/allergies: Patient presents for acute office visit with continued complaints of facial pressure, headaches and sinus congestion. She states that her sinuses stay congested unless using an over the counter sinus medication. She did get some relief with the prednisone burst approximately one month ago. She continues to use the Flonase daily as prescribed. Patient states she is confused if she is having sinus pressure headache or migraines. She denies any fever or chills. She admits to intermittent wheezing and chest tightness. Her headaches are mostly on the right side of her face. She is eating and drinking well. She reports she's never had a history of asthma or sinus problems/allergies like she has this year. Discussed with patient prior chest x-ray showed hyperinflation, which can be signs of asthma. Patient does seem to get some relief of wheezing from albuterol inhaler, which suggests asthma. She is currently not taking a daily antihistamine.  Past Medical History  Diagnosis Date  . HSV infection     Genital clx pos HSV 2--2007.  . Drug overdose 2004    Xanax  . Depression with anxiety     Failed buspar.  Did well on citalopram.  . Brain injury (HCC) 29 yrs old    fell off babysitters cabinet, and on a separate occasion she fell down a flight of stairs  . Closed head injury About age 48 yrs    ICH per pt, with seizure disorder for appox 2 yrs afterward.  No significant residual disability.  . Seizure (HCC)     for 2 yrs after closed head injury/ICH  . NSVD (normal spontaneous vaginal delivery)     x 2.  One was given up for adoption (2008).  . Right hip pain 05/2009    MRI 10/12/09: Femoral head and neck stress injury plus small acetabular labral tear.  [facility] ortho (Dr. Caswell Corwin) as of 11/2012  . History of chlamydia   . H/O varicella   . Nausea 10/22/07    With menses  . Hx: UTI  (urinary tract infection) 09/06/10  . IBS (irritable bowel syndrome)   . Allergy    Allergies  Allergen Reactions  . Penicillins Rash   Social History   Social History  . Marital Status: Single    Spouse Name: N/A  . Number of Children: N/A  . Years of Education: N/A   Occupational History  . Not on file.   Social History Main Topics  . Smoking status: Former Smoker -- 0.30 packs/day    Types: Cigarettes    Quit date: 02/12/2007  . Smokeless tobacco: Never Used  . Alcohol Use: Yes     Comment: occasional  . Drug Use: No  . Sexual Activity:    Partners: Male    Birth Control/ Protection: Condom, Coitus interruptus   Other Topics Concern  . Not on file   Social History Narrative   Single.     Lives in [place] with her mom and dad and her 55 y/o son Terri [name].   Works as a Mining engineer with mental/developmental disabilities.   Describes somewhat wild teenage years, "made some bad choices and hung out with the wrong people".  However, after that she had no issues with drugs or alcohol.   Reports that she had a second child a couple of years after her son Terri [name] (2008) and she gave him/her  up for adoption right after birth.                     Review of Systems Negative, with the exception of above mentioned in HPI     Objective:   Physical Exam BP 105/73 mmHg  Pulse 72  Temp(Src) 98.2 F (36.8 C) (Oral)  Resp 20  Wt 128 lb (58.06 kg)  SpO2 97% Gen: Afebrile. No acute distress. Nontoxic in appearance, well-developed, well-nourished Caucasian female. HENT: AT. [place]. Bilateral TM visualized shiny/full. No erythema or bulging tympanic membranes. MMM. Bilateral nares erythema, swelling, large turbinates bilaterally. Throat without erythema or exudates. Large tonsils bilaterally. No cough on exam. No hoarseness on exam. Cobblestoning present. Eyes:Pupils Equal Round Reactive to light, Extraocular movements intact,  Conjunctiva without redness, discharge  or icterus. Neck/lymp/endocrine: Supple, shotty cervical lymphadenopathy, nontender CV: RRR  Chest: Wheezing present right lung fields, clear to auscultation left lung fields. No rhonchi or crackles.    Assessment & Plan:  1. Other seasonal allergic rhinitis - Patient to start antihistamine daily and continue Flonase. - Discuss with large turbinates, this may be an allergic symptoms or respond to the above treatment, or may need consult. - levocetirizine (XYZAL) 5 MG tablet; Take 1 tablet (5 mg total) by mouth every evening.  Dispense: 30 tablet; Refill: 0  2. Acute maxillary sinusitis, recurrence not specified - Wheeze present on exam, as well as facial pressure right maxillary sinus. Patient encouraged to use albuterol inhaler every 4-6 hours 2 puffs as needed for wheezing or chest tightness. Prednisone burst provided. Will treat with doxycycline for possible sinusitis and cover lower respiratory tract. - doxycycline (VIBRA-TABS) 100 MG tablet; Take 1 tablet (100 mg total) by mouth 2 (two) times daily.  Dispense: 20 tablet; Refill: 0 - If no improvement in 2 weeks and symptoms, will add singular to regimen and consider pulmonary function tests.  Follow-up 2-4 weeks

## 2014-12-26 NOTE — Patient Instructions (Signed)

## 2015-01-13 ENCOUNTER — Telehealth: Payer: Self-pay

## 2015-01-13 NOTE — Telephone Encounter (Signed)
Pt is aware and agreeable

## 2015-01-13 NOTE — Telephone Encounter (Signed)
She can try OTC sinus relief medication, aleve or Excedrin migraine. If no resolution would need to see to make certain infection improved. Sometimes flonase use can also cause headaches in some people. She may need to stop this med if she is still taking and see if that helps

## 2015-01-13 NOTE — Telephone Encounter (Signed)
Pt was seen on 11/14, DX with sinus infection, treated with Doxy and Prednisone. All of her SX have resolved besides a daily, constant sinu headache. She does typically have migraines but not daily. She has been taking an antihistamine daily (that was recently prescribed). She did mention that the headaches are one sided. Pt would like to know if you have any other recommendations at this time so she can have some relief.

## 2015-01-23 ENCOUNTER — Telehealth: Payer: Self-pay | Admitting: *Deleted

## 2015-01-23 NOTE — Telephone Encounter (Signed)
She should be encouraged to make an appt and be evaluated for her new onset daily headaches. This should not be secondary to her sinus infection a month ago, and she was advised on possible OTC medications to try at the beginning of the month. If these are not working she needs be evaluated.

## 2015-01-23 NOTE — Telephone Encounter (Signed)
Patient called states she is still having daily headaches. States she is taking OTC meds for headache with no results. Seen here 12/26/14 for sinusitis. Patient wants to know what other alternatives are available.

## 2015-01-23 NOTE — Telephone Encounter (Signed)
Spoke with patient she scheduled appt for 01/24/15 .

## 2015-01-24 ENCOUNTER — Encounter: Payer: Self-pay | Admitting: Family Medicine

## 2015-01-24 ENCOUNTER — Ambulatory Visit (INDEPENDENT_AMBULATORY_CARE_PROVIDER_SITE_OTHER): Payer: BLUE CROSS/BLUE SHIELD | Admitting: Family Medicine

## 2015-01-24 VITALS — BP 106/71 | HR 92 | Temp 98.2°F | Resp 20 | Wt 129.2 lb

## 2015-01-24 DIAGNOSIS — G43009 Migraine without aura, not intractable, without status migrainosus: Secondary | ICD-10-CM

## 2015-01-24 DIAGNOSIS — G43909 Migraine, unspecified, not intractable, without status migrainosus: Secondary | ICD-10-CM | POA: Insufficient documentation

## 2015-01-24 LAB — TSH: TSH: 1.28 u[IU]/mL (ref 0.35–4.50)

## 2015-01-24 LAB — VITAMIN B12: Vitamin B-12: 295 pg/mL (ref 211–911)

## 2015-01-24 MED ORDER — CYCLOBENZAPRINE HCL 10 MG PO TABS: 10.0000 mg | ORAL_TABLET | Freq: Every day | ORAL | Status: DC

## 2015-01-24 MED ORDER — CYCLOBENZAPRINE HCL 10 MG PO TABS: 10.0000 mg | ORAL_TABLET | Freq: Two times a day (BID) | ORAL | Status: DC | PRN

## 2015-01-24 NOTE — Progress Notes (Signed)
Subjective:    Patient ID: Terri [name], female    DOB: [birth year], 29 y.o.   MRN: [id number]  HPI  Headaches: Patient presents for office visit today for increasing frequency of headaches. Patient states her migraines started a few years ago after discontinuing tramadol. Patient has chronic pain from her bilateral hips she is prescribed Vicodin. She states she cannot take the Vicodin, it also induces headache after she stopped using it. She currently takes Maxalt when having a migraine, and it is somewhat effective. She is able to fall asleep with Maxalt use and then when she wakes up her headaches are at least more of a dull pain. She reports headaches that occur now 5 out of 7 times a week. She has tried over-the-counter sinus medications and Excedrin Migraine, both without relief. She reports when her migraines first started she did have photophobia, nausea, vomiting and excruciating pain that made her feel like she was "going to die". She states now her migraines are usually right-sided and start around her right eye and progress to her right occipital condyle. She states she does not experience photophobia, but does endorse nausea and sometimes vomit with her headaches. Patient uses Depo Provera for birth control.   Past Medical History  Diagnosis Date  . HSV infection     Genital clx pos HSV 2--2007.  . Drug overdose 2004    Xanax  . Depression with anxiety     Failed buspar.  Did well on citalopram.  . Brain injury (HCC) 29 yrs old    fell off babysitters cabinet, and on a separate occasion she fell down a flight of stairs  . Closed head injury About age 56 yrs    ICH per pt, with seizure disorder for appox 2 yrs afterward.  No significant residual disability.  . Seizure (HCC)     for 2 yrs after closed head injury/ICH  . NSVD (normal spontaneous vaginal delivery)     x 2.  One was given up for adoption (2008).  . Right hip pain 05/2009    MRI 10/12/09: Femoral head and neck stress  injury plus small acetabular labral tear.  [facility] ortho (Dr. Caswell CorwinStubbs) as of 11/2012  . History of chlamydia   . H/O varicella   . Nausea 10/22/07    With menses  . Hx: UTI (urinary tract infection) 09/06/10  . IBS (irritable bowel syndrome)   . Allergy   . Hx of migraines    Allergies  Allergen Reactions  . Penicillins Rash   Past Surgical History  Procedure Laterality Date  . Wisdom tooth extraction    . Mole removal      on back  . Hip surgery  10/10/2010    Right hip GT bursectomy and osteophytectomy for intractable right greater troch pain (Dr. Despina HickAlusio).  [facility] second opinion 06/2012--resulted in repeat MRI, then right hip IA injection that gave mild short term response, then was referred to PT with iontophoresis (as of 09/07/12)--was told to f/u in 6 wks.   Family History  Problem Relation Age of Onset  . Cancer Mother     Thyroid and breast/ remission  . Hepatitis Father     Hep C  . Alcohol abuse Father   . Thyroid disease Sister   . Cancer Sister     thyroid/ remission  . Anxiety disorder Sister   . Thrombophlebitis Maternal Grandfather   . Anxiety disorder Maternal Grandfather   . Heart disease Maternal Grandfather   .  Alzheimer's disease Paternal Grandfather    Social History   Social History  . Marital Status: Single    Spouse Name: N/A  . Number of Children: N/A  . Years of Education: N/A   Occupational History  . Not on file.   Social History Main Topics  . Smoking status: Former Smoker -- 0.30 packs/day    Types: Cigarettes    Quit date: 02/12/2007  . Smokeless tobacco: Never Used  . Alcohol Use: Yes     Comment: occasional  . Drug Use: No  . Sexual Activity:    Partners: Male    Birth Control/ Protection: Condom, Coitus interruptus   Other Topics Concern  . Not on file   Social History Narrative   Single.     Lives in [place] with her mom and dad and her 33 y/o son Terri [name].   Works as a Mining engineer with mental/developmental  disabilities.   Describes somewhat wild teenage years, "made some bad choices and hung out with the wrong people".  However, after that she had no issues with drugs or alcohol.   Reports that she had a second child a couple of years after her son Terri [name] (2008) and she gave him/her up for adoption right after birth.                      Review of Systems Negative, with the exception of above mentioned in HPI     Objective:   Physical Exam BP 106/71 mmHg  Pulse 92  Temp(Src) 98.2 F (36.8 C) (Oral)  Resp 20  Wt 129 lb 4 oz (58.627 kg)  SpO2 100% Gen: Afebrile. No acute distress. Nontoxic in appearance, well-developed, well-nourished, Caucasian female, pleasant. HENT: AT. [place]. Bilateral TM visualized and normal in appearance. MMM, no oral lesions. Bilateral nares without erythema or swelling. Throat without erythema or exudates.  Eyes:Pupils Equal Round Reactive to light, Extraocular movements intact,  Conjunctiva without redness, discharge or icterus. Neck/lymp/endocrine: Supple, no bony tenderness, no paraspinal fullness. Full range of motion. Neurovascular intact distally. CV: RRR  Neuro: Normal gait. PERLA. EOMi. Alert. Oriented x3. Cranial nerves II through XII intact. Muscle strength 5/5 upper and lower extremity. DTRs equal bilaterally.     Assessment & Plan:  1. Migraine without aura and without status migrainosus, not intractable Patient with increasing chronic migraine headaches, now affecting her up to 5 out of 7 days of the week. Discussed prophylactic treatment with propranolol, would also consider amitriptyline. We will perform routine labs to rule out other causes of increasing headaches. Patient was prescribed Flexeril to be used twice a day when necessary. Discussed the possibility of cranial manipulation in the future. - RPR - HIV antibody (with reflex) - TSH - B12 - Prolactin - cyclobenzaprine (FLEXERIL) 10 MG tablet; Take 1 tablet (10 mg total) by mouth 2 (two)  times daily as needed for muscle spasms.  Dispense: 60 tablet; Refill: 2 - If all labs are normal, will start either Inderal LA 80 mg daily or amitriptyline. Patient does have chronic pain amitriptyline might also help her with her chronic pain syndrome.

## 2015-01-24 NOTE — Patient Instructions (Signed)
Migraine Headache  A migraine headache is an intense, throbbing pain on one or both sides of your head. A migraine can last for 30 minutes to several hours.  CAUSES   The exact cause of a migraine headache is not always known. However, a migraine may be caused when nerves in the brain become irritated and release chemicals that cause inflammation. This causes pain.  Certain things may also trigger migraines, such as:  · Alcohol.  · Smoking.  · Stress.  · Menstruation.  · Aged cheeses.  · Foods or drinks that contain nitrates, glutamate, aspartame, or tyramine.  · Lack of sleep.  · Chocolate.  · Caffeine.  · Hunger.  · Physical exertion.  · Fatigue.  · Medicines used to treat chest pain (nitroglycerine), birth control pills, estrogen, and some blood pressure medicines.  SIGNS AND SYMPTOMS  · Pain on one or both sides of your head.  · Pulsating or throbbing pain.  · Severe pain that prevents daily activities.  · Pain that is aggravated by any physical activity.  · Nausea, vomiting, or both.  · Dizziness.  · Pain with exposure to bright lights, loud noises, or activity.  · General sensitivity to bright lights, loud noises, or smells.  Before you get a migraine, you may get warning signs that a migraine is coming (aura). An aura may include:  · Seeing flashing lights.  · Seeing bright spots, halos, or zigzag lines.  · Having tunnel vision or blurred vision.  · Having feelings of numbness or tingling.  · Having trouble talking.  · Having muscle weakness.  DIAGNOSIS   A migraine headache is often diagnosed based on:  · Symptoms.  · Physical exam.  · A CT scan or MRI of your head. These imaging tests cannot diagnose migraines, but they can help rule out other causes of headaches.  TREATMENT  Medicines may be given for pain and nausea. Medicines can also be given to help prevent recurrent migraines.   HOME CARE INSTRUCTIONS  · Only take over-the-counter or prescription medicines for pain or discomfort as directed by your  health care provider. The use of long-term narcotics is not recommended.  · Lie down in a dark, quiet room when you have a migraine.  · Keep a journal to find out what may trigger your migraine headaches. For example, write down:    What you eat and drink.    How much sleep you get.    Any change to your diet or medicines.  · Limit alcohol consumption.  · Quit smoking if you smoke.  · Get 7-9 hours of sleep, or as recommended by your health care provider.  · Limit stress.  · Keep lights dim if bright lights bother you and make your migraines worse.  SEEK IMMEDIATE MEDICAL CARE IF:   · Your migraine becomes severe.  · You have a fever.  · You have a stiff neck.  · You have vision loss.  · You have muscular weakness or loss of muscle control.  · You start losing your balance or have trouble walking.  · You feel faint or pass out.  · You have severe symptoms that are different from your first symptoms.  MAKE SURE YOU:   · Understand these instructions.  · Will watch your condition.  · Will get help right away if you are not doing well or get worse.     This information is not intended to replace advice given to you by   your health care provider. Make sure you discuss any questions you have with your health care provider.     Document Released: 01/28/2005 Document Revised: 02/18/2014 Document Reviewed: 10/05/2012  Elsevier Interactive Patient Education ©2016 [employer] Inc.  Occipital Neuralgia  Occipital neuralgia is a type of headache that causes episodes of very bad pain in the back of your head. Pain from occipital neuralgia may spread (radiate) to other parts of your head. The pain is usually brief and often goes away after you rest and relax.  These headaches may be caused by irritation of the nerves that leave your spinal cord high up in your neck, just below the base of your skull (occipital nerves). Your occipital nerves transmit sensations from the back of your head, the top of your head, and the areas behind  your ears.  CAUSES  Occipital neuralgia can occur without any known cause (primary headache syndrome). In other cases, occipital neuralgia is caused by pressure on or irritation of one of the two occipital nerves. Causes of occipital nerve compression or irritation include:  · Wear and tear of the vertebrae in the neck (osteoarthritis).  · Neck injury.  · Disease of the disks that separate the vertebrae.  · Tumors.  · Gout.  · Infections.  · Diabetes.  · Swollen blood vessels that put pressure on the occipital nerves.  · Muscle spasm in the neck.  SIGNS AND SYMPTOMS  Pain is the main symptom of occipital neuralgia. It usually starts in the back of the head but may also be felt in other areas supplied by the occipital nerves. Pain is usually on one side but may be on both sides. You may have:   · Brief episodes of very bad pain that is burning, stabbing, shocking, or shooting.  · Pain behind the eye.  · Pain triggered by neck movement or hair brushing.  · Scalp tenderness.  · Aching in the back of the head between episodes of very bad pain.  DIAGNOSIS   Your health care provider may diagnose occipital neuralgia based on your symptoms and a physical exam. During the exam, the health care provider may push on areas supplied by the occipital nerves to see if they are painful. Some tests may also be done to help in making the diagnosis. These may include:  · Imaging studies of the upper spinal cord, such as an MRI or CT scan. These may show compression or spinal cord abnormalities.  · Nerve block. You will get an injection of numbing medicine (local anesthetic) near the occipital nerve to see if this relieves pain.  TREATMENT   Treatment may begin with simple measures, such as:   · Rest.  · Massage.  · Heat.  · Over-the-counter pain relievers.  If these measures do not work, you may need other treatments, including:  · Medicines such as:  ¨ Prescription-strength anti-inflammatory medicines.  ¨ Muscle  relaxants.  ¨ Antiseizure medicines.  ¨ Antidepressants.  · Steroid injection. This involves injections of local anesthetic and strong anti-inflammatory drugs (steroids).  · Pulsed radiofrequency. Wires are implanted to deliver electrical impulses that block pain signals from the occipital nerve.  · Physical therapy.  · Surgery to relieve nerve pressure.  HOME CARE INSTRUCTIONS  · Take all medicines as directed by your health care provider.  · Avoid activities that cause pain.  · Rest when you have an attack of pain.  · Try gentle massage or a heating pad to relieve pain.  ·   Work with a physical therapist to learn stretching exercises you can do at home.  · Try a different pillow or sleeping position.  · Practice good posture.  · Try to stay active. Get regular exercise that does not cause pain. Ask your health care provider to suggest safe exercises for you.  · Keep all follow-up visits as directed by your health care provider. This is important.  SEEK MEDICAL CARE IF:  · Your medicine is not working.  · You have new or worsening symptoms.  SEEK IMMEDIATE MEDICAL CARE IF:  · You have very bad head pain that is not going away.  · You have a sudden change in vision, balance, or speech.  MAKE SURE YOU:  · Understand these instructions.  · Will watch your condition.  · Will get help right away if you are not doing well or get worse.     This information is not intended to replace advice given to you by your health care provider. Make sure you discuss any questions you have with your health care provider.     Document Released: 01/22/2001 Document Revised: 02/18/2014 Document Reviewed: 01/20/2013  Elsevier Interactive Patient Education ©2016 [employer] Inc[employer]

## 2015-01-25 ENCOUNTER — Telehealth: Payer: Self-pay | Admitting: Family Medicine

## 2015-01-25 DIAGNOSIS — E538 Deficiency of other specified B group vitamins: Secondary | ICD-10-CM

## 2015-01-25 LAB — PROLACTIN: Prolactin: 4.2 ng/mL

## 2015-01-25 LAB — RPR

## 2015-01-25 LAB — HIV ANTIBODY (ROUTINE TESTING W REFLEX): HIV 1&2 Ab, 4th Generation: NONREACTIVE

## 2015-01-25 MED ORDER — VITAMIN B-12 100 MCG PO TABS: 100.0000 ug | ORAL_TABLET | Freq: Every day | ORAL | Status: DC

## 2015-01-25 MED ORDER — AMITRIPTYLINE HCL 25 MG PO TABS: ORAL_TABLET | ORAL | Status: DC

## 2015-01-25 NOTE — Telephone Encounter (Signed)
Spoke with patient reviewed lab results and all instructions. Patient verbalized understanding. Patient has scheduled appt.

## 2015-01-25 NOTE — Telephone Encounter (Addendum)
Please call pt:  - her labs are normal with the exception of a low B12, which can cause increase in nerve pain. I would like her to be supplemented daily and I have called this supplement into her pharmacy.  - I would like to re-test her B12 by lab appt only (order in) in 8 weeks.  - I have also called in a medication called amitriptyline. This is not the same medication we talked about in the office, but it I feel she may get a better result and also may help with some of her sleep issues because it it taken before bed. I would not take Ambien with this medication until she sees if the amitriptyline sedates her enough, that she may not need ambien once we reach appropriate dose.  - Start at 25 mg at bedtime of amitriptyline then in 1 week taper to 50 mg daily, if headaches not controlled may increase to 75 mg week 3. Pt to follow up in 3-4 weeks regardless.  - FYI: do not discontinue this medicine abruptly, and not advised for use if becomes pregnant

## 2015-02-09 ENCOUNTER — Ambulatory Visit: Payer: BLUE CROSS/BLUE SHIELD | Admitting: Family Medicine

## 2015-02-12 ENCOUNTER — Emergency Department (HOSPITAL_BASED_OUTPATIENT_CLINIC_OR_DEPARTMENT_OTHER)
Admission: EM | Admit: 2015-02-12 | Discharge: 2015-02-12 | Disposition: A | Discharge status: Home / Self Care | Payer: BLUE CROSS/BLUE SHIELD | Attending: Emergency Medicine | Admitting: Emergency Medicine

## 2015-02-12 ENCOUNTER — Encounter (HOSPITAL_BASED_OUTPATIENT_CLINIC_OR_DEPARTMENT_OTHER): Payer: Self-pay | Admitting: Emergency Medicine

## 2015-02-12 ENCOUNTER — Emergency Department (HOSPITAL_BASED_OUTPATIENT_CLINIC_OR_DEPARTMENT_OTHER): Payer: BLUE CROSS/BLUE SHIELD

## 2015-02-12 DIAGNOSIS — F418 Other specified anxiety disorders: Secondary | ICD-10-CM | POA: Insufficient documentation

## 2015-02-12 DIAGNOSIS — Z880 Allergy status to penicillin: Secondary | ICD-10-CM | POA: Insufficient documentation

## 2015-02-12 DIAGNOSIS — Z87828 Personal history of other (healed) physical injury and trauma: Secondary | ICD-10-CM | POA: Diagnosis not present

## 2015-02-12 DIAGNOSIS — Z87440 Personal history of urinary (tract) infections: Secondary | ICD-10-CM | POA: Diagnosis not present

## 2015-02-12 DIAGNOSIS — Z87820 Personal history of traumatic brain injury: Secondary | ICD-10-CM | POA: Diagnosis not present

## 2015-02-12 DIAGNOSIS — Z87891 Personal history of nicotine dependence: Secondary | ICD-10-CM | POA: Diagnosis not present

## 2015-02-12 DIAGNOSIS — Z79899 Other long term (current) drug therapy: Secondary | ICD-10-CM | POA: Insufficient documentation

## 2015-02-12 DIAGNOSIS — Z8619 Personal history of other infectious and parasitic diseases: Secondary | ICD-10-CM | POA: Diagnosis not present

## 2015-02-12 DIAGNOSIS — G43809 Other migraine, not intractable, without status migrainosus: Secondary | ICD-10-CM | POA: Diagnosis not present

## 2015-02-12 DIAGNOSIS — G43909 Migraine, unspecified, not intractable, without status migrainosus: Secondary | ICD-10-CM | POA: Diagnosis present

## 2015-02-12 DIAGNOSIS — Z8719 Personal history of other diseases of the digestive system: Secondary | ICD-10-CM | POA: Insufficient documentation

## 2015-02-12 MED ORDER — KETOROLAC TROMETHAMINE 30 MG/ML IJ SOLN
30.0000 mg | Freq: Once | INTRAMUSCULAR | Status: AC
  Administered 2015-02-12: 30 mg via INTRAMUSCULAR
  Filled 2015-02-12: qty 1

## 2015-02-12 MED ORDER — VALPROIC ACID 250 MG PO CAPS: 250.0000 mg | ORAL_CAPSULE | Freq: Two times a day (BID) | ORAL | Status: DC

## 2015-02-12 MED ORDER — RIZATRIPTAN BENZOATE 10 MG PO TABS: ORAL_TABLET | ORAL | Status: DC

## 2015-02-12 MED ORDER — PROMETHAZINE HCL 25 MG/ML IJ SOLN
25.0000 mg | INTRAMUSCULAR | Status: DC | PRN
Start: 2015-02-12 — End: 2015-02-12
  Administered 2015-02-12: 25 mg via INTRAMUSCULAR
  Filled 2015-02-12: qty 1

## 2015-02-12 NOTE — ED Notes (Signed)
Pt with history of migraines, states she had migraine that started x 1 hour ago, states that theses feel different, c/o pain to left side of head and face

## 2015-02-12 NOTE — ED Provider Notes (Signed)
CSN: [id number]     Arrival date & time 02/12/15  1425 History  By signing my name below, I, Ronney Lion, attest that this documentation has been prepared under the direction and in the presence of Nelva Nay, MD. Electronically Signed: Ronney Lion, ED Scribe. 02/12/2015. 5:24 PM.   Chief Complaint  Patient presents with  . Migraine   The history is provided by the patient. No language interpreter was used.   HPI Comments: Terri [name] is a 30 y.o. female with a history of migraines, ICH closed head injury with seizure disorder for 2 years afterward, and depression with anxiety, who presents to the Emergency Department complaining of a sudden-onset, improving, left-sided headache that onset about 3 hours ago, at 1:30 PM, while driving. She rates her pain currently as 5/10 but states the severity earlier was at 10/10. She reports a history of migraines but states this one felt different, as she had associated left-sided facial numbness, which she had not gotten with past headaches. She also did not get blurred vision and nausea as she does with typical migraines, and her migraine did not onset gradually as it typically does. Patient states she started having migraines about 2-3 months ago after stopping Tramadol, which she had taken long-term following 2 hip surgeries. She was seen by her PCP and started on Maxalt prn, but 2 weeks ago was started on a preventative daily medication due to the increased frequency of her migraines. She states she has not been evaluated by a CT scan for her migraines.   Past Medical History  Diagnosis Date  . HSV infection     Genital clx pos HSV 2--2007.  . Drug overdose 2004    Xanax  . Depression with anxiety     Failed buspar.  Did well on citalopram.  . Brain injury (HCC) 30 yrs old    fell off babysitters cabinet, and on a separate occasion she fell down a flight of stairs  . Closed head injury About age 54 yrs    ICH per pt, with seizure disorder for appox  2 yrs afterward.  No significant residual disability.  . Seizure (HCC)     for 2 yrs after closed head injury/ICH  . NSVD (normal spontaneous vaginal delivery)     x 2.  One was given up for adoption (2008).  . Right hip pain 05/2009    MRI 10/12/09: Femoral head and neck stress injury plus small acetabular labral tear.  [facility] ortho (Dr. Caswell Corwin) as of 11/2012  . History of chlamydia   . H/O varicella   . Nausea 10/22/07    With menses  . Hx: UTI (urinary tract infection) 09/06/10  . IBS (irritable bowel syndrome)   . Allergy   . Hx of migraines    Past Surgical History  Procedure Laterality Date  . Wisdom tooth extraction    . Mole removal      on back  . Hip surgery  10/10/2010    Right hip GT bursectomy and osteophytectomy for intractable right greater troch pain (Dr. Despina Hick).  [facility] second opinion 06/2012--resulted in repeat MRI, then right hip IA injection that gave mild short term response, then was referred to PT with iontophoresis (as of 09/07/12)--was told to f/u in 6 wks.   Family History  Problem Relation Age of Onset  . Cancer Mother     Thyroid and breast/ remission  . Hepatitis Father     Hep C  . Alcohol abuse Father   .  Thyroid disease Sister   . Cancer Sister     thyroid/ remission  . Anxiety disorder Sister   . Thrombophlebitis Maternal Grandfather   . Anxiety disorder Maternal Grandfather   . Heart disease Maternal Grandfather   . Alzheimer's disease Paternal Grandfather    Social History  Substance Use Topics  . Smoking status: Former Smoker -- 0.30 packs/day    Types: Cigarettes    Quit date: 02/12/2007  . Smokeless tobacco: Never Used  . Alcohol Use: Yes     Comment: occasional   OB History    Gravida Para Term Preterm AB TAB SAB Ectopic Multiple Living   2 2 2       1      Review of Systems A complete 10 system review of systems was obtained and all systems are negative except as noted in the HPI and PMH.    Allergies  Penicillins  Home  Medications   Prior to Admission medications   Medication Sig Start Date End Date Taking? Authorizing Provider  HYDROcodone-acetaminophen (NORCO/VICODIN) 5-325 MG tablet Take 1 tablet by mouth daily as needed. 12/20/14  Yes Historical Provider, MD  vitamin B-12 (CYANOCOBALAMIN) 100 MCG tablet Take 1 tablet (100 mcg total) by mouth daily. 01/25/15  Yes Renee A Kuneff, DO  albuterol (PROVENTIL HFA;VENTOLIN HFA) 108 (90 BASE) MCG/ACT inhaler Inhale 2 puffs into the lungs every 6 (six) hours as needed for wheezing or shortness of breath. 12/01/14   Renee A Kuneff, DO  calcium carbonate (OS-CAL) 600 MG TABS Take 600 mg by mouth daily.      Historical Provider, MD  EMBEDA 30-1.2 MG CPCR  12/20/14   Historical Provider, MD  MedroxyPROGESTERone Acetate (DEPO-PROVERA IM) Inject into the muscle every 3 (three) months.      Historical Provider, MD  polyethylene glycol powder (GLYCOLAX/MIRALAX) powder 1 capful in 8 oz of water one to two times per day 05/10/14   Jeoffrey Massed, MD  rizatriptan (MAXALT) 10 MG tablet May repeat in 2 hours if needed--max of 30mg  in 24h period. 02/12/15   Nelva Nay, MD  valproic acid (DEPAKENE) 250 MG capsule Take 1 capsule (250 mg total) by mouth 2 (two) times daily. 02/12/15   Nelva Nay, MD   BP 106/79 mmHg  Pulse 93  Temp(Src) 98.1 F (36.7 C) (Oral)  Resp 18  Ht 5\' 5"  (1.651 m)  Wt 129 lb (58.514 kg)  BMI 21.47 kg/m2  SpO2 100% Physical Exam Physical Exam  Nursing note and vitals reviewed. Constitutional: She is oriented to person, place, and time. She appears well-developed and well-nourished. No distress.  HENT:  Head: Normocephalic and atraumatic.  Eyes: Pupils are equal, round, and reactive to light.  Neck: Normal range of motion.  Cardiovascular: Normal rate and intact distal pulses.   Pulmonary/Chest: No respiratory distress.  Abdominal: Normal appearance. She exhibits no distension.  Musculoskeletal: Normal range of motion.  Neurological: She is  alert and oriented to person, place, and time. No cranial nerve deficit.  No motor deficit to exam.  No sensory deficit.  No cerebellar abnormalities.   Skin: Skin is warm and dry. No rash noted.    ED Course  Procedures (including critical care time) Medications  ketorolac (TORADOL) 30 MG/ML injection 30 mg (not administered)  promethazine (PHENERGAN) injection 25 mg (not administered)     DIAGNOSTIC STUDIES: Oxygen Saturation is 100% on RA, normal by my interpretation.    COORDINATION OF CARE: 4:00 PM - Discussed treatment plan with pt  at bedside. Pt verbalized understanding and agreed to plan.   Labs Review Labs Reviewed - No data to display  Imaging Review Ct Head Wo Contrast  02/12/2015  CLINICAL DATA:  30 year old female with acute left-sided headache today. EXAM: CT HEAD WITHOUT CONTRAST TECHNIQUE: Contiguous axial images were obtained from the base of the skull through the vertex without intravenous contrast. COMPARISON:  01/24/2008 CT FINDINGS: No intracranial abnormalities are identified, including mass lesion or mass effect, hydrocephalus, extra-axial fluid collection, midline shift, hemorrhage, or acute infarction. The visualized bony calvarium is unremarkable. IMPRESSION: Unremarkable noncontrast head CT. Electronically Signed   By: Harmon PierJeffrey  Hu M.D.   On: 02/12/2015 16:32   I have personally reviewed and evaluated these images and lab results as part of my medical decision-making.   EKG Interpretation None      MDM   Final diagnoses:  Other migraine without status migrainosus, not intractable    I personally performed the services described in this documentation, which was scribed in my presence. The recorded information has been reviewed and considered.    Nelva Nayobert Pessy Delamar, MD 02/12/15 [phone]

## 2015-02-12 NOTE — Discharge Instructions (Signed)

## 2015-02-20 ENCOUNTER — Ambulatory Visit (INDEPENDENT_AMBULATORY_CARE_PROVIDER_SITE_OTHER): Payer: BLUE CROSS/BLUE SHIELD | Admitting: Family Medicine

## 2015-02-20 ENCOUNTER — Encounter: Payer: Self-pay | Admitting: Family Medicine

## 2015-02-20 DIAGNOSIS — G43009 Migraine without aura, not intractable, without status migrainosus: Secondary | ICD-10-CM | POA: Diagnosis not present

## 2015-02-20 MED ORDER — PROPRANOLOL HCL 80 MG PO TABS: 80.0000 mg | ORAL_TABLET | Freq: Three times a day (TID) | ORAL | Status: DC

## 2015-02-20 NOTE — Progress Notes (Signed)
Subjective:    Patient ID: Terri [name], female    DOB: 1985/09/10, 30 y.o.   MRN: [id number]  HPI   Migraine: Patient presents for follow-up on her migraines today. Patient has a history of closed head injury/brain injury when she was 30 years old. She has had increase in frequency of migraines. She was started on amitriptyline 25 mg last visit was advised to taper up her dose 25 mg per week. Patient states she cut to 50 mg a day. She is taking this approximately 8 PM at night, and although she enjoyed being able to sleep through the night on this medication, she states she did feel groggy in the morning. Patient has had difficulty with insomnia in the past, as well as chronic pain, and this is why amitriptyline was considered for use for her migraines. Patient states that her migraine counted reduce on his medications for approximately 3 weeks. However a few weeks into her medications she had a headache that she states was not like her priors. She reports she was out of her Maxalt at the time, and had no abortive therapy, it was the weekend on a holiday. She therefore had to go to the emergency room for treatment. She states the headache came on peak week, which is not her usual. She felt numbing on the left side of her face her left hand as well. A CAT scan was completed, and was normal. Patient was advised to discontinue the amitriptyline, and was placed on Depakote. Patient states she had severe nausea on the Depakote. She figured that would decrease with times that she continue to take it for a few more additional days, and then had a quit altogether secondary to nausea. Patient has not had current facial numbness.  Past Medical History  Diagnosis Date  . HSV infection     Genital clx pos HSV 2--2007.  . Drug overdose 2004    Xanax  . Depression with anxiety     Failed buspar.  Did well on citalopram.  . Brain injury (HCC) 30 yrs old    fell off babysitters cabinet, and on a separate  occasion she fell down a flight of stairs  . Closed head injury About age 46 yrs    ICH per pt, with seizure disorder for appox 2 yrs afterward.  No significant residual disability.  . Seizure (HCC)     for 2 yrs after closed head injury/ICH  . NSVD (normal spontaneous vaginal delivery)     x 2.  One was given up for adoption (2008).  . Right hip pain 05/2009    MRI 10/12/09: Femoral head and neck stress injury plus small acetabular labral tear.  [facility] ortho (Dr. Caswell CorwinStubbs) as of 11/2012  . History of chlamydia   . H/O varicella   . Nausea 10/22/07    With menses  . Hx: UTI (urinary tract infection) 09/06/10  . IBS (irritable bowel syndrome)   . Allergy   . Hx of migraines    Allergies  Allergen Reactions  . Penicillins Rash   Review of Systems Negative, with the exception of above mentioned in HPI     Objective:   Physical Exam BP 106/70 mmHg  Pulse 84  Temp(Src) 98.3 F (36.8 C)  Resp 20  Wt 131 lb (59.421 kg)  SpO2 95% Gen: Afebrile. No acute distress. Nontoxic in appearance. Well developed, well nourished. Female. Pleasant.  HENT: AT. [place].MMM. Eyes:Pupils Equal Round Reactive to light, Extraocular movements intact,  Conjunctiva without redness, discharge or icterus. CV: RRR  Skin: No rashes, purpura or petechiae.  Neuro: Normal gait. PERLA. EOMi. Alert. Oriented x3. Cranial nerves II through XII intact. Muscle strength 5/5 UE/LE extremity. DTRs equal bilaterally. Psych: Normal affect, dress and demeanor. Normal speech. Normal thought content and judgment.     Assessment & Plan:  1. Migraine without aura and without status migrainosus, not intractable - pt not taking Depakote or amitriptyline now. ED stopped amitriptyline and pt stopped Depakote for severe nausea.  - Amitriptyline started to become affective, lowering her weekly headaches amounts, but not completely resolved, but never got above 50 mg dose. Pt experienced severe headache with left sided facial numbness,  possibly migraine vs amitriptyline reaction. resolved with migraine treatment and CT was normal.  - will try inderal TID and follow up 4 weeks.  - propranolol (INDERAL) 80 MG tablet; Take 1 tablet (80 mg total) by mouth 3 (three) times daily.  Dispense: 90 tablet; Refill: 1 - 4 week follow up.

## 2015-02-20 NOTE — Patient Instructions (Signed)
Start inderal 80 mg three times a day for migraine prophylaxis.  I will need to follow up in 4 weeks, Please write down your amount of headaches/duration. You have refills on Maxalt. Do not hesitate to call in if you need more refills.

## 2015-02-21 ENCOUNTER — Ambulatory Visit: Payer: BLUE CROSS/BLUE SHIELD | Admitting: Family Medicine

## 2015-02-27 ENCOUNTER — Telehealth: Payer: Self-pay | Admitting: *Deleted

## 2015-02-27 DIAGNOSIS — G43909 Migraine, unspecified, not intractable, without status migrainosus: Secondary | ICD-10-CM

## 2015-02-27 NOTE — Telephone Encounter (Signed)
Patient states recent Beta Blocker you started her on is causing too many side effects. She states she is having nausea, shortness of breath and has stopped taking it she wants something else called in. Please advise.

## 2015-02-27 NOTE — Telephone Encounter (Signed)
Since pt seems to be having side effects to both medications tried for her migraines, I am going to send her to a neurology referral to help with evaluation of her headaches.

## 2015-02-28 NOTE — Telephone Encounter (Signed)
Spoke with patient reviewed information. Patient willing to see Neurology.

## 2015-03-07 ENCOUNTER — Ambulatory Visit (INDEPENDENT_AMBULATORY_CARE_PROVIDER_SITE_OTHER): Payer: BLUE CROSS/BLUE SHIELD | Admitting: *Deleted

## 2015-03-07 ENCOUNTER — Ambulatory Visit: Payer: BLUE CROSS/BLUE SHIELD

## 2015-03-07 DIAGNOSIS — Z309 Encounter for contraceptive management, unspecified: Secondary | ICD-10-CM | POA: Diagnosis not present

## 2015-03-07 MED ORDER — MEDROXYPROGESTERONE ACETATE 150 MG/ML IM SUSP
150.0000 mg | Freq: Once | INTRAMUSCULAR | Status: AC
  Administered 2015-03-07: 150 mg via INTRAMUSCULAR

## 2015-03-21 ENCOUNTER — Ambulatory Visit: Payer: BLUE CROSS/BLUE SHIELD | Admitting: Family Medicine

## 2015-03-22 ENCOUNTER — Encounter: Payer: Self-pay | Admitting: Neurology

## 2015-03-22 ENCOUNTER — Ambulatory Visit (INDEPENDENT_AMBULATORY_CARE_PROVIDER_SITE_OTHER): Payer: BLUE CROSS/BLUE SHIELD | Admitting: Neurology

## 2015-03-22 VITALS — BP 104/62 | HR 92 | Ht 65.0 in | Wt 129.0 lb

## 2015-03-22 DIAGNOSIS — G43709 Chronic migraine without aura, not intractable, without status migrainosus: Secondary | ICD-10-CM

## 2015-03-22 MED ORDER — SUMATRIPTAN SUCCINATE 11 MG/NOSEPC NA EXHP: 11.0000 mg | INHALANT_POWDER | Freq: Once | NASAL | Status: DC

## 2015-03-22 NOTE — Patient Instructions (Signed)
Migraine Recommendations: 1.  Start magnesium citrate  daily, riboflavin  daily and coenzyme Q10  three times daily 2.  Take Suzan Nailer (1 cap in each nostril) at earliest onset of headache.  May repeat dose (1 cap in each nostril) once in 2 hours if needed.  Do not exceed two doses in 24 hours. 3.  Limit use of pain relievers to no more than 2 days out of the week.  These medications include acetaminophen, ibuprofen, triptans and narcotics.  This will help reduce risk of rebound headaches. 4.  Be aware of common food triggers such as processed sweets, processed foods with nitrites (such as deli meat, hot dogs, sausages), foods with MSG, alcohol (such as wine), chocolate, certain cheeses, certain fruits (dried fruits, some citrus fruit), vinegar, diet soda. 4.  Avoid caffeine 5.  Routine exercise 6.  Proper sleep hygiene 7.  Stay adequately hydrated with water 8.  Keep a headache diary. 9.  Maintain proper stress management. 10.  Do not skip meals. 11.  CALL IN 4 WEEKS WITH UPDATE.  Follow up in 4 months

## 2015-03-22 NOTE — Progress Notes (Signed)
NEUROLOGY CONSULTATION NOTE  Terri [name] MRN: [id number] DOB: [birth year]  Referring provider: Dr. Milinda Cave Primary care provider: Dr. Milinda Cave  Reason for consult:  headache  HISTORY OF PRESENT ILLNESS: Terri [name] is a 30 year old right-handed female with depression, IBS, migraines, asthma, depression with anxiety and history of closed head injury with ICH with subsequent symptomatic seizures who presents for migraines.  History obtained by patient and PCP note.  Imaging of head CT reviewed.  Onset:  2014, after discontinuing daily use of tramadol for hip injury Location:  Usually right sided, either front to back or just frontal Quality:  Usually sharp, sometimes throbbing Intensity:  5-6/10, 8-9/10 when severe Aura:  no Prodrome:  no Associated symptoms:  Initially had nausea.  No photophobia, phonophobia or visual disturbance Duration:  6 hours severe headache followed by dull lingering headache for a couple of days Frequency:  Headache 5 out of 7 days per week (severe 5 days per month) Triggers/exacerbating factors:  Hydrocodone, extreme cold, wine Relieving factors:  rest Activity:  Unable to function 5 days per month.  CT of head from 02/12/15 was unremarkable.  Past NSAIDS:  ibuprofen Past analgesics:  tramadol , hydrocodone Past abortive triptans:  none Past muscle relaxants:  cyclobenzaprine Past anti-nausea:  none Past antihypertensive medications:  Propranolol (asthma) Past antidepressant medications:  amitriptyline , citalopram Past anticonvulsant medications:  Depakene, Lyrica Past vitamins/Herbal/Supplements:  none Past antihistamines/decongestants:  none  Current NSAIDS:  none Current analgesics:  Morphine (for hip pain) Current triptans:  Maxalt  Current anti-nausea:  none Current muscle relaxants:  baclofen  twice daily as needed. Current Antihypertensive medications:  none Current Antidepressant medications:  none Current  Anticonvulsant medications: none Current Vitamins/Herbal/Supplements:  B12 Current Antihistamines/Decongestants:  none Other medication:  Ambien, Depo-Provera, clonazepam  Caffeine:  Coffee daily, decreased soda intake Alcohol:  occasionally Smoker:  no Diet:  Fast food, water Exercise:  Not routine Depression/stress:  okay Sleep hygiene:  poor Family history of headache:  No No prior history of migraines.  She sustained traumatic ICH as a toddler and was on antiepileptic medications for 1 year.  She has not had any recurrent seizures.  PAST MEDICAL HISTORY: Past Medical History  Diagnosis Date  . HSV infection     Genital clx pos HSV 2--2007.  . Drug overdose 2004    Xanax  . Depression with anxiety     Failed buspar.  Did well on citalopram.  . Brain injury (HCC) 30 yrs old    fell off babysitters cabinet, and on a separate occasion she fell down a flight of stairs  . Closed head injury About age 59 yrs    ICH per pt, with seizure disorder for appox 2 yrs afterward.  No significant residual disability.  . Seizure (HCC)     for 2 yrs after closed head injury/ICH  . NSVD (normal spontaneous vaginal delivery)     x 2.  One was given up for adoption (2008).  . Right hip pain 05/2009    MRI 10/12/09: Femoral head and neck stress injury plus small acetabular labral tear.  [facility] ortho (Dr. Caswell Corwin) as of 11/2012  . History of chlamydia   . H/O varicella   . Nausea 10/22/07    With menses  . Hx: UTI (urinary tract infection) 09/06/10  . IBS (irritable bowel syndrome)   . Allergy   . Hx of migraines     PAST SURGICAL HISTORY: Past Surgical History  Procedure Laterality Date  .  Wisdom tooth extraction    . Mole removal      on back  . Hip surgery  10/10/2010    Right hip GT bursectomy and osteophytectomy for intractable right greater troch pain (Dr. Despina Hick).  [facility] second opinion 06/2012--resulted in repeat MRI, then right hip IA injection that gave mild short term response, then was  referred to PT with iontophoresis (as of 09/07/12)--was told to f/u in 6 wks.    MEDICATIONS: Current Outpatient Prescriptions on File Prior to Visit  Medication Sig Dispense Refill  . albuterol (PROVENTIL HFA;VENTOLIN HFA) 108 (90 BASE) MCG/ACT inhaler Inhale 2 puffs into the lungs every 6 (six) hours as needed for wheezing or shortness of breath. 1 Inhaler 0  . baclofen (LIORESAL) 10 MG tablet TAKE 1 TABLET BY MOUTH 2 TIMES DAILY AS NEEDED  2  . MedroxyPROGESTERone Acetate (DEPO-PROVERA IM) Inject into the muscle every 3 (three) months.      . polyethylene glycol powder (GLYCOLAX/MIRALAX) powder 1 capful in 8 oz of water one to two times per day 850 g 3  . rizatriptan (MAXALT) 10 MG tablet May repeat in 2 hours if needed--max of  in 24h period. 10 tablet 2  . vitamin B-12 (CYANOCOBALAMIN) 100 MCG tablet Take 1 tablet (100 mcg total) by mouth daily. 60 tablet 0  . calcium carbonate (OS-CAL) 600 MG TABS Take 600 mg by mouth daily. Reported on 03/22/2015    . [DISCONTINUED] amitriptyline (ELAVIL) 25 MG tablet Take 25 mg daily for 7 days, then increase to 50 mg daily for 7 days, then may increase to 75 mg daily. 63 tablet 0   No current facility-administered medications on file prior to visit.    ALLERGIES: Allergies  Allergen Reactions  . Penicillins Rash    FAMILY HISTORY: Family History  Problem Relation Age of Onset  . Cancer Mother     Thyroid and breast/ remission  . Hepatitis Father     Hep C  . Alcohol abuse Father   . Thyroid disease Sister   . Cancer Sister     thyroid/ remission  . Anxiety disorder Sister   . Thrombophlebitis Maternal Grandfather   . Anxiety disorder Maternal Grandfather   . Heart disease Maternal Grandfather   . Alzheimer's disease Paternal Grandfather     SOCIAL HISTORY: Social History   Social History  . Marital Status: Single    Spouse Name: N/A  . Number of Children: N/A  . Years of Education: N/A   Occupational History  . Not on  file.   Social History Main Topics  . Smoking status: Former Smoker -- 0.30 packs/day    Types: Cigarettes    Quit date: 02/12/2007  . Smokeless tobacco: Never Used  . Alcohol Use: Yes     Comment: occasional  . Drug Use: No  . Sexual Activity:    Partners: Male    Birth Control/ Protection: Condom, Coitus interruptus   Other Topics Concern  . Not on file   Social History Narrative   Single.     Lives in [place] with her mom and dad and her 15 y/o son [name].   Works as a Mining engineer with mental/developmental disabilities.   Describes somewhat wild teenage years, "made some bad choices and hung out with the wrong people".  However, after that she had no issues with drugs or alcohol.   Reports that she had a second child a couple of years after her son [name] (2008) and she  gave him/her up for adoption right after birth.                     REVIEW OF SYSTEMS: Constitutional: No fevers, chills, or sweats, no generalized fatigue, change in appetite Eyes: No visual changes, double vision, eye pain Ear, nose and throat: No hearing loss, ear pain, nasal congestion, sore throat Cardiovascular: No chest pain, palpitations Respiratory:  No shortness of breath at rest or with exertion, wheezes GastrointestinaI: No nausea, vomiting, diarrhea, abdominal pain, fecal incontinence Genitourinary:  No dysuria, urinary retention or frequency Musculoskeletal:  No neck pain, back pain Integumentary: No rash, pruritus, skin lesions Neurological: as above Psychiatric: No depression, insomnia, anxiety Endocrine: No palpitations, fatigue, diaphoresis, mood swings, change in appetite, change in weight, increased thirst Hematologic/Lymphatic:  No anemia, purpura, petechiae. Allergic/Immunologic: no itchy/runny eyes, nasal congestion, recent allergic reactions, rashes  PHYSICAL EXAM: Filed Vitals:   03/22/15 1422  BP: 104/62  Pulse: 92   General: No acute distress.  Patient  appears well-groomed.  Head:  Normocephalic/atraumatic Eyes:  fundi unremarkable, without vessel changes, exudates, hemorrhages or papilledema. Neck: supple, no paraspinal tenderness, full range of motion Back: No paraspinal tenderness Heart: regular rate and rhythm Lungs: Clear to auscultation bilaterally. Vascular: No carotid bruits. Neurological Exam: Mental status: alert and oriented to person, place, and time, recent and remote memory intact, fund of knowledge intact, attention and concentration intact, speech fluent and not dysarthric, language intact. Cranial nerves: CN I: not tested CN II: pupils equal, round and reactive to light, visual fields intact, fundi unremarkable, without vessel changes, exudates, hemorrhages or papilledema. CN III, IV, VI:  full range of motion, no nystagmus, no ptosis CN V: facial sensation intact CN VII: upper and lower face symmetric CN VIII: hearing intact CN IX, X: gag intact, uvula midline CN XI: sternocleidomastoid and trapezius muscles intact CN XII: tongue midline Bulk & Tone: normal, no fasciculations. Motor:  5/5 throughout  Sensation: temperature and vibration sensation intact. Deep Tendon Reflexes:  2+ throughout, toes downgoing.  Finger to nose testing:  Without dysmetria.  Heel to shin:  Without dysmetria.  Gait:  Normal station and stride.  Able to turn and tandem walk. Romberg negative.  IMPRESSION: Chronic migraine  PLAN: She would like to avoid preventative medications, especially antidepressants. 1.  Try magnesium citrate 400mg  daily, riboflavin 400mg  daily and coenzyme Q10 100mg  three times daily 2.  Will try Onzetra spray for faster onset of action 3.  Lifestyle modification:  Improve diet, exercise, sleep hygiene 4.  She will call in 4 weeks with update.  If headaches not improved, will then consider topiramate 5.  Follow up 3 to 4 months.  Thank you for allowing me to take part in the care of this patient.  Shon Millet, DO  CC:  Nicoletta Ba, MD

## 2015-03-24 ENCOUNTER — Encounter: Payer: Self-pay | Admitting: Family Medicine

## 2015-05-25 ENCOUNTER — Ambulatory Visit (INDEPENDENT_AMBULATORY_CARE_PROVIDER_SITE_OTHER): Payer: BLUE CROSS/BLUE SHIELD | Admitting: *Deleted

## 2015-05-25 DIAGNOSIS — Z309 Encounter for contraceptive management, unspecified: Secondary | ICD-10-CM

## 2015-05-25 MED ORDER — MEDROXYPROGESTERONE ACETATE 150 MG/ML IM SUSP
150.0000 mg | Freq: Once | INTRAMUSCULAR | Status: AC
  Administered 2015-05-25: 150 mg via INTRAMUSCULAR

## 2015-05-25 NOTE — Progress Notes (Signed)
Patient presents today for Depo Provera injection. Patient tolerated injection well. Next injection in 3 months.

## 2015-05-30 ENCOUNTER — Telehealth: Payer: Self-pay | Admitting: Family Medicine

## 2015-05-30 ENCOUNTER — Encounter: Payer: Self-pay | Admitting: Family Medicine

## 2015-05-30 ENCOUNTER — Ambulatory Visit (INDEPENDENT_AMBULATORY_CARE_PROVIDER_SITE_OTHER): Payer: BLUE CROSS/BLUE SHIELD | Admitting: Family Medicine

## 2015-05-30 VITALS — BP 103/67 | HR 89 | Temp 98.6°F | Resp 20 | Ht 65.0 in | Wt 123.5 lb

## 2015-05-30 DIAGNOSIS — E538 Deficiency of other specified B group vitamins: Secondary | ICD-10-CM | POA: Diagnosis not present

## 2015-05-30 DIAGNOSIS — Z130 Encounter for screening for diseases of the blood and blood-forming organs and certain disorders involving the immune mechanism: Secondary | ICD-10-CM | POA: Diagnosis not present

## 2015-05-30 DIAGNOSIS — E559 Vitamin D deficiency, unspecified: Secondary | ICD-10-CM | POA: Insufficient documentation

## 2015-05-30 DIAGNOSIS — Z0000 Encounter for general adult medical examination without abnormal findings: Secondary | ICD-10-CM | POA: Diagnosis not present

## 2015-05-30 DIAGNOSIS — F1190 Opioid use, unspecified, uncomplicated: Secondary | ICD-10-CM | POA: Insufficient documentation

## 2015-05-30 DIAGNOSIS — Z87820 Personal history of traumatic brain injury: Secondary | ICD-10-CM

## 2015-05-30 LAB — CBC WITH DIFFERENTIAL/PLATELET
Basophils Absolute: 0 10*3/uL (ref 0.0–0.1)
Basophils Relative: 0.5 % (ref 0.0–3.0)
Eosinophils Absolute: 0.1 10*3/uL (ref 0.0–0.7)
Eosinophils Relative: 1.4 % (ref 0.0–5.0)
HCT: 37.7 % (ref 36.0–46.0)
Hemoglobin: 12.6 g/dL (ref 12.0–15.0)
Lymphocytes Relative: 29.3 % (ref 12.0–46.0)
Lymphs Abs: 1.8 10*3/uL (ref 0.7–4.0)
MCHC: 33.5 g/dL (ref 30.0–36.0)
MCV: 92.2 fl (ref 78.0–100.0)
Monocytes Absolute: 0.7 10*3/uL (ref 0.1–1.0)
Monocytes Relative: 11.4 % (ref 3.0–12.0)
Neutro Abs: 3.5 10*3/uL (ref 1.4–7.7)
Neutrophils Relative %: 57.4 % (ref 43.0–77.0)
Platelets: 247 10*3/uL (ref 150.0–400.0)
RBC: 4.09 Mil/uL (ref 3.87–5.11)
RDW: 12.9 % (ref 11.5–15.5)
WBC: 6.2 10*3/uL (ref 4.0–10.5)

## 2015-05-30 LAB — COMPREHENSIVE METABOLIC PANEL
ALT: 21 U/L (ref 0–35)
AST: 20 U/L (ref 0–37)
Albumin: 4.3 g/dL (ref 3.5–5.2)
Alkaline Phosphatase: 43 U/L (ref 39–117)
BUN: 11 mg/dL (ref 6–23)
CO2: 30 mEq/L (ref 19–32)
Calcium: 9.5 mg/dL (ref 8.4–10.5)
Chloride: 106 mEq/L (ref 96–112)
Creatinine, Ser: 0.64 mg/dL (ref 0.40–1.20)
GFR: 116.15 mL/min (ref >60.00)
Glucose, Bld: 86 mg/dL (ref 70–99)
Potassium: 3.9 mEq/L (ref 3.5–5.1)
Sodium: 140 mEq/L (ref 135–145)
Total Bilirubin: 0.4 mg/dL (ref 0.2–1.2)
Total Protein: 7.1 g/dL (ref 6.0–8.3)

## 2015-05-30 LAB — VITAMIN B12: Vitamin B-12: >1500 pg/mL — ABNORMAL HIGH (ref 211–911)

## 2015-05-30 LAB — VITAMIN D 25 HYDROXY (VIT D DEFICIENCY, FRACTURES): VITD: 20.71 ng/mL — ABNORMAL LOW (ref 30.00–100.00)

## 2015-05-30 MED ORDER — ALBUTEROL SULFATE HFA 108 (90 BASE) MCG/ACT IN AERS: 2.0000 | INHALATION_SPRAY | Freq: Four times a day (QID) | RESPIRATORY_TRACT | Status: DC | PRN

## 2015-05-30 MED ORDER — VITAMIN D (ERGOCALCIFEROL) 1.25 MG (50000 UNIT) PO CAPS: 50000.0000 [IU] | ORAL_CAPSULE | ORAL | Status: DC

## 2015-05-30 NOTE — Progress Notes (Signed)
Patient ID: Terri [name], female   DOB: May 28, 1985, 31 y.o.   MRN: [id number]      Patient ID: Terri [name], female  DOB: August 05, 1985, 30 y.o.   MRN: [id number]  Subjective:  Terri [name] is a 30 y.o. female present for CPE. All past medical history, surgical history, allergies, family history, immunizations, medications and social history were updated in the electronic medical record today. All recent labs, ED visits and hospitalizations within the last year were reviewed.  Health maintenance:  Colonoscopy: No FHX, screen at 50. Mammogram: Breast cancer in her mother (85). Screen at 40, consider baseline earlier.   Cervical cancer screening: 2015 no abnormal;  [facility]. 3 years follow up recommended.  Immunizations: UTD flu and tdap Infectious disease screening: HIV completed  DEXA:Not indicated.  Assistive device: None Oxygen NWG:NFAO Patient has a Dental home, however unable to afford q 6 month visit secondary to no dental insurance.  Hospitalizations/ED visits: 1 ED visit 02/12/2015. Reviewed.  Immunization History  Administered Date(s) Administered  . Influenza Split 12/12/2010  . Influenza,inj,Quad PF,36+ Mos 11/24/2012, 12/07/2014  . Influenza-Unspecified 11/08/2013  . PPD Test 04/08/2011, 07/13/2012, 08/30/2013, 09/06/2013, 09/06/2014  . Td 01/24/2008    Past Medical History  Diagnosis Date  . HSV infection     Genital clx pos HSV 2--2007.  . Drug overdose 2004    Xanax  . Depression with anxiety     Failed buspar.  Did well on citalopram.  . Brain injury (HCC) 30 yrs old    fell off babysitters cabinet, and on a separate occasion she fell down a flight of stairs  . Closed head injury About age 52 yrs    ICH per pt, with seizure disorder for appox 2 yrs afterward.  No significant residual disability.  . Seizure (HCC)     for 2 yrs after closed head injury/ICH  . Right hip pain 05/2009    MRI 10/12/09: Femoral head and neck stress injury plus small  acetabular labral tear.  [facility] ortho (Dr. Caswell Corwin) as of 11/2012  . History of chlamydia   . H/O varicella   . Hx: UTI (urinary tract infection) 09/06/10  . IBS (irritable bowel syndrome)   . Allergy   . Hx of migraines     chronic migraines (Dr. Everlena Cooper) 03/2015   Allergies  Allergen Reactions  . Penicillins Rash   Past Surgical History  Procedure Laterality Date  . Wisdom tooth extraction    . Mole removal      on back  . Hip surgery  10/10/2010    Right hip GT bursectomy and osteophytectomy for intractable right greater troch pain (Dr. Despina Hick).  [facility] second opinion 06/2012--resulted in repeat MRI, then right hip IA injection that gave mild short term response, then was referred to PT with iontophoresis (as of 09/07/12)--was told to f/u in 6 wks.   Family History  Problem Relation Age of Onset  . Breast cancer Mother 61  . Thyroid cancer Mother   . Hepatitis Father     Hep C  . Alcohol abuse Father   . Thyroid disease Sister   . Anxiety disorder Sister   . Thyroid cancer Sister   . Thrombophlebitis Maternal Grandfather   . Anxiety disorder Maternal Grandfather   . Heart disease Maternal Grandfather   . Alzheimer's disease Paternal Grandfather    Social History   Social History  . Marital Status: Single    Spouse Name: N/A  . Number of  Children: 1  . Years of Education: N/A   Occupational History  . Not on file.   Social History Main Topics  . Smoking status: Former Smoker -- 0.30 packs/day    Types: Cigarettes    Quit date: 02/12/2007  . Smokeless tobacco: Never Used  . Alcohol Use: Yes     Comment: occasional  . Drug Use: No  . Sexual Activity:    Partners: Male    Birth Control/ Protection: Condom, Coitus interruptus   Other Topics Concern  . Not on file   Social History Narrative   Single.     Lives in [place] with her mom and dad and her 30 y/o son [name].   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   Describes  somewhat wild teenage years, "made some bad choices and hung out with the wrong people".  However, after that she had no issues with drugs or alcohol.   Reports that she had a second child a couple of years after her son [name] (2008) and she gave him/her up for adoption right after birth.                      ROS: Negative, with the exception of above mentioned in HPI  Objective: BP 103/67 mmHg  Pulse 89  Temp(Src) 98.6 F (37 C) (Oral)  Resp 20  Ht 5\' 5"  (1.651 m)  Wt 123 lb 8 oz (56.019 kg)  BMI 20.55 kg/m2  SpO2 97% Gen: Afebrile. No acute distress. Nontoxic in appearance, well-developed, well-nourished, female, pleasant thin caucasian female.  HENT: AT. [place]. Bilateral TM visualized and normal in appearance, normal external auditory canal. MMM, no oral lesions, Good dentition. Bilateral nares mild swelling, no erythema. Throat without erythema, ulcerations or exudates. No Cough on exam, No hoarseness on exam. Eyes:Pupils Equal Round Reactive to light, Extraocular movements intact,  Conjunctiva without redness, discharge or icterus. Neck/lymp/endocrine: Supple,No lymphadenopathy, no thyromegaly CV: RRR no murmur, no edema, +2/4 P posterior tibialis pulses.  Chest: CTAB, no wheeze, rhonchi or crackles. Normal Respiratory effort. Good Air movement. Abd: Soft. Striae, flat. NTND. BS present. No Masses palpated. No hepatosplenomegaly. No rebound tenderness or guarding. Skin: No rashes, purpura or petechiae. Warm and well-perfused. Skin intact. Neuro/Msk: Normal gait. PERLA. EOMi. Alert. Oriented x3.  Cranial nerves II through XII intact. Muscle strength 5/5 UE/LE extremity. DTRs equal bilaterally. Psych: Normal affect, dress and demeanor. Normal speech. Normal thought content and judgment.  Assessment/plan: Terri [name] is a 30 y.o. female present for annual exam/preventive exam.  Encounter for preventive health examination Screening, anemia, deficiency, iron B12  deficiency Vitamin D deficiency - CBC w/Diff - Comprehensive metabolic panel - VITAMIN D 25 Hydroxy (Vit-D Deficiency, Fractures) - B12: repeat, has been supplementing with B12 oral OTC since low result in 01/2015 - try using daily stool softener to help with constipation with calcium use. Pt has been on depo provera for multiple years and could use the extra calcium. Test Vit D for absorption of calcium today.  - Form completed for camp physical.  Colonoscopy: No FHX, screen at 50. Mammogram: Breast cancer in her mother (8345). Screen at 40, consider baseline earlier.   Cervical cancer screening: 2015 no abnormal;  [facility]. 3 years follow up recommended.  Immunizations: UTD flu and tdap Infectious disease screening: HIV completed  DEXA:Not indicated.  Patient was encouraged to exercise greater than 150 minutes a week. Patient was encouraged to choose a diet  filled with fresh fruits and vegetables, and lean meats. AVS provided to patient today for education/recommendation on gender specific health and safety maintenance. Chronic pain with chronic narcotic use: continue to follow with pain clinc ([facility] pain mgmt), prescribed Morphine and baclofen.   Return in about 1 year (around 05/29/2016) for CPE.  Electronically signed by: Felix Pacini, DO [facility] [place]

## 2015-05-30 NOTE — Telephone Encounter (Signed)
Please call pt: - all labs are normal, with the exception of her Vitamin D is low (20). Should be at least in 30s preferably 40s. I have called in a once a week supplement for 12 weeks, she is to then start taking OTC 800 u daily with meal.

## 2015-05-30 NOTE — Patient Instructions (Signed)
Health Maintenance, Female Adopting a healthy lifestyle and getting preventive care can go a long way to promote health and wellness. Talk with your health care provider about what schedule of regular examinations is right for you. This is a good chance for you to check in with your provider about disease prevention and staying healthy. In between checkups, there are plenty of things you can do on your own. Experts have done a lot of research about which lifestyle changes and preventive measures are most likely to keep you healthy. Ask your health care provider for more information. WEIGHT AND DIET  Eat a healthy diet  Be sure to include plenty of vegetables, fruits, low-fat dairy products, and lean protein.  Do not eat a lot of foods high in solid fats, added sugars, or salt.  Get regular exercise. This is one of the most important things you can do for your health.  Most adults should exercise for at least 150 minutes each week. The exercise should increase your heart rate and make you sweat (moderate-intensity exercise).  Most adults should also do strengthening exercises at least twice a week. This is in addition to the moderate-intensity exercise.  Maintain a healthy weight  Body mass index (BMI) is a measurement that can be used to identify possible weight problems. It estimates body fat based on height and weight. Your health care provider can help determine your BMI and help you achieve or maintain a healthy weight.  For females 20 years of age and older:   A BMI below 18.5 is considered underweight.  A BMI of 18.5 to 24.9 is normal.  A BMI of 25 to 29.9 is considered overweight.  A BMI of 30 and above is considered obese.  Watch levels of cholesterol and blood lipids  You should start having your blood tested for lipids and cholesterol at 30 years of age, then have this test every 5 years.  You may need to have your cholesterol levels checked more often if:  Your lipid  or cholesterol levels are high.  You are older than 30 years of age.  You are at high risk for heart disease.  CANCER SCREENING   Lung Cancer  Lung cancer screening is recommended for adults 55-80 years old who are at high risk for lung cancer because of a history of smoking.  A yearly low-dose CT scan of the lungs is recommended for people who:  Currently smoke.  Have quit within the past 15 years.  Have at least a 30-pack-year history of smoking. A pack year is smoking an average of one pack of cigarettes a day for 1 year.  Yearly screening should continue until it has been 15 years since you quit.  Yearly screening should stop if you develop a health problem that would prevent you from having lung cancer treatment.  Breast Cancer  Practice breast self-awareness. This means understanding how your breasts normally appear and feel.  It also means doing regular breast self-exams. Let your health care provider know about any changes, no matter how small.  If you are in your 20s or 30s, you should have a clinical breast exam (CBE) by a health care provider every 1-3 years as part of a regular health exam.  If you are 40 or older, have a CBE every year. Also consider having a breast X-ray (mammogram) every year.  If you have a family history of breast cancer, talk to your health care provider about genetic screening.  If you   are at high risk for breast cancer, talk to your health care provider about having an MRI and a mammogram every year.  Breast cancer gene (BRCA) assessment is recommended for women who have family members with BRCA-related cancers. BRCA-related cancers include:  Breast.  Ovarian.  Tubal.  Peritoneal cancers.  Results of the assessment will determine the need for genetic counseling and BRCA1 and BRCA2 testing. Cervical Cancer Your health care provider may recommend that you be screened regularly for cancer of the pelvic organs (ovaries, uterus, and  vagina). This screening involves a pelvic examination, including checking for microscopic changes to the surface of your cervix (Pap test). You may be encouraged to have this screening done every 3 years, beginning at age 21.  For women ages 30-65, health care providers may recommend pelvic exams and Pap testing every 3 years, or they may recommend the Pap and pelvic exam, combined with testing for human papilloma virus (HPV), every 5 years. Some types of HPV increase your risk of cervical cancer. Testing for HPV may also be done on women of any age with unclear Pap test results.  Other health care providers may not recommend any screening for nonpregnant women who are considered low risk for pelvic cancer and who do not have symptoms. Ask your health care provider if a screening pelvic exam is right for you.  If you have had past treatment for cervical cancer or a condition that could lead to cancer, you need Pap tests and screening for cancer for at least 20 years after your treatment. If Pap tests have been discontinued, your risk factors (such as having a new sexual partner) need to be reassessed to determine if screening should resume. Some women have medical problems that increase the chance of getting cervical cancer. In these cases, your health care provider may recommend more frequent screening and Pap tests. Colorectal Cancer  This type of cancer can be detected and often prevented.  Routine colorectal cancer screening usually begins at 30 years of age and continues through 30 years of age.  Your health care provider may recommend screening at an earlier age if you have risk factors for colon cancer.  Your health care provider may also recommend using home test kits to check for hidden blood in the stool.  A small camera at the end of a tube can be used to examine your colon directly (sigmoidoscopy or colonoscopy). This is done to check for the earliest forms of colorectal  cancer.  Routine screening usually begins at age 50.  Direct examination of the colon should be repeated every 5-10 years through 30 years of age. However, you may need to be screened more often if early forms of precancerous polyps or small growths are found. Skin Cancer  Check your skin from head to toe regularly.  Tell your health care provider about any new moles or changes in moles, especially if there is a change in a mole's shape or color.  Also tell your health care provider if you have a mole that is larger than the size of a pencil eraser.  Always use sunscreen. Apply sunscreen liberally and repeatedly throughout the day.  Protect yourself by wearing long sleeves, pants, a wide-brimmed hat, and sunglasses whenever you are outside. HEART DISEASE, DIABETES, AND HIGH BLOOD PRESSURE   High blood pressure causes heart disease and increases the risk of stroke. High blood pressure is more likely to develop in:  People who have blood pressure in the high end   of the normal range (130-139/85-89 mm Hg).  People who are overweight or obese.  People who are African American.  If you are 38-23 years of age, have your blood pressure checked every 3-5 years. If you are 61 years of age or older, have your blood pressure checked every year. You should have your blood pressure measured twice--once when you are at a hospital or clinic, and once when you are not at a hospital or clinic. Record the average of the two measurements. To check your blood pressure when you are not at a hospital or clinic, you can use:  An automated blood pressure machine at a pharmacy.  A home blood pressure monitor.  If you are between 45 years and 39 years old, ask your health care provider if you should take aspirin to prevent strokes.  Have regular diabetes screenings. This involves taking a blood sample to check your fasting blood sugar level.  If you are at a normal weight and have a low risk for diabetes,  have this test once every three years after 30 years of age.  If you are overweight and have a high risk for diabetes, consider being tested at a younger age or more often. PREVENTING INFECTION  Hepatitis B  If you have a higher risk for hepatitis B, you should be screened for this virus. You are considered at high risk for hepatitis B if:  You were born in a country where hepatitis B is common. Ask your health care provider which countries are considered high risk.  Your parents were born in a high-risk country, and you have not been immunized against hepatitis B (hepatitis B vaccine).  You have HIV or AIDS.  You use needles to inject street drugs.  You live with someone who has hepatitis B.  You have had sex with someone who has hepatitis B.  You get hemodialysis treatment.  You take certain medicines for conditions, including cancer, organ transplantation, and autoimmune conditions. Hepatitis C  Blood testing is recommended for:  Everyone born from [birth year] through 1965.  Anyone with known risk factors for hepatitis C. Sexually transmitted infections (STIs)  You should be screened for sexually transmitted infections (STIs) including gonorrhea and chlamydia if:  You are sexually active and are younger than 30 years of age.  You are older than 30 years of age and your health care provider tells you that you are at risk for this type of infection.  Your sexual activity has changed since you were last screened and you are at an increased risk for chlamydia or gonorrhea. Ask your health care provider if you are at risk.  If you do not have HIV, but are at risk, it may be recommended that you take a prescription medicine daily to prevent HIV infection. This is called pre-exposure prophylaxis (PrEP). You are considered at risk if:  You are sexually active and do not regularly use condoms or know the HIV status of your partner(s).  You take drugs by injection.  You are sexually  active with a partner who has HIV. Talk with your health care provider about whether you are at high risk of being infected with HIV. If you choose to begin PrEP, you should first be tested for HIV. You should then be tested every 3 months for as long as you are taking PrEP.  PREGNANCY   If you are premenopausal and you may become pregnant, ask your health care provider about preconception counseling.  If you may  become pregnant, take 400 to 800 micrograms (mcg) of folic acid every day.  If you want to prevent pregnancy, talk to your health care provider about birth control (contraception). OSTEOPOROSIS AND MENOPAUSE   Osteoporosis is a disease in which the bones lose minerals and strength with aging. This can result in serious bone fractures. Your risk for osteoporosis can be identified using a bone density scan.  If you are 38 years of age or older, or if you are at risk for osteoporosis and fractures, ask your health care provider if you should be screened.  Ask your health care provider whether you should take a calcium or vitamin D supplement to lower your risk for osteoporosis.  Menopause may have certain physical symptoms and risks.  Hormone replacement therapy may reduce some of these symptoms and risks. Talk to your health care provider about whether hormone replacement therapy is right for you.  HOME CARE INSTRUCTIONS   Schedule regular health, dental, and eye exams.  Stay current with your immunizations.   Do not use any tobacco products including cigarettes, chewing tobacco, or electronic cigarettes.  If you are pregnant, do not drink alcohol.  If you are breastfeeding, limit how much and how often you drink alcohol.  Limit alcohol intake to no more than 1 drink per day for nonpregnant women. One drink equals 12 ounces of beer, 5 ounces of wine, or 1 ounces of hard liquor.  Do not use street drugs.  Do not share needles.  Ask your health care provider for help if  you need support or information about quitting drugs.  Tell your health care provider if you often feel depressed.  Tell your health care provider if you have ever been abused or do not feel safe at home.   This information is not intended to replace advice given to you by your health care provider. Make sure you discuss any questions you have with your health care provider.   Document Released: 08/13/2010 Document Revised: 02/18/2014 Document Reviewed: 12/30/2012 Elsevier Interactive Patient Education [employer].

## 2015-05-30 NOTE — Telephone Encounter (Signed)
Spoke with patient reviewed lab results and instructions. Patient verbalized understanding. 

## 2015-07-03 ENCOUNTER — Other Ambulatory Visit: Payer: Self-pay | Admitting: *Deleted

## 2015-07-03 NOTE — Telephone Encounter (Signed)
Pharmacy request for [place] received. Patient last seen 05/30/15 for CPE. Last refill 11/03/14 by Dr Milinda CaveMcgowen for 30 tabs with 5 refills.

## 2015-07-04 MED ORDER — ZOLPIDEM TARTRATE ER 6.25 MG PO TBCR: 12.5000 mg | EXTENDED_RELEASE_TABLET | Freq: Every evening | ORAL | Status: DC | PRN

## 2015-07-25 ENCOUNTER — Ambulatory Visit (INDEPENDENT_AMBULATORY_CARE_PROVIDER_SITE_OTHER): Payer: BLUE CROSS/BLUE SHIELD | Admitting: Neurology

## 2015-07-25 ENCOUNTER — Encounter: Payer: Self-pay | Admitting: Neurology

## 2015-07-25 VITALS — BP 110/68 | HR 103 | Ht 65.0 in | Wt 117.0 lb

## 2015-07-25 DIAGNOSIS — G43009 Migraine without aura, not intractable, without status migrainosus: Secondary | ICD-10-CM | POA: Diagnosis not present

## 2015-07-25 MED ORDER — SUMATRIPTAN SUCCINATE 11 MG/NOSEPC NA EXHP: 1.0000 | INHALANT_POWDER | NASAL | Status: DC

## 2015-07-25 NOTE — Progress Notes (Signed)
NEUROLOGY FOLLOW UP OFFICE NOTE  Terri [name] [id number]  HISTORY OF PRESENT ILLNESS: Terri [name] is a 30 year old right-handed female with depression, IBS, migraines, asthma, depression with anxiety and history of closed head injury with ICH with subsequent symptomatic seizures who follows up for chronic migraine.  UPDATE: Unchanged but she forgot to start supplements. Intensity:  5-6/10, 10/10 severe Duration:  All day to 3 days Frequency:  13 headache days per month Current NSAIDS:  none Current analgesics:  Morphine (for hip pain) Current triptans:  Onzetra spray (effective but only used the sample) Current anti-nausea:  none Current muscle relaxants:  baclofen 10mg  twice daily as needed. Current Antihypertensive medications:  none Current Antidepressant medications:  none Current Anticonvulsant medications: none Current Vitamins/Herbal/Supplements: B12 Current Antihistamines/Decongestants:  none Other medication:  Ambien, Depo-Provera, clonazepam  Caffeine:  minimal Alcohol:  occasionally Smoker:  no Diet:  Reduced fast food and soda, increased water Exercise:  No frequent Depression/stress:  okay Sleep hygiene:  good  HISTORY: Onset:  2014, after discontinuing daily use of tramadol for hip injury Location:  Usually right sided, either front to back or just frontal Quality:  Usually sharp, sometimes throbbing Initial intensity:  5-6/10, 8-9/10 when severe Aura:  no Prodrome:  no Associated symptoms:  Initially had nausea.  No photophobia, phonophobia or visual disturbance Initial Duration:  6 hours severe headache followed by dull lingering headache for a couple of days Initial Frequency:  Headache 5 out of 7 days per week (severe 5 days per month) Triggers/exacerbating factors:  Hydrocodone, extreme cold, wine Relieving factors:  rest Activity:  Unable to function 5 days per month.  CT of head from 02/12/15 was unremarkable.  Past NSAIDS:  ibuprofen Past  analgesics:  tramadol 50mg , hydrocodone Past abortive triptans:  Maxalt (effective but caused extreme drowsiness) Past muscle relaxants:  cyclobenzaprine Past anti-nausea:  none Past antihypertensive medications:  Propranolol (asthma) Past antidepressant medications:  amitriptyline 75mg , citalopram Past anticonvulsant medications:  Depakene, Lyrica Past vitamins/Herbal/Supplements:  none Past antihistamines/decongestants:  none  Family history of headache:  No No prior history of migraines.  She sustained traumatic ICH as a toddler and was on antiepileptic medications for 1 year.  She has not had any recurrent seizures.  PAST MEDICAL HISTORY: Past Medical History  Diagnosis Date  . HSV infection     Genital clx pos HSV 2--2007.  . Drug overdose 2004    Xanax  . Depression with anxiety     Failed buspar.  Did well on citalopram.  . Brain injury (HCC) 30 yrs old    fell off babysitters cabinet, and on a separate occasion she fell down a flight of stairs  . Closed head injury About age 24 yrs    ICH per pt, with seizure disorder for appox 2 yrs afterward.  No significant residual disability.  . Seizure (HCC)     for 2 yrs after closed head injury/ICH  . Right hip pain 05/2009    MRI 10/12/09: Femoral head and neck stress injury plus small acetabular labral tear.  [facility] ortho (Dr. Caswell Corwin) as of 11/2012  . History of chlamydia   . H/O varicella   . Hx: UTI (urinary tract infection) 09/06/10  . IBS (irritable bowel syndrome)   . Allergy   . Hx of migraines     chronic migraines (Dr. Everlena Cooper) 03/2015    MEDICATIONS: Current Outpatient Prescriptions on File Prior to Visit  Medication Sig Dispense Refill  . albuterol (PROVENTIL HFA;VENTOLIN HFA)  108 (90 Base) MCG/ACT inhaler Inhale 2 puffs into the lungs every 6 (six) hours as needed for wheezing or shortness of breath. 1 Inhaler 0  . baclofen (LIORESAL) 10 MG tablet TAKE 1 TABLET BY MOUTH 2 TIMES DAILY AS NEEDED  2  . calcium carbonate  (OS-CAL) 600 MG TABS Take 600 mg by mouth daily. Reported on 05/30/2015    . clonazePAM (KLONOPIN) 0.5 MG tablet Take 0.5 mg by mouth 2 (two) times daily as needed for anxiety.    . MedroxyPROGESTERone Acetate (DEPO-PROVERA IM) Inject into the muscle every 3 (three) months.      . morphine (MS CONTIN) 30 MG 12 hr tablet   0  . morphine (MSIR) 15 MG tablet   0  . polyethylene glycol powder (GLYCOLAX/MIRALAX) powder 1 capful in 8 oz of water one to two times per day 850 g 3  . rizatriptan (MAXALT) 10 MG tablet May repeat in 2 hours if needed--max of  in 24h period. 10 tablet 2  . valACYclovir (VALTREX) 1000 MG tablet Take 1,000 mg by mouth daily.  11  . vitamin B-12 (CYANOCOBALAMIN) 100 MCG tablet Take 1 tablet (100 mcg total) by mouth daily. 60 tablet 0  . Vitamin D, Ergocalciferol, (DRISDOL) 50000 units CAPS capsule Take 1 capsule (50,000 Units total) by mouth every 7 (seven) days. 12 capsule 0  . zolpidem (AMBIEN CR) 6.25 MG CR tablet Take 2 tablets (12.5 mg total) by mouth at bedtime as needed for sleep. 30 tablet 3  . [DISCONTINUED] amitriptyline (ELAVIL) 25 MG tablet Take 25 mg daily for 7 days, then increase to 50 mg daily for 7 days, then may increase to 75 mg daily. 63 tablet 0   No current facility-administered medications on file prior to visit.    ALLERGIES: Allergies  Allergen Reactions  . Penicillins Rash    FAMILY HISTORY: Family History  Problem Relation Age of Onset  . Breast cancer Mother 23  . Thyroid cancer Mother   . Hepatitis Father     Hep C  . Alcohol abuse Father   . Thyroid disease Sister   . Anxiety disorder Sister   . Thyroid cancer Sister   . Thrombophlebitis Maternal Grandfather   . Anxiety disorder Maternal Grandfather   . Heart disease Maternal Grandfather   . Alzheimer's disease Paternal Grandmother     SOCIAL HISTORY: Social History   Social History  . Marital Status: Single    Spouse Name: N/A  . Number of Children: 1  . Years of  Education: N/A   Occupational History  . Not on file.   Social History Main Topics  . Smoking status: Former Smoker -- 0.30 packs/day    Types: Cigarettes    Quit date: 02/12/2007  . Smokeless tobacco: Never Used  . Alcohol Use: Yes     Comment: occasional  . Drug Use: No  . Sexual Activity:    Partners: Male    Birth Control/ Protection: Condom, Coitus interruptus   Other Topics Concern  . Not on file   Social History Narrative   Single.     Lives in [place] with her mom and dad and her 61 y/o son [name].   Works as a Mining engineer with mental/developmental disabilities.   Describes somewhat wild teenage years, "made some bad choices and hung out with the wrong people".  However, after that she had no issues with drugs or alcohol.   Reports that she had a second child a  couple of years after her son [name] (2008) and she gave him/her up for adoption right after birth.                     REVIEW OF SYSTEMS: Constitutional: No fevers, chills, or sweats, no generalized fatigue, change in appetite Eyes: No visual changes, double vision, eye pain Ear, nose and throat: No hearing loss, ear pain, nasal congestion, sore throat Cardiovascular: No chest pain, palpitations Respiratory:  No shortness of breath at rest or with exertion, wheezes GastrointestinaI: No nausea, vomiting, diarrhea, abdominal pain, fecal incontinence Genitourinary:  No dysuria, urinary retention or frequency Musculoskeletal:  No neck pain, back pain Integumentary: No rash, pruritus, skin lesions Neurological: as above Psychiatric: No depression, insomnia, anxiety Endocrine: No palpitations, fatigue, diaphoresis, mood swings, change in appetite, change in weight, increased thirst Hematologic/Lymphatic:  No purpura, petechiae. Allergic/Immunologic: no itchy/runny eyes, nasal congestion, recent allergic reactions, rashes  PHYSICAL EXAM: Filed Vitals:   07/25/15 0928  BP: 110/68  Pulse:  103   General: No acute distress.  Patient appears well-groomed.  normal body habitus. Head:  Normocephalic/atraumatic Eyes:  Fundi examined but not visualized Neck: supple, no paraspinal tenderness, full range of motion Heart:  Regular rate and rhythm Lungs:  Clear to auscultation bilaterally Back: No paraspinal tenderness Neurological Exam: alert and oriented to person, place, and time. Attention span and concentration intact, recent and remote memory intact, fund of knowledge intact.  Speech fluent and not dysarthric, language intact.  CN II-XII intact. Bulk and tone normal, muscle strength 5/5 throughout.  Sensation to light touch, temperature and vibration intact.  Deep tendon reflexes 2+ throughout, toes downgoing.  Finger to nose and heel to shin testing intact.  Gait normal, Romberg negative.  IMPRESSION: Migraine without aura  PLAN: 1.  She will try magnesium citrate, riboflavin and coenzyme Q10 2.  Will prescribe Suzan Nailernzetra Xsail for abortive therapy 3.  Will follow up in 6 weeks.  Otherwise, she may contact us in 6 weeks and follow up in 3 months. 4.  Lifestyle modification  15 minutes spent face to face with patient, over 50% spent discussing management.  Shon MilletAdam Mikell Kazlauskas, DO  CC:  Lorna Fewenne Kuneff, DO

## 2015-07-25 NOTE — Patient Instructions (Signed)
Migraine Recommendations: 1.  Start the following over the counter supplements for migraine prevention:  Magnesium citrate 400mg  daily, riboflavin 400mg  daily and coenzyme Q10 100mg  three times daily. 2.  At earliest onset of migraine headache, use the Onzetra Xsail intranasal device.  Use 1 cap in each nostril.  May repeat dose once after 2 hours.  Do not exceed 2 doses in 24 hours. 3.  Limit use of pain relievers to no more than 2 days out of the week.  These medications include acetaminophen, ibuprofen, triptans and narcotics.  This will help reduce risk of rebound headaches. 4.  Be aware of common food triggers such as processed sweets, processed foods with nitrites (such as deli meat, hot dogs, sausages), foods with MSG, alcohol (such as wine), chocolate, certain cheeses, certain fruits (dried fruits, some citrus fruit), vinegar, diet soda. 4.  Avoid caffeine 5.  Routine exercise 6.  Proper sleep hygiene 7.  Stay adequately hydrated with water 8.  Keep a headache diary. 9.  Maintain proper stress management. 10.  Do not skip meals. 11.  CONTACT ME IN 6 WEEKS WITH UPDATE AND WE CAN START A PREVENTATIVE IF FREQUENCY OF HEADACHES NOT IMPROVED.

## 2015-08-24 ENCOUNTER — Ambulatory Visit (INDEPENDENT_AMBULATORY_CARE_PROVIDER_SITE_OTHER): Payer: BLUE CROSS/BLUE SHIELD | Admitting: *Deleted

## 2015-08-24 ENCOUNTER — Other Ambulatory Visit: Payer: Self-pay | Admitting: Family Medicine

## 2015-08-24 ENCOUNTER — Other Ambulatory Visit: Payer: Self-pay | Admitting: *Deleted

## 2015-08-24 DIAGNOSIS — A6000 Herpesviral infection of urogenital system, unspecified: Secondary | ICD-10-CM | POA: Insufficient documentation

## 2015-08-24 DIAGNOSIS — Z309 Encounter for contraceptive management, unspecified: Secondary | ICD-10-CM | POA: Diagnosis not present

## 2015-08-24 MED ORDER — MEDROXYPROGESTERONE ACETATE 150 MG/ML IM SUSP
150.0000 mg | Freq: Once | INTRAMUSCULAR | Status: AC
  Administered 2015-08-24: 150 mg via INTRAMUSCULAR

## 2015-08-24 MED ORDER — VALACYCLOVIR HCL 1 G PO TABS: 1000.0000 mg | ORAL_TABLET | Freq: Every day | ORAL | Status: DC

## 2015-08-24 NOTE — Telephone Encounter (Signed)
Please call pt concerning her refill request on valtrex. Since it has been prescribed by her prior provider, I am uncertain if she is taking this medication daily or with active infection only. It appears it has been written both ways. Please advise and we will call in appropriate dose/amount.

## 2015-08-24 NOTE — Progress Notes (Signed)
Patient presents today for Depo Provera injection. Patient tolerated injection well. Next injection in 3 months (11/09/15-11/13/15).

## 2015-08-24 NOTE — Telephone Encounter (Signed)
Unable to reach patient confirmed with pharmacy recent Rx is for 1000mg  daily. Rx sent per Dr Claiborne BillingsKuneff

## 2015-08-24 NOTE — Telephone Encounter (Signed)
Received pharmacy request for refill on valacyclovir 1 gram tab. Patient last office visit 05/30/15 has an upcoming appt scheduled. Please advise

## 2015-08-25 ENCOUNTER — Encounter: Payer: Self-pay | Admitting: Family Medicine

## 2015-08-25 ENCOUNTER — Ambulatory Visit (INDEPENDENT_AMBULATORY_CARE_PROVIDER_SITE_OTHER): Payer: BLUE CROSS/BLUE SHIELD | Admitting: Family Medicine

## 2015-08-25 VITALS — BP 87/60 | HR 84 | Temp 98.4°F | Resp 20 | Wt 118.2 lb

## 2015-08-25 DIAGNOSIS — F411 Generalized anxiety disorder: Secondary | ICD-10-CM | POA: Diagnosis not present

## 2015-08-25 MED ORDER — CLONAZEPAM 0.5 MG PO TABS: 0.5000 mg | ORAL_TABLET | Freq: Two times a day (BID) | ORAL | Status: DC | PRN

## 2015-08-25 NOTE — Patient Instructions (Signed)
Refill on her medicine today with refills.  It was nice to see you again.

## 2015-08-25 NOTE — Progress Notes (Signed)
Patient ID: Terri [name], female   DOB: September 23, 1985, 30 y.o.   MRN: [id number]    Terri [name], Terri [name] Jun 02, 1985, 30 y.o., female MRN: [id number] Patient Care Team    Relationship Specialty Notifications Start End  Natalia Leatherwood, DO PCP - General Family Medicine  05/16/15   Ardell Isaacs, MD Consulting Physician Pain Medicine  03/17/13   Drema Dallas, DO Consulting Physician Neurology  03/24/15     CC: anxiety Subjective: Pt presents for an OV with complaints of anxiety. She has been prescribed klonopin 0.5 mg BID PRN by prior provider. She rarely needs to take on a daily basis.  She has been on controller medicine (cymbalata) in the past, but does not feel she needs that currently.  Pt feels symptoms are well controled.    Allergies  Allergen Reactions  . Penicillins Rash   Social History  Substance Use Topics  . Smoking status: Former Smoker -- 0.30 packs/day    Types: Cigarettes    Quit date: 02/12/2007  . Smokeless tobacco: Never Used  . Alcohol Use: Yes     Comment: occasional   Past Medical History  Diagnosis Date  . HSV infection     Genital clx pos HSV 2--2007.  . Drug overdose 2004    Xanax  . Depression with anxiety     Failed buspar.  Did well on citalopram.  . Brain injury (HCC) 30 yrs old    fell off babysitters cabinet, and on a separate occasion she fell down a flight of stairs  . Closed head injury About age 44 yrs    ICH per pt, with seizure disorder for appox 2 yrs afterward.  No significant residual disability.  . Seizure (HCC)     for 2 yrs after closed head injury/ICH  . Right hip pain 05/2009    MRI 10/12/09: Femoral head and neck stress injury plus small acetabular labral tear.  [facility] ortho (Dr. Caswell Corwin) as of 11/2012  . History of chlamydia   . H/O varicella   . Hx: UTI (urinary tract infection) 09/06/10  . IBS (irritable bowel syndrome)   . Allergy   . Hx of migraines     chronic migraines (Dr. Everlena Cooper) 03/2015   Past Surgical History  Procedure Laterality  Date  . Wisdom tooth extraction    . Mole removal      on back  . Hip surgery  10/10/2010    Right hip GT bursectomy and osteophytectomy for intractable right greater troch pain (Dr. Despina Hick).  [facility] second opinion 06/2012--resulted in repeat MRI, then right hip IA injection that gave mild short term response, then was referred to PT with iontophoresis (as of 09/07/12)--was told to f/u in 6 wks.   Family History  Problem Relation Age of Onset  . Breast cancer Mother 39  . Thyroid cancer Mother   . Hepatitis Father     Hep C  . Alcohol abuse Father   . Thyroid disease Sister   . Anxiety disorder Sister   . Thyroid cancer Sister   . Thrombophlebitis Maternal Grandfather   . Anxiety disorder Maternal Grandfather   . Heart disease Maternal Grandfather   . Alzheimer's disease Paternal Grandmother      Medication List       This list is accurate as of: 08/25/15  3:34 PM.  Always use your most recent med list.               albuterol 108 (90 Base)  MCG/ACT inhaler  Commonly known as:  PROVENTIL HFA;VENTOLIN HFA  Inhale 2 puffs into the lungs every 6 (six) hours as needed for wheezing or shortness of breath.     baclofen 10 MG tablet  Commonly known as:  LIORESAL  TAKE 1 TABLET BY MOUTH 2 TIMES DAILY AS NEEDED     calcium carbonate 600 MG Tabs tablet  Commonly known as:  OS-CAL  Take 600 mg by mouth daily. Reported on 05/30/2015     clonazePAM 0.5 MG tablet  Commonly known as:  KLONOPIN  Take 0.5 mg by mouth 2 (two) times daily as needed for anxiety.     DEPO-PROVERA IM  Inject into the muscle every 3 (three) months.     morphine 30 MG 12 hr tablet  Commonly known as:  MS CONTIN     morphine 15 MG tablet  Commonly known as:  MSIR     polyethylene glycol powder powder  Commonly known as:  GLYCOLAX/MIRALAX  1 capful in 8 oz of water one to two times per day     rizatriptan 10 MG tablet  Commonly known as:  MAXALT  May repeat in 2 hours if needed--max of 30mg  in 24h  period.     SUMAtriptan Succinate 11 MG/NOSEPC Exhp  Commonly known as:  ONZETRA XSAIL  Place 1 puff into the nose as directed. Place 1 cap in each nostril at earliest onset of headache.  May repeat dose x1 in 2 hrs if needed.  Do not exceed 2 doses in 24 hours     valACYclovir 1000 MG tablet  Commonly known as:  VALTREX  Take 1 tablet (1,000 mg total) by mouth daily.     vitamin B-12 100 MCG tablet  Commonly known as:  CYANOCOBALAMIN  Take 1 tablet (100 mcg total) by mouth daily.     Vitamin D (Ergocalciferol) 50000 units Caps capsule  Commonly known as:  DRISDOL  Take 1 capsule (50,000 Units total) by mouth every 7 (seven) days.     zolpidem 6.25 MG CR tablet  Commonly known as:  AMBIEN CR  Take 2 tablets (12.5 mg total) by mouth at bedtime as needed for sleep.        No results found for this or any previous visit (from the past 24 hour(s)). No results found.   ROS: Negative, with the exception of above mentioned in HPI  Objective:  BP 87/60 mmHg  Pulse 84  Temp(Src) 98.4 F (36.9 C)  Resp 20  Wt 118 lb 4 oz (53.638 kg)  SpO2 97% Body mass index is 19.68 kg/(m^2). Gen: Afebrile. No acute distress. Nontoxic in appearance, well developed, well nourished. Pleasant caucasian female.  HENT: AT. [place]. MMM, no oral lesions.  Eyes:Pupils Equal Round Reactive to light, Extraocular movements intact,  Conjunctiva without redness, discharge or icterus. Neuro: Normal gait. PERLA. EOMi. Alert. Oriented x3  Psych: Normal affect, dress and demeanor. Normal speech. Normal thought content and judgment.  Assessment/Plan: Terri [name] is a 30 y.o. female present for acute OV for  Generalized anxiety disorder - refills on klonopin today.  - Use sparingly. Pt infrequently uses.  - F/U PRN  electronically signed by:  Felix Pacinienee Brinklee Cisse, DO  [facility] - [place]

## 2015-10-10 ENCOUNTER — Ambulatory Visit (INDEPENDENT_AMBULATORY_CARE_PROVIDER_SITE_OTHER): Payer: BLUE CROSS/BLUE SHIELD | Admitting: Family Medicine

## 2015-10-10 ENCOUNTER — Encounter: Payer: Self-pay | Admitting: Family Medicine

## 2015-10-10 VITALS — BP 100/69 | HR 80 | Temp 98.3°F | Resp 20 | Wt 120.0 lb

## 2015-10-10 DIAGNOSIS — F410 Panic disorder [episodic paroxysmal anxiety] without agoraphobia: Secondary | ICD-10-CM | POA: Diagnosis not present

## 2015-10-10 DIAGNOSIS — Z111 Encounter for screening for respiratory tuberculosis: Secondary | ICD-10-CM

## 2015-10-10 DIAGNOSIS — F1190 Opioid use, unspecified, uncomplicated: Secondary | ICD-10-CM | POA: Diagnosis not present

## 2015-10-10 DIAGNOSIS — M791 Myalgia, unspecified site: Secondary | ICD-10-CM

## 2015-10-10 MED ORDER — METHYLPREDNISOLONE ACETATE 40 MG/ML IJ SUSP
20.0000 mg | Freq: Once | INTRAMUSCULAR | Status: AC
  Administered 2015-10-10: 20 mg via INTRALESIONAL

## 2015-10-10 NOTE — Progress Notes (Signed)
Terri [name] , Oct 14, 1985, 30 y.o., female MRN: [id number] Patient Care Team    Relationship Specialty Notifications Start End  Natalia Leatherwood, DO PCP - General Family Medicine  05/16/15   Ardell Isaacs, MD Consulting Physician Pain Medicine  03/17/13   Drema Dallas, DO Consulting Physician Neurology  03/24/15     CC: back pain Subjective: Pt presents for an acute OV with complaints of back pain at trigger point and radiation of different pain from chest to back. She has not felt a pain like that in the past. It did occur after a coughing fit when she choked on some liquid   Associated symptoms include pain radiates to chest to thoracic area, lasted a few hours, completely resolved now. The pain was worse with movement and laying flat on her back made it worse. She though maybe it was heartburn, so she took pepto bismol and it did not help. She states her trigger point pain has been chronic and she has had it injected a few times in the last 2 years. Last was 04/2014. She would be interested in having injection again today.  Pt is prescribed MSIR  and baclofen by pain clinic for chronic pain. She feels this pain is different and the radiation of pain is concerning to her. She also has a h/o anxiety and prescribed klonopin.   PPD: pt is in need of TB screening for employment.   Allergies  Allergen Reactions  . Penicillins Rash   Social History  Substance Use Topics  . Smoking status: Former Smoker    Packs/day: 0.30    Types: Cigarettes    Quit date: 02/12/2007  . Smokeless tobacco: Never Used  . Alcohol use Yes     Comment: occasional   Past Medical History:  Diagnosis Date  . Allergy   . Brain injury [facility]) 30 yrs old   fell off babysitters cabinet, and on a separate occasion she fell down a flight of stairs  . Closed head injury About age 80 yrs   ICH per pt, with seizure disorder for appox 2 yrs afterward.  No significant residual disability.  . Depression with anxiety    Failed  buspar.  Did well on citalopram.  . Drug overdose 2004   Xanax  . H/O varicella   . History of chlamydia   . HSV infection    Genital clx pos HSV 2--2007.  Marland Kitchen Hx of migraines    chronic migraines (Dr. Everlena Cooper) 03/2015  . Hx: UTI (urinary tract infection) 09/06/10  . IBS (irritable bowel syndrome)   . Right hip pain 05/2009   MRI 10/12/09: Femoral head and neck stress injury plus small acetabular labral tear.  [facility] ortho (Dr. Caswell Corwin) as of 11/2012  . Seizure [facility])    for 2 yrs after closed head injury/ICH   Past Surgical History:  Procedure Laterality Date  . HIP SURGERY  10/10/2010   Right hip GT bursectomy and osteophytectomy for intractable right greater troch pain (Dr. Despina Hick).  [facility] second opinion 06/2012--resulted in repeat MRI, then right hip IA injection that gave mild short term response, then was referred to PT with iontophoresis (as of 09/07/12)--was told to f/u in 6 wks.  Marland Kitchen MOLE REMOVAL     on back  . WISDOM TOOTH EXTRACTION     Family History  Problem Relation Age of Onset  . Breast cancer Mother 10  . Thyroid cancer Mother   . Hepatitis Father  Hep C  . Alcohol abuse Father   . Thyroid disease Sister   . Anxiety disorder Sister   . Thyroid cancer Sister   . Thrombophlebitis Maternal Grandfather   . Anxiety disorder Maternal Grandfather   . Heart disease Maternal Grandfather   . Alzheimer's disease Paternal Grandmother      Medication List       Accurate as of 10/10/15  1:49 PM. Always use your most recent med list.          albuterol 108 (90 Base) MCG/ACT inhaler Commonly known as:  PROVENTIL HFA;VENTOLIN HFA Inhale 2 puffs into the lungs every 6 (six) hours as needed for wheezing or shortness of breath.   baclofen 10 MG tablet Commonly known as:  LIORESAL TAKE 1 TABLET BY MOUTH 2 TIMES DAILY AS NEEDED   calcium carbonate 600 MG Tabs tablet Commonly known as:  OS-CAL Take 600 mg by mouth daily. Reported on 05/30/2015   clonazePAM 0.5 MG tablet Commonly  known as:  KLONOPIN Take 1 tablet (0.5 mg total) by mouth 2 (two) times daily as needed for anxiety.   DEPO-PROVERA IM Inject into the muscle every 3 (three) months.   morphine 15 MG tablet Commonly known as:  MSIR Take 15 mg by mouth 2 (two) times daily.   polyethylene glycol powder powder Commonly known as:  GLYCOLAX/MIRALAX 1 capful in 8 oz of water one to two times per day   rizatriptan 10 MG tablet Commonly known as:  MAXALT May repeat in 2 hours if needed--max of 30mg  in 24h period.   valACYclovir 1000 MG tablet Commonly known as:  VALTREX Take 1 tablet (1,000 mg total) by mouth daily.   vitamin B-12 100 MCG tablet Commonly known as:  CYANOCOBALAMIN Take 1 tablet (100 mcg total) by mouth daily.   Vitamin D3 1000 units Caps Take by mouth.   zolpidem 6.25 MG CR tablet Commonly known as:  AMBIEN CR Take 2 tablets (12.5 mg total) by mouth at bedtime as needed for sleep.       No results found for this or any previous visit (from the past 24 hour(s)). No results found.   ROS: Negative, with the exception of above mentioned in HPI   Objective:  BP 100/69 (BP Location: Left Arm, Patient Position: Sitting, Cuff Size: Normal)   Pulse 80   Temp 98.3 F (36.8 C) (Oral)   Resp 20   Wt 120 lb (54.4 kg)   SpO2 98%   BMI 19.97 kg/m  Body mass index is 19.97 kg/m. Gen: Afebrile. No acute distress. Nontoxic in appearance, well developed, well nourished. Very pleasant female.  HENT: AT. [place]. MMM Eyes:Pupils Equal Round Reactive to light, Extraocular movements intact,  Conjunctiva without redness, discharge or icterus. CV: RRR  Chest: CTAB, no wheeze or crackles.  MSK: no erythema/bruising chest wall or thoracic spine, No bone tenderness, No tenderness chest wall or ribs. TTP right inferior scapular tender point.  Skin: no rashes, purpura or petechiae.  Neuro:  Normal gait. PERLA. EOMi. Alert. Oriented x3  Psych: Normal affect, dress and demeanor. Normal speech.  Normal thought content and judgment.  Assessment/Plan: Terri [name] is a 30 y.o. female present for acute OV for  Screening for tuberculosis - PPD placed today. Pt to return in 48-72 hours for nurse read.  Chronic narcotic use/Panic disorder/back pain: - Continue baclofen and MSIR for chronic pain.  - Acute pain was likely from rib head dysfunction after harsh cough by history. No  pain currently. Educated pt on rib head dysfunction and baclofen/stretches use if happens again.   Trigger point of right side of body - trigger point injection right inferior subscapular region. 05 cc depo medrol (40 mg) and 0.5 cc 1% lidocaine. Pt tolerated well.  - methylPREDNISolone acetate (DEPO-MEDROL) injection 20 mg; 0.5 mLs (20 mg total) by Intra-Lesional route once.   electronically signed by:  Felix Pacini, DO  [facility] - [place]

## 2015-10-10 NOTE — Patient Instructions (Signed)
Trigger point injection completed today to help release that area.  May see some mild bruising in that area.  The pain you experienced was likely from coughing and sounds like a rib head dysfunction.  If occurs again, muscle relaxer will help.    Trigger Point Injection Trigger points are areas where you have muscle pain. A trigger point injection is a shot given in the trigger point to relieve that pain. A trigger point might feel like a knot in your muscle. It hurts to press on a trigger point. Sometimes the pain spreads out (radiates) to other parts of the body. For example, pressing on a trigger point in your shoulder might cause pain in your arm or neck. You might have one trigger point. Or, you might have more than one. People often have trigger points in their upper back and lower back. They also occur often in the neck and shoulders. Pain from a trigger point lasts for a long time. It can make it hard to keep moving. You might not be able to do the exercise or physical therapy that could help you deal with the pain. A trigger point injection may help. It does not work for everyone. But, it may relieve your pain for a few days or a few months. A trigger point injection does not cure long-lasting (chronic) pain. LET YOUR CAREGIVER KNOW ABOUT:  Any allergies (especially to latex, lidocaine, or steroids).  Blood-thinning medicines that you take. These drugs can lead to bleeding or bruising after an injection. They include:  Aspirin.  Ibuprofen.  Clopidogrel.  Warfarin.  Other medicines you take. This includes all vitamins, herbs, eyedrops, over-the-counter medicines, and creams.  Use of steroids.  Recent infections.  Past problems with numbing medicines.  Bleeding problems.  Surgeries you have had.  Other health problems. RISKS AND COMPLICATIONS A trigger point injection is a safe treatment. However, problems may develop, such as:  Minor side effects usually go away in 1 to  2 days. These may include:  Soreness.  Bruising.  Stiffness.  More serious problems are rare. But, they may include:  Bleeding under the skin (hematoma).  Skin infection.  Breaking off of the needle under your skin.  Lung puncture.  The trigger point injection may not work for you. BEFORE THE PROCEDURE You may need to stop taking any medicine that thins your blood. This is to prevent bleeding and bruising. Usually these medicines are stopped several days before the injection. No other preparation is needed. PROCEDURE  A trigger point injection can be given in your caregiver's office or in a clinic. Each injection takes 2 minutes or less.  Your caregiver will feel for trigger points. The caregiver may use a marker to circle the area for the injection.  The skin over the trigger point will be washed with a germ-killing (antiseptic) solution.  The caregiver pinches the spot for the injection.  Then, a very thin needle is used for the shot. You may feel pain or a twitching feeling when the needle enters the trigger point.  A numbing solution may be injected into the trigger point. Sometimes a drug to keep down swelling, redness, and warmth (inflammation) is also injected.  Your caregiver moves the needle around the trigger zone until the tightness and twitching goes away.  After the injection, your caregiver may put gentle pressure over the injection site.  Then it is covered with a bandage. AFTER THE PROCEDURE  You can go right home after the injection.  The bandage can be taken off after a few hours.  You may feel sore and stiff for 1 to 2 days.  Go back to your regular activities slowly. Your caregiver may ask you to stretch your muscles. Do not do anything that takes extra energy for a few days.  Follow your caregiver's instructions to manage and treat other pain.   This information is not intended to replace advice given to you by your health care provider. Make  sure you discuss any questions you have with your health care provider.   Document Released: 01/17/2011 Document Revised: 05/25/2012 Document Reviewed: 01/17/2011 Elsevier Interactive Patient Education [employer]

## 2015-10-12 ENCOUNTER — Encounter: Payer: Self-pay | Admitting: Family Medicine

## 2015-10-12 LAB — TB SKIN TEST
Induration: 0 mm
TB Skin Test: NEGATIVE

## 2015-11-03 ENCOUNTER — Ambulatory Visit: Payer: BLUE CROSS/BLUE SHIELD | Admitting: Neurology

## 2015-11-09 ENCOUNTER — Ambulatory Visit (INDEPENDENT_AMBULATORY_CARE_PROVIDER_SITE_OTHER): Payer: BLUE CROSS/BLUE SHIELD | Admitting: *Deleted

## 2015-11-09 DIAGNOSIS — Z309 Encounter for contraceptive management, unspecified: Secondary | ICD-10-CM | POA: Diagnosis not present

## 2015-11-09 MED ORDER — MEDROXYPROGESTERONE ACETATE 150 MG/ML IM SUSP
150.0000 mg | Freq: Once | INTRAMUSCULAR | Status: AC
  Administered 2015-11-09: 150 mg via INTRAMUSCULAR

## 2015-11-09 NOTE — Progress Notes (Signed)
Patient presents today for Depo Provera injection. Patient tolerated procedure well.

## 2015-12-06 ENCOUNTER — Encounter: Payer: Self-pay | Admitting: Family Medicine

## 2015-12-12 ENCOUNTER — Other Ambulatory Visit: Payer: Self-pay | Admitting: *Deleted

## 2015-12-12 NOTE — Telephone Encounter (Signed)
Patient request refill on ambien last seen 09/04/15 last refill 07/04/15 30 tabs with 3 refills.

## 2015-12-13 MED ORDER — ZOLPIDEM TARTRATE ER 6.25 MG PO TBCR: 12.5000 mg | EXTENDED_RELEASE_TABLET | Freq: Every evening | ORAL | 3 refills | Status: DC | PRN

## 2015-12-19 ENCOUNTER — Telehealth: Payer: Self-pay | Admitting: *Deleted

## 2015-12-19 NOTE — Telephone Encounter (Signed)
Received a request for Maxalt refill. Patient currently seeing Neuro for headaches and last note indicates they have Rx'd something different. Please advise.

## 2015-12-20 MED ORDER — RIZATRIPTAN BENZOATE 10 MG PO TABS: ORAL_TABLET | ORAL | 2 refills | Status: DC

## 2015-12-20 NOTE — Telephone Encounter (Signed)
Refills on maxalt completed.

## 2016-01-25 ENCOUNTER — Ambulatory Visit (INDEPENDENT_AMBULATORY_CARE_PROVIDER_SITE_OTHER): Payer: BLUE CROSS/BLUE SHIELD | Admitting: *Deleted

## 2016-01-25 DIAGNOSIS — Z308 Encounter for other contraceptive management: Secondary | ICD-10-CM

## 2016-01-25 MED ORDER — MEDROXYPROGESTERONE ACETATE 150 MG/ML IM SUSP
150.0000 mg | Freq: Once | INTRAMUSCULAR | Status: AC
  Administered 2016-01-25: 150 mg via INTRAMUSCULAR

## 2016-01-25 NOTE — Progress Notes (Signed)
Patient presents today for Depo provera injection . Patient tolerated injection well.

## 2016-04-12 ENCOUNTER — Other Ambulatory Visit: Payer: Self-pay | Admitting: *Deleted

## 2016-04-12 MED ORDER — VALACYCLOVIR HCL 1 G PO TABS: 1000.0000 mg | ORAL_TABLET | Freq: Every day | ORAL | 5 refills | Status: DC

## 2016-04-22 ENCOUNTER — Ambulatory Visit: Payer: BLUE CROSS/BLUE SHIELD

## 2016-04-25 ENCOUNTER — Telehealth: Payer: Self-pay | Admitting: Family Medicine

## 2016-04-25 NOTE — Telephone Encounter (Signed)
Noted  

## 2016-04-25 NOTE — Telephone Encounter (Signed)
Patient called to reschedule depo shot that she missed on Monday. Patient is rescheduled for 3/16 at 8am. Patient was not sure if she needed to speak to nurse about rescheduling.

## 2016-04-26 ENCOUNTER — Ambulatory Visit (INDEPENDENT_AMBULATORY_CARE_PROVIDER_SITE_OTHER): Payer: BLUE CROSS/BLUE SHIELD

## 2016-04-26 DIAGNOSIS — Z309 Encounter for contraceptive management, unspecified: Secondary | ICD-10-CM | POA: Diagnosis not present

## 2016-04-26 MED ORDER — MEDROXYPROGESTERONE ACETATE 150 MG/ML IM SUSP
150.0000 mg | Freq: Once | INTRAMUSCULAR | Status: AC
  Administered 2016-04-26: 150 mg via INTRAMUSCULAR

## 2016-04-26 NOTE — Progress Notes (Signed)
Patient presents today for Depo-Provera injection. Given with no incidence or complaints.  Patient notified to return between June 1 to June 15 for next injection.

## 2016-07-15 ENCOUNTER — Ambulatory Visit (INDEPENDENT_AMBULATORY_CARE_PROVIDER_SITE_OTHER): Payer: BLUE CROSS/BLUE SHIELD | Admitting: Family Medicine

## 2016-07-15 DIAGNOSIS — Z308 Encounter for other contraceptive management: Secondary | ICD-10-CM

## 2016-07-15 MED ORDER — MEDROXYPROGESTERONE ACETATE 150 MG/ML IM SUSP
150.0000 mg | Freq: Once | INTRAMUSCULAR | Status: AC
  Administered 2016-07-15: 150 mg via INTRAMUSCULAR

## 2016-07-15 NOTE — Progress Notes (Signed)
Medical screening examination/treatment/procedure(s) were performed by non-physician practitioner and as supervising physician I was immediately available for consultation/collaboration.  I agree with above assessment and plan.  Electronically Signed by: Renee Kuneff, DO [facility]- [place]   

## 2016-07-25 ENCOUNTER — Ambulatory Visit (INDEPENDENT_AMBULATORY_CARE_PROVIDER_SITE_OTHER): Payer: BLUE CROSS/BLUE SHIELD | Admitting: Family Medicine

## 2016-07-25 ENCOUNTER — Encounter: Payer: Self-pay | Admitting: Family Medicine

## 2016-07-25 VITALS — BP 90/60 | HR 89 | Temp 98.3°F | Resp 20 | Ht 65.0 in | Wt 118.8 lb

## 2016-07-25 DIAGNOSIS — G8929 Other chronic pain: Secondary | ICD-10-CM | POA: Diagnosis not present

## 2016-07-25 DIAGNOSIS — Z3009 Encounter for other general counseling and advice on contraception: Secondary | ICD-10-CM | POA: Diagnosis not present

## 2016-07-25 NOTE — Progress Notes (Signed)
Terri [name] , 30-Mar-1985, 31 y.o., female MRN: [id number] Patient Care Team    Relationship Specialty Notifications Start End  Natalia Leatherwood, DO PCP - General Family Medicine  05/16/15   Ardell Isaacs, MD Consulting Physician Pain Medicine  03/17/13   Drema Dallas, DO Consulting Physician Neurology  03/24/15     Chief Complaint  Patient presents with  . Contraception    possibly go off Depo Provera     Subjective: Pt presents for an OV for counseling on discontinuing depo provera injections for birth control. Patient is uncertain if she wants a birth control method, but does not look forward to the idea of having menstrual cycles again because she had painful heavy cycles. She has questions about the different type of birth control methods available. She reports she has not been sexually active in about 6 years and is not even close to be in a relationship with anyone now.   Depression screen [facility] 2/9 05/30/2015  Decreased Interest 0  Down, Depressed, Hopeless 0  PHQ - 2 Score 0    Allergies  Allergen Reactions  . Penicillins Rash   Social History  Substance Use Topics  . Smoking status: Former Smoker    Packs/day: 0.30    Types: Cigarettes    Quit date: 02/12/2007  . Smokeless tobacco: Never Used  . Alcohol use Yes     Comment: occasional   Past Medical History:  Diagnosis Date  . Allergy   . Brain injury [facility]) 31 yrs old   fell off babysitters cabinet, and on a separate occasion she fell down a flight of stairs  . Closed head injury About age 57 yrs   ICH per pt, with seizure disorder for appox 2 yrs afterward.  No significant residual disability.  . Depression with anxiety    Failed buspar.  Did well on citalopram.  . Drug overdose 2004   Xanax  . H/O varicella   . History of chlamydia   . HSV infection    Genital clx pos HSV 2--2007.  Marland Kitchen Hx of migraines    chronic migraines (Dr. Everlena Cooper) 03/2015  . Hx: UTI (urinary tract infection) 09/06/10  . IBS (irritable  bowel syndrome)   . Right hip pain 05/2009   MRI 10/12/09: Femoral head and neck stress injury plus small acetabular labral tear.  [facility] ortho (Dr. Caswell Corwin) as of 11/2012.  Preferred pain mgmt: steroid injection into right subtrochanteric bursa 12/06/15.  . Seizure [facility])    for 2 yrs after closed head injury/ICH   Past Surgical History:  Procedure Laterality Date  . HIP SURGERY  10/10/2010   Right hip GT bursectomy and osteophytectomy for intractable right greater troch pain (Dr. Despina Hick).  [facility] second opinion 06/2012--resulted in repeat MRI, then right hip IA injection that gave mild short term response, then was referred to PT with iontophoresis (as of 09/07/12)--was told to f/u in 6 wks.  Marland Kitchen MOLE REMOVAL     on back  . WISDOM TOOTH EXTRACTION     Family History  Problem Relation Age of Onset  . Breast cancer Mother 51  . Thyroid cancer Mother   . Hepatitis Father        Hep C  . Alcohol abuse Father   . Thyroid disease Sister   . Anxiety disorder Sister   . Thyroid cancer Sister   . Thrombophlebitis Maternal Grandfather   . Anxiety disorder Maternal Grandfather   . Heart disease Maternal Grandfather   .  Alzheimer's disease Paternal Grandmother    Allergies as of 07/25/2016      Reactions   Penicillins Rash      Medication List       Accurate as of 07/25/16  8:35 AM. Always use your most recent med list.          albuterol 108 (90 Base) MCG/ACT inhaler Commonly known as:  PROVENTIL HFA;VENTOLIN HFA Inhale 2 puffs into the lungs every 6 (six) hours as needed for wheezing or shortness of breath.   baclofen 10 MG tablet Commonly known as:  LIORESAL TAKE 1 TABLET BY MOUTH 2 TIMES DAILY AS NEEDED   calcium carbonate 600 MG Tabs tablet Commonly known as:  OS-CAL Take 600 mg by mouth daily. Reported on 05/30/2015   clonazePAM 0.5 MG tablet Commonly known as:  KLONOPIN Take 1 tablet (0.5 mg total) by mouth 2 (two) times daily as needed for anxiety.   DEPO-PROVERA IM Inject  into the muscle every 3 (three) months.   rizatriptan 10 MG tablet Commonly known as:  MAXALT May repeat in 2 hours if needed--max of 30mg  in 24h period.   valACYclovir 1000 MG tablet Commonly known as:  VALTREX Take 1 tablet (1,000 mg total) by mouth daily.   vitamin B-12 100 MCG tablet Commonly known as:  CYANOCOBALAMIN Take 1 tablet (100 mcg total) by mouth daily.   Vitamin D3 1000 units Caps Take by mouth.   zolpidem 6.25 MG CR tablet Commonly known as:  AMBIEN CR Take 2 tablets (12.5 mg total) by mouth at bedtime as needed for sleep.       All past medical history, surgical history, allergies, family history, immunizations andmedications were updated in the EMR today and reviewed under the history and medication portions of their EMR.     ROS: Negative, with the exception of above mentioned in HPI   Objective:  BP 90/60 (BP Location: Right Arm, Patient Position: Sitting, Cuff Size: Normal)   Pulse 89   Temp 98.3 F (36.8 C)   Resp 20   Ht 5\' 5"  (1.651 m)   Wt 118 lb 12 oz (53.9 kg)   SpO2 99%   BMI 19.76 kg/m  Body mass index is 19.76 kg/m. Gen: Afebrile. No acute distress. Nontoxic in appearance, well developed, well nourished.  HENT: AT. [place].  MMM, no oral lesions.  Eyes:Pupils Equal Round Reactive to light, Extraocular movements intact,  Conjunctiva without redness, discharge or icterus. Neuro: Normal gait. . Alert. Oriented x3  Psych: Normal affect, dress and demeanor. Normal speech. Normal thought content and judgment.  No exam data present No results found. No results found for this or any previous visit (from the past 24 hour(s)).  Assessment/Plan: Terri [name] is a 31 y.o. female present for OV for  Birth control counseling - discussed multiple types of birth control available to her. Mirena may be a good fit for her, if she desires medication to help with cycles.  - Discussed return of menstrual cycles and abnormal bleeding pattern may occur  after DC depo provera.   - if desiring to start new BC, she would schedule (with GYN if desiring implant) the week she would would have been for her injection or after. Depo provera remains in the body about 15 weeks.  - If cycle does not return with in 3 months after depomedrol would have been due (if she does not start another form of of BC) then she was advised to be seen.  Other chronic pain Pt has been seen at a pain clinic for her chronic pain/hip pain. She has been on MS long acting, but does not desire that much medication any longer or daily medication.  - she is asking if management could be completed here. We briefly discussed options, of smaller amounts of meds (vicodin or percocet), pain contract, every 3 months follow up for pain management only. She will think about it and make an appt if she desires.    Reviewed expectations re: course of current medical issues.  Discussed self-management of symptoms.  Outlined signs and symptoms indicating need for more acute intervention.  Patient verbalized understanding and all questions were answered.  Patient received an After-Visit Summary.     Note is dictated utilizing voice recognition software. Although note has been proof read prior to signing, occasional typographical errors still can be missed. If any questions arise, please do not hesitate to call for verification.   electronically signed by:  Felix Pacini, DO  [facility] - [place]

## 2016-07-25 NOTE — Patient Instructions (Signed)
Levonorgestrel intrauterine device (IUD) What is this medicine? LEVONORGESTREL IUD (LEE voe nor jes trel) is a contraceptive (birth control) device. The device is placed inside the uterus by a healthcare professional. It is used to prevent pregnancy. This device can also be used to treat heavy bleeding that occurs during your period. This medicine may be used for other purposes; ask your health care provider or pharmacist if you have questions. COMMON BRAND NAME(S): Cameron AliKyleena, LILETTA, Mirena, Skyla What should I tell my health care provider before I take this medicine? They need to know if you have any of these conditions: -abnormal Pap smear -cancer of the breast, uterus, or cervix -diabetes -endometritis -genital or pelvic infection now or in the past -have more than one sexual partner or your partner has more than one partner -heart disease -history of an ectopic or tubal pregnancy -immune system problems -IUD in place -liver disease or tumor -problems with blood clots or take blood-thinners -seizures -use intravenous drugs -uterus of unusual shape -vaginal bleeding that has not been explained -an unusual or allergic reaction to levonorgestrel, other hormones, silicone, or polyethylene, medicines, foods, dyes, or preservatives -pregnant or trying to get pregnant -breast-feeding How should I use this medicine? This device is placed inside the uterus by a health care professional. Talk to your pediatrician regarding the use of this medicine in children. Special care may be needed. Overdosage: If you think you have taken too much of this medicine contact a poison control center or emergency room at once. NOTE: This medicine is only for you. Do not share this medicine with others. What should I watch for while using this medicine? Visit your doctor or health care professional for regular check ups. See your doctor if you or your partner has sexual contact with others, becomes HIV  positive, or gets a sexual transmitted disease. This product does not protect you against HIV infection (AIDS) or other sexually transmitted diseases. You can check the placement of the IUD yourself by reaching up to the top of your vagina with clean fingers to feel the threads. Do not pull on the threads. It is a good habit to check placement after each menstrual period. Call your doctor right away if you feel more of the IUD than just the threads or if you cannot feel the threads at all. The IUD may come out by itself. You may become pregnant if the device comes out. If you notice that the IUD has come out use a backup birth control method like condoms and call your health care provider. Using tampons will not change the position of the IUD and are okay to use during your period. This IUD can be safely scanned with magnetic resonance imaging (MRI) only under specific conditions. Before you have an MRI, tell your healthcare provider that you have an IUD in place, and which type of IUD you have in place.

## 2016-09-18 ENCOUNTER — Ambulatory Visit (INDEPENDENT_AMBULATORY_CARE_PROVIDER_SITE_OTHER): Payer: BLUE CROSS/BLUE SHIELD | Admitting: Family Medicine

## 2016-09-18 ENCOUNTER — Encounter: Payer: Self-pay | Admitting: Family Medicine

## 2016-09-18 VITALS — BP 106/72 | HR 87 | Temp 98.2°F | Resp 20 | Ht 65.0 in | Wt 121.0 lb

## 2016-09-18 DIAGNOSIS — G479 Sleep disorder, unspecified: Secondary | ICD-10-CM | POA: Insufficient documentation

## 2016-09-18 DIAGNOSIS — F418 Other specified anxiety disorders: Secondary | ICD-10-CM

## 2016-09-18 DIAGNOSIS — F410 Panic disorder [episodic paroxysmal anxiety] without agoraphobia: Secondary | ICD-10-CM

## 2016-09-18 DIAGNOSIS — F411 Generalized anxiety disorder: Secondary | ICD-10-CM

## 2016-09-18 HISTORY — DX: Sleep disorder, unspecified: G47.9

## 2016-09-18 MED ORDER — CLONAZEPAM 0.5 MG PO TABS: 0.5000 mg | ORAL_TABLET | Freq: Two times a day (BID) | ORAL | 5 refills | Status: DC | PRN

## 2016-09-18 MED ORDER — VENLAFAXINE HCL ER 37.5 MG PO CP24: 37.5000 mg | ORAL_CAPSULE | Freq: Every day | ORAL | 0 refills | Status: DC

## 2016-09-18 MED ORDER — VENLAFAXINE HCL ER 75 MG PO CP24: 75.0000 mg | ORAL_CAPSULE | Freq: Every day | ORAL | 0 refills | Status: DC

## 2016-09-18 MED ORDER — ZOLPIDEM TARTRATE ER 6.25 MG PO TBCR: 12.5000 mg | EXTENDED_RELEASE_TABLET | Freq: Every evening | ORAL | 5 refills | Status: DC | PRN

## 2016-09-18 NOTE — Patient Instructions (Signed)
Hang in there. You are a strong women. I will place the referral for you we discussed.  [place].  Effexor 37.5 for 7 days, then 75 mg  Day, follow up in 4 weeks.    Depressive Disorder, Adult Major depressive disorder (MDD) is a mental health condition. MDD often makes you feel sad, hopeless, or helpless. MDD can also cause symptoms in your body. MDD can affect your:  Work.  School.  Relationships.  Other normal activities.  MDD can range from mild to very bad. It may occur once (single episode MDD). It can also occur many times (recurrent MDD). The main symptoms of MDD often include:  Feeling sad, depressed, or irritable most of the time.  Loss of interest.  MDD symptoms also include:  Sleeping too much or too little.  Eating too much or too little.  A change in your weight.  Feeling tired (fatigue) or having low energy.  Feeling worthless.  Feeling guilty.  Trouble making decisions.  Trouble thinking clearly.  Thoughts of suicide or harming others.  Feeling weak.  Feeling agitated.  Keeping yourself from being around other people (isolation).  Follow these instructions at home: Activity  Do these things as told by your doctor: ? Go back to your normal activities. ? Exercise regularly. ? Spend time outdoors. Alcohol  Talk with your doctor about how alcohol can affect your antidepressant medicines.  Do not drink alcohol. Or, limit how much alcohol you drink. ? This means no more than 1 drink a day for nonpregnant women and 2 drinks a day for men. One drink equals one of these:  12 oz of beer.  5 oz of wine.  1 oz of hard liquor. General instructions  Take over-the-counter and prescription medicines only as told by your doctor.  Eat a healthy diet.  Get plenty of sleep.  Find activities that you enjoy. Make time to do them.  Think about joining a support group. Your doctor may be able to suggest a group for you.  Keep  all follow-up visits as told by your doctor. This is important. Where to find more information:  The [employer] on Mental Illness: ? www.nami.org  U.[employer] of Mental Health: ? http://www.maynard.net/www.nimh.nih.gov  National Suicide Prevention Lifeline: ? 571 401 86101-814-271-3546. This is free, 24-hour help. Contact a doctor if:  Your symptoms get worse.  You have new symptoms. Get help right away if:  You self-harm.  You see, hear, taste, smell, or feel things that are not present (hallucinate). If you ever feel like you may hurt yourself or others, or have thoughts about taking your own life, get help right away. You can go to your nearest emergency department or call:  Your local emergency services (911 in the [place]  A suicide crisis helpline, such as the National Suicide Prevention Lifeline: ? 512-222-32051-814-271-3546. This is open 24 hours a day.  This information is not intended to replace advice given to you by your health care provider. Make sure you discuss any questions you have with your health care provider. Document Released: 01/09/2015 Document Revised: 10/15/2015 Document Reviewed: 10/15/2015 Elsevier Interactive Patient Education  2017 [employer]

## 2016-09-18 NOTE — Progress Notes (Signed)
Patient ID: Terri [name], female   DOB: 1985-03-15, 31 y.o.   MRN: [id number]    Terri [name], Terri [name] 07/14/1985, 31 y.o., female MRN: [id number] Patient Care Team    Relationship Specialty Notifications Start End  Natalia Leatherwood, DO PCP - General Family Medicine  05/16/15   Ardell Isaacs, MD Consulting Physician Pain Medicine  03/17/13   Drema Dallas, DO Consulting Physician Neurology  03/24/15     Chief Complaint  Patient presents with  . Anxiety    Subjective: Pt presents for an OV with Increased complaint of anxiety and depression. She states over the last 2 months something has changed, she is not certain what it is and she is feeling depressed. She has been on medications in the past, and has seen counseling when she was a teenager. She reports she always felt like she really didn't need medications and could handle things on her own. Last noted she was on Celexa a few years ago, and she felt like she didn't really need it and she was doing well. She reports this has changed over the last few months, she found that she is being very hard on herself and critical. She used to love to this dinner parties and be social with her family and friends, and now she doesn't enjoy as much because she is critical about what she says what she thinks she is cooking etc. She started a new job a few months ago, but states that she loves it, her boss is great since she does not think this is the source of her issue. She is prescribed Klonopin twice a day when necessary for her long-term anxiety, she in the past has infrequently uses. She states she has been using it regularly as scheduled last few weeks. She also has continued to use her Ambien when needed. She states she knows she has some guilt surrounding the child she put up for adoption about 9 years ago. Her family has contact with the child (daughter), but patient does not. She has always felt she has dealt with that situation well, and did it was all right  for the child, but admits today maybe she is dealing with it as well as she thought.  Depression screen [facility] 2/9 09/18/2016 05/30/2015  Decreased Interest 1 0  Down, Depressed, Hopeless 3 0  PHQ - 2 Score 4 0  Altered sleeping 1 -  Tired, decreased energy 1 -  Change in appetite 2 -  Feeling bad or failure about yourself  1 -  Trouble concentrating 1 -  Moving slowly or fidgety/restless 0 -  Suicidal thoughts 0 -  PHQ-9 Score 10 -   GAD 7 : Generalized Anxiety Score 09/18/2016  Nervous, Anxious, on Edge 3  Control/stop worrying 3  Worry too much - different things 3  Trouble relaxing 1  Restless 0  Easily annoyed or irritable 1  Afraid - awful might happen 0  Total GAD 7 Score 11     Allergies  Allergen Reactions  . Penicillins Rash   Social History  Substance Use Topics  . Smoking status: Former Smoker    Packs/day: 0.30    Types: Cigarettes    Quit date: 02/12/2007  . Smokeless tobacco: Never Used  . Alcohol use Yes     Comment: occasional   Past Medical History:  Diagnosis Date  . Allergy   . Brain injury [facility]) 31 yrs old   fell off babysitters cabinet, and on a  separate occasion she fell down a flight of stairs  . Closed head injury About age 76 yrs   ICH per pt, with seizure disorder for appox 2 yrs afterward.  No significant residual disability.  . Depression with anxiety    Failed buspar.  Did well on citalopram.  . Drug overdose 2004   Xanax  . H/O varicella   . History of chlamydia   . HSV infection    Genital clx pos HSV 2--2007.  Marland Kitchen. Hx of migraines    chronic migraines (Dr. Everlena CooperJaffe) 03/2015  . Hx: UTI (urinary tract infection) 09/06/10  . IBS (irritable bowel syndrome)   . Right hip pain 05/2009   MRI 10/12/09: Femoral head and neck stress injury plus small acetabular labral tear.  [facility] ortho (Dr. Caswell CorwinStubbs) as of 11/2012.  Preferred pain mgmt: steroid injection into right subtrochanteric bursa 12/06/15.  . Seizure [facility])    for 2 yrs after closed head injury/ICH     Past Surgical History:  Procedure Laterality Date  . HIP SURGERY  10/10/2010   Right hip GT bursectomy and osteophytectomy for intractable right greater troch pain (Dr. Despina HickAlusio).  [facility] second opinion 06/2012--resulted in repeat MRI, then right hip IA injection that gave mild short term response, then was referred to PT with iontophoresis (as of 09/07/12)--was told to f/u in 6 wks.  Marland Kitchen. MOLE REMOVAL     on back  . WISDOM TOOTH EXTRACTION     Family History  Problem Relation Age of Onset  . Breast cancer Mother [id number]  . Thyroid cancer Mother   . Hepatitis Father        Hep C  . Alcohol abuse Father   . Thyroid disease Sister   . Anxiety disorder Sister   . Thyroid cancer Sister   . Thrombophlebitis Maternal Grandfather   . Anxiety disorder Maternal Grandfather   . Heart disease Maternal Grandfather   . Alzheimer's disease Paternal Grandmother    Allergies as of 09/18/2016      Reactions   Penicillins Rash      Medication List       Accurate as of 09/18/16  9:39 AM. Always use your most recent med list.          albuterol 108 (90 Base) MCG/ACT inhaler Commonly known as:  PROVENTIL HFA;VENTOLIN HFA Inhale 2 puffs into the lungs every 6 (six) hours as needed for wheezing or shortness of breath.   baclofen 10 MG tablet Commonly known as:  LIORESAL TAKE 1 TABLET BY MOUTH 2 TIMES DAILY AS NEEDED   clonazePAM 0.5 MG tablet Commonly known as:  KLONOPIN Take 1 tablet (0.5 mg total) by mouth 2 (two) times daily as needed for anxiety.   DEPO-PROVERA IM Inject into the muscle every 3 (three) months.   morphine 15 MG tablet Commonly known as:  MSIR Take 15 mg by mouth every 8 (eight) hours as needed for severe pain.   rizatriptan 10 MG tablet Commonly known as:  MAXALT May repeat in 2 hours if needed--max of 30mg  in 24h period.   valACYclovir 1000 MG tablet Commonly known as:  VALTREX Take 1 tablet (1,000 mg total) by mouth daily.   vitamin B-12 100 MCG tablet Commonly known  as:  CYANOCOBALAMIN Take 1 tablet (100 mcg total) by mouth daily.   Vitamin D3 1000 units Caps Take by mouth.   zolpidem 6.25 MG CR tablet Commonly known as:  AMBIEN CR Take 2 tablets (12.5 mg total) by mouth at  bedtime as needed for sleep.       No results found for this or any previous visit (from the past 24 hour(s)). No results found.   ROS: Negative, with the exception of above mentioned in HPI  Objective:  BP 106/72 (BP Location: Left Arm, Patient Position: Sitting, Cuff Size: Normal)   Pulse 87   Temp 98.2 F (36.8 C)   Resp 20   Ht 5\' 5"  (1.651 m)   Wt 121 lb (54.9 kg)   SpO2 100%   BMI 20.14 kg/m  Body mass index is 20.14 kg/m. Gen: Afebrile. No acute distress. Tearful. HENT: AT. [place]. MMM.  Psych: Tearful, sad, anxious. Normal dress. Normal speech, normal thought content and judgment.    Assessment/Plan: Domanique L Coulibaly is a 31 y.o. female present for acute OV for  Generalized anxiety disorder Depression with anxiety Panic disorder Sleep disorder - PHQ and GAD significant today (10/11)A she is struggling through a difficult time. Discussed with her in length different options for treatment including medication and counseling. Patient is agreeable to follow-up today. She does have reservations about medications, and is hard on herself concerning being not able to handle this on her own without medications. Patient is aware medication needs to be tapered on and off, never stopped abruptly. She is agreeable to this treatment plan. Of course reassurance was given to her that it takes a stronger person to identify issues and ask for help, than to struggle on her own. Also discussed therapy, which she is agreeable to. Will refer to Dr. Dewayne Hatch here at [facility] for counseling. - Effexor taper to 75 mg, follow-up in 4 weeks. - Refills on Klonopin 0.5 mg twice a day when necessary. Will need to follow-up every 6 months on this medication if desiring continuation since  it is controlled substance. a controlled substance database has been reviewed today, appropriate, and made part of her permanent record. - Refills on Ambien, all very 6 months - Follow-up in 4 weeks on start of the medication.   > 25 minutes spent with patient, >50% of time spent face to face counseling   electronically signed by:  Felix Pacini, DO  [facility] - [place]

## 2016-09-19 ENCOUNTER — Other Ambulatory Visit: Payer: Self-pay | Admitting: *Deleted

## 2016-09-19 NOTE — Telephone Encounter (Signed)
Opened in error

## 2016-10-25 ENCOUNTER — Ambulatory Visit: Payer: BLUE CROSS/BLUE SHIELD | Admitting: Family Medicine

## 2016-10-25 ENCOUNTER — Encounter: Payer: Self-pay | Admitting: Family Medicine

## 2016-10-25 ENCOUNTER — Ambulatory Visit (INDEPENDENT_AMBULATORY_CARE_PROVIDER_SITE_OTHER): Payer: BLUE CROSS/BLUE SHIELD | Admitting: Family Medicine

## 2016-10-25 VITALS — BP 95/65 | HR 88 | Temp 97.9°F | Resp 20 | Wt 118.5 lb

## 2016-10-25 DIAGNOSIS — F411 Generalized anxiety disorder: Secondary | ICD-10-CM | POA: Diagnosis not present

## 2016-10-25 DIAGNOSIS — G479 Sleep disorder, unspecified: Secondary | ICD-10-CM

## 2016-10-25 DIAGNOSIS — F418 Other specified anxiety disorders: Secondary | ICD-10-CM | POA: Diagnosis not present

## 2016-10-25 DIAGNOSIS — Z3042 Encounter for surveillance of injectable contraceptive: Secondary | ICD-10-CM

## 2016-10-25 MED ORDER — ONDANSETRON HCL 4 MG PO TABS: 4.0000 mg | ORAL_TABLET | Freq: Three times a day (TID) | ORAL | 0 refills | Status: DC | PRN

## 2016-10-25 MED ORDER — MEDROXYPROGESTERONE ACETATE 150 MG/ML IM SUSP
150.0000 mg | Freq: Once | INTRAMUSCULAR | Status: AC
  Administered 2016-10-25: 150 mg via INTRAMUSCULAR

## 2016-10-25 MED ORDER — VENLAFAXINE HCL ER 75 MG PO CP24: 75.0000 mg | ORAL_CAPSULE | Freq: Every day | ORAL | 0 refills | Status: DC

## 2016-10-25 NOTE — Progress Notes (Signed)
Patient ID: Terri [name], female   DOB: [name], 31 y.o.   MRN: [id number]    Terri [name], Terri [name], 31 y.o., female MRN: [id number] Patient Care Team    Relationship Specialty Notifications Start End  Natalia Leatherwood, DO PCP - General Family Medicine  05/16/15   Ardell Isaacs, MD Consulting Physician Pain Medicine  03/17/13   Drema Dallas, DO Consulting Physician Neurology  03/24/15     Chief Complaint  Patient presents with  . Anxiety  . Depression  . Contraception    Subjective:  Pt presents or follow up on anxiety today. She was started on effexor 75 mg Qd 4 weeks ago. She has been klonopin as needed for some time. She uses Ambien as needed at night.  She is tolerating the medication without side effects. She feels she is improved, nay need an increase later.   Prior note 09/18/2016: Pt presents for an OV with Increased complaint of anxiety and depression. She states over the last 2 months something has changed, she is not certain what it is and she is feeling depressed. She has been on medications in the past, and has seen counseling when she was a teenager. She reports she always felt like she really didn't need medications and could handle things on her own. Last noted she was on Celexa a few years ago, and she felt like she didn't really need it and she was doing well. She reports this has changed over the last few months, she found that she is being very hard on herself and critical. She used to love to this dinner parties and be social with her family and friends, and now she doesn't enjoy as much because she is critical about what she says what she thinks she is cooking etc. She started a new job a few months ago, but states that she loves it, her boss is great since she does not think this is the source of her issue. She is prescribed Klonopin twice a day when necessary for her long-term anxiety, she in the past has infrequently uses. She states she has been using it regularly  as scheduled last few weeks. She also has continued to use her Ambien when needed. She states she knows she has some guilt surrounding the child she put up for adoption about 9 years ago. Her family has contact with the child (daughter), but patient does not. She has always felt she has dealt with that situation well, and did it was all right for the child, but admits today maybe she is dealing with it as well as she thought.  Birth control: Pt due for depo provera.   Depression screen [facility] 2/9 10/25/2016 09/18/2016 05/30/2015  Decreased Interest 0 1 0  Down, Depressed, Hopeless 0 3 0  PHQ - 2 Score 0 4 0  Altered sleeping 0 1 -  Tired, decreased energy 1 1 -  Change in appetite 2 2 -  Feeling bad or failure about yourself  0 1 -  Trouble concentrating 0 1 -  Moving slowly or fidgety/restless 0 0 -  Suicidal thoughts 0 0 -  PHQ-9 Score 3 10 -  Difficult doing work/chores Not difficult at all - -   GAD 7 : Generalized Anxiety Score 10/25/2016 09/18/2016  Nervous, Anxious, on Edge 1 3  Control/stop worrying 0 3  Worry too much - different things 1 3  Trouble relaxing 1 1  Restless 1 0  Easily annoyed or  irritable 0 1  Afraid - awful might happen 0 0  Total GAD 7 Score 4 11  Anxiety Difficulty Somewhat difficult -     Allergies  Allergen Reactions  . Penicillins Rash   Social History  Substance Use Topics  . Smoking status: Former Smoker    Packs/day: 0.30    Types: Cigarettes    Quit date: 02/12/2007  . Smokeless tobacco: Never Used  . Alcohol use Yes     Comment: occasional   Past Medical History:  Diagnosis Date  . Allergy   . Brain injury [facility]) 31 yrs old   fell off babysitters cabinet, and on a separate occasion she fell down a flight of stairs  . Closed head injury About age 9 yrs   ICH per pt, with seizure disorder for appox 2 yrs afterward.  No significant residual disability.  . Depression with anxiety    Failed buspar.  Did well on citalopram.  . Drug overdose 2004     Xanax  . H/O varicella   . History of chlamydia   . HSV infection    Genital clx pos HSV 2--2007.  Marland Kitchen Hx of migraines    chronic migraines (Dr. Everlena Cooper) 03/2015  . Hx: UTI (urinary tract infection) 09/06/10  . IBS (irritable bowel syndrome)   . Right hip pain 05/2009   MRI 10/12/09: Femoral head and neck stress injury plus small acetabular labral tear.  [facility] ortho (Dr. Caswell Corwin) as of 11/2012.  Preferred pain mgmt: steroid injection into right subtrochanteric bursa 12/06/15.  . Seizure [facility])    for 2 yrs after closed head injury/ICH   Past Surgical History:  Procedure Laterality Date  . HIP SURGERY  10/10/2010   Right hip GT bursectomy and osteophytectomy for intractable right greater troch pain (Dr. Despina Hick).  [facility] second opinion 06/2012--resulted in repeat MRI, then right hip IA injection that gave mild short term response, then was referred to PT with iontophoresis (as of 09/07/12)--was told to f/u in 6 wks.  Marland Kitchen MOLE REMOVAL     on back  . WISDOM TOOTH EXTRACTION     Family History  Problem Relation Age of Onset  . Breast cancer Mother 58  . Thyroid cancer Mother   . Hepatitis Father        Hep C  . Alcohol abuse Father   . Thyroid disease Sister   . Anxiety disorder Sister   . Thyroid cancer Sister   . Thrombophlebitis Maternal Grandfather   . Anxiety disorder Maternal Grandfather   . Heart disease Maternal Grandfather   . Alzheimer's disease Paternal Grandmother    Allergies as of 10/25/2016      Reactions   Penicillins Rash      Medication List       Accurate as of 10/25/16 11:06 AM. Always use your most recent med list.          albuterol 108 (90 Base) MCG/ACT inhaler Commonly known as:  PROVENTIL HFA;VENTOLIN HFA Inhale 2 puffs into the lungs every 6 (six) hours as needed for wheezing or shortness of breath.   baclofen 10 MG tablet Commonly known as:  LIORESAL TAKE 1 TABLET BY MOUTH 2 TIMES DAILY AS NEEDED   clonazePAM 0.5 MG tablet Commonly known as:   KLONOPIN Take 1 tablet (0.5 mg total) by mouth 2 (two) times daily as needed for anxiety.   DEPO-PROVERA IM Inject into the muscle every 3 (three) months.   morphine 15 MG tablet Commonly known as:  MSIR Take 15 mg by mouth every 8 (eight) hours as needed for severe pain.   rizatriptan 10 MG tablet Commonly known as:  MAXALT May repeat in 2 hours if needed--max of  in 24h period.   valACYclovir 1000 MG tablet Commonly known as:  VALTREX Take 1 tablet (1,000 mg total) by mouth daily.   venlafaxine XR 75 MG 24 hr capsule Commonly known as:  EFFEXOR-XR Take 1 capsule (75 mg total) by mouth daily with breakfast.   vitamin B-12 100 MCG tablet Commonly known as:  CYANOCOBALAMIN Take 1 tablet (100 mcg total) by mouth daily.   Vitamin D3 1000 units Caps Take by mouth.   zolpidem 6.25 MG CR tablet Commonly known as:  AMBIEN CR Take 2 tablets (12.5 mg total) by mouth at bedtime as needed for sleep.       No results found for this or any previous visit (from the past 24 hour(s)). No results found.   ROS: Negative, with the exception of above mentioned in HPI  Objective:  BP 95/65 (BP Location: Right Arm, Patient Position: Sitting, Cuff Size: Normal)   Pulse 88   Temp 97.9 F (36.6 C)   Resp 20   Wt 118 lb 8 oz (53.8 kg)   SpO2 98%   BMI 19.72 kg/m  Body mass index is 19.72 kg/m. Gen: Afebrile. No acute distress.  Psych: Normal affect, dress and demeanor. Normal speech. Normal thought content and judgment.  Assessment/Plan: Blu L Arnett is a 30 y.o. female present for acute OV for  Generalized anxiety disorder Depression with anxiety Panic disorder Sleep disorder - Stable on new medication.  - continue effexor 75 mg, may need to increase in another month.  - Dr. Evonnie Dawes for counseling-->could not get scheduled bc of schedules - Klonopin 0.5 mg twice a day when necessary. Will need to follow-up every 6 months on this medication if desiring  continuation since it is controlled substance. a controlled substance database has been reviewed today, appropriate, and made part of her permanent record. Refills not needed today. - Refills on Ambien, q 6 months - Follow-up in 3 months, or 1 month if feeling needs increase.   Birth control: depo provera injection provided today.   electronically signed by:  Felix Pacini, DO  [facility] - [place]

## 2016-10-25 NOTE — Patient Instructions (Addendum)
Have fun on your trip!  Zofran prescribed.    Effexor refilled for 3 months, follow up before running out of medicine or at 1-2 months if you feel you could use a higher dose.

## 2016-11-19 DIAGNOSIS — Z23 Encounter for immunization: Secondary | ICD-10-CM | POA: Diagnosis not present

## 2016-11-19 DIAGNOSIS — J069 Acute upper respiratory infection, unspecified: Secondary | ICD-10-CM | POA: Diagnosis not present

## 2016-11-19 DIAGNOSIS — H66001 Acute suppurative otitis media without spontaneous rupture of ear drum, right ear: Secondary | ICD-10-CM | POA: Diagnosis not present

## 2016-11-19 DIAGNOSIS — J029 Acute pharyngitis, unspecified: Secondary | ICD-10-CM | POA: Diagnosis not present

## 2016-11-26 ENCOUNTER — Encounter: Payer: Self-pay | Admitting: Family Medicine

## 2016-11-26 ENCOUNTER — Ambulatory Visit (INDEPENDENT_AMBULATORY_CARE_PROVIDER_SITE_OTHER): Payer: BLUE CROSS/BLUE SHIELD | Admitting: Family Medicine

## 2016-11-26 VITALS — BP 110/70 | HR 89 | Temp 98.4°F | Resp 20 | Wt 120.5 lb

## 2016-11-26 DIAGNOSIS — J0100 Acute maxillary sinusitis, unspecified: Secondary | ICD-10-CM

## 2016-11-26 MED ORDER — DOXYCYCLINE HYCLATE 100 MG PO TABS: 100.0000 mg | ORAL_TABLET | Freq: Two times a day (BID) | ORAL | 0 refills | Status: DC

## 2016-11-26 NOTE — Patient Instructions (Signed)
Rest, hydrate.  + flonase, mucinex, nettie pot or nasal saline.  Start Claritin or zyrtec daily Doxycyline every 12 hours for 10 days prescribed, take until completed.  If cough present it can last up to 6-8 weeks.  F/U 2 weeks of not improved.      Sinusitis, Adult Sinusitis is soreness and inflammation of your sinuses. Sinuses are hollow spaces in the bones around your face. They are located:  Around your eyes.  In the middle of your forehead.  Behind your nose.  In your cheekbones.  Your sinuses and nasal passages are lined with a stringy fluid (mucus). Mucus normally drains out of your sinuses. When your nasal tissues get inflamed or swollen, the mucus can get trapped or blocked so air cannot flow through your sinuses. This lets bacteria, viruses, and funguses grow, and that leads to infection. Follow these instructions at home: Medicines  Take, use, or apply over-the-counter and prescription medicines only as told by your doctor. These may include nasal sprays.  If you were prescribed an antibiotic medicine, take it as told by your doctor. Do not stop taking the antibiotic even if you start to feel better. Hydrate and Humidify  Drink enough water to keep your pee (urine) clear or pale yellow.  Use a cool mist humidifier to keep the humidity level in your home above 50%.  Breathe in steam for 10-15 minutes, 3-4 times a day or as told by your doctor. You can do this in the bathroom while a hot shower is running.  Try not to spend time in cool or dry air. Rest  Rest as much as possible.  Sleep with your head raised (elevated).  Make sure to get enough sleep each night. General instructions  Put a warm, moist washcloth on your face 3-4 times a day or as told by your doctor. This will help with discomfort.  Wash your hands often with soap and water. If there is no soap and water, use hand sanitizer.  Do not smoke. Avoid being around people who are smoking (secondhand  smoke).  Keep all follow-up visits as told by your doctor. This is important. Contact a doctor if:  You have a fever.  Your symptoms get worse.  Your symptoms do not get better within 10 days. Get help right away if:  You have a very bad headache.  You cannot stop throwing up (vomiting).  You have pain or swelling around your face or eyes.  You have trouble seeing.  You feel confused.  Your neck is stiff.  You have trouble breathing. This information is not intended to replace advice given to you by your health care provider. Make sure you discuss any questions you have with your health care provider. Document Released: 07/17/2007 Document Revised: 09/24/2015 Document Reviewed: 11/23/2014 Elsevier Interactive Patient Education  [employer].

## 2016-11-26 NOTE — Progress Notes (Signed)
Terri [name] , 1985/07/15, 31 y.o., female MRN: [id number] Patient Care Team    Relationship Specialty Notifications Start End  Natalia Leatherwood, DO PCP - General Family Medicine  05/16/15   Ardell Isaacs, MD Consulting Physician Pain Medicine  03/17/13   Drema Dallas, DO Consulting Physician Neurology  03/24/15     Chief Complaint  Patient presents with  . Ear Problem    pressure in right ear recently treated for ear infection     Subjective: Pt presents for an OV with complaints of Right ear pressure and sinus discomfort of 10 days duration.  Associated symptoms include nasal drainage, postnasal drip, sore throat. Patient denies fever, chills, nausea or vomit. She had been on a cruise when symptoms started. She reports her ear started bothering her after swimming with the [place]. She was seen in urgent care upon her return which diagnosed her with an ear infection and treated with what sounds like a 3 day course of azithromycin. Patient reports she finished her antibiotics last week, and her symptoms continue to worsen and not improved.  Depression screen [facility] 2/9 10/25/2016 09/18/2016 05/30/2015  Decreased Interest 0 1 0  Down, Depressed, Hopeless 0 3 0  PHQ - 2 Score 0 4 0  Altered sleeping 0 1 -  Tired, decreased energy 1 1 -  Change in appetite 2 2 -  Feeling bad or failure about yourself  0 1 -  Trouble concentrating 0 1 -  Moving slowly or fidgety/restless 0 0 -  Suicidal thoughts 0 0 -  PHQ-9 Score 3 10 -  Difficult doing work/chores Not difficult at all - -    Allergies  Allergen Reactions  . Penicillins Rash   Social History  Substance Use Topics  . Smoking status: Former Smoker    Packs/day: 0.30    Types: Cigarettes    Quit date: 02/12/2007  . Smokeless tobacco: Never Used  . Alcohol use Yes     Comment: occasional   Past Medical History:  Diagnosis Date  . Allergy   . Brain injury [facility]) 31 yrs old   fell off babysitters cabinet, and on a separate occasion  she fell down a flight of stairs  . Closed head injury About age 31 yrs   ICH per pt, with seizure disorder for appox 2 yrs afterward.  No significant residual disability.  . Depression with anxiety    Failed buspar.  Did well on citalopram.  . Drug overdose 2004   Xanax  . H/O varicella   . History of chlamydia   . HSV infection    Genital clx pos HSV 2--2007.  Marland Kitchen Hx of migraines    chronic migraines (Dr. Everlena Cooper) 03/2015  . Hx: UTI (urinary tract infection) 09/06/10  . IBS (irritable bowel syndrome)   . Right hip pain 05/2009   MRI 10/12/09: Femoral head and neck stress injury plus small acetabular labral tear.  [facility] ortho (Dr. Caswell Corwin) as of 11/2012.  Preferred pain mgmt: steroid injection into right subtrochanteric bursa 12/06/15.  . Seizure [facility])    for 2 yrs after closed head injury/ICH   Past Surgical History:  Procedure Laterality Date  . HIP SURGERY  10/10/2010   Right hip GT bursectomy and osteophytectomy for intractable right greater troch pain (Dr. Despina Hick).  [facility] second opinion 06/2012--resulted in repeat MRI, then right hip IA injection that gave mild short term response, then was referred to PT with iontophoresis (as of 09/07/12)--was told to f/u  in 6 wks.  Marland Kitchen MOLE REMOVAL     on back  . WISDOM TOOTH EXTRACTION     Family History  Problem Relation Age of Onset  . Breast cancer Mother 33  . Thyroid cancer Mother   . Hepatitis Father        Hep C  . Alcohol abuse Father   . Thyroid disease Sister   . Anxiety disorder Sister   . Thyroid cancer Sister   . Thrombophlebitis Maternal Grandfather   . Anxiety disorder Maternal Grandfather   . Heart disease Maternal Grandfather   . Alzheimer's disease Paternal Grandmother    Allergies as of 11/26/2016      Reactions   Penicillins Rash      Medication List       Accurate as of 11/26/16  2:47 PM. Always use your most recent med list.          albuterol 108 (90 Base) MCG/ACT inhaler Commonly known as:  PROVENTIL  HFA;VENTOLIN HFA Inhale 2 puffs into the lungs every 6 (six) hours as needed for wheezing or shortness of breath.   baclofen 10 MG tablet Commonly known as:  LIORESAL TAKE 1 TABLET BY MOUTH 2 TIMES DAILY AS NEEDED   clonazePAM 0.5 MG tablet Commonly known as:  KLONOPIN Take 1 tablet (0.5 mg total) by mouth 2 (two) times daily as needed for anxiety.   DEPO-PROVERA IM Inject into the muscle every 3 (three) months.   morphine 15 MG tablet Commonly known as:  MSIR Take 15 mg by mouth every 8 (eight) hours as needed for severe pain.   ondansetron 4 MG tablet Commonly known as:  ZOFRAN Take 1 tablet (4 mg total) by mouth every 8 (eight) hours as needed for nausea or vomiting.   rizatriptan 10 MG tablet Commonly known as:  MAXALT May repeat in 2 hours if needed--max of  in 24h period.   valACYclovir 1000 MG tablet Commonly known as:  VALTREX Take 1 tablet (1,000 mg total) by mouth daily.   venlafaxine XR 75 MG 24 hr capsule Commonly known as:  EFFEXOR-XR Take 1 capsule (75 mg total) by mouth daily with breakfast.   vitamin B-12 100 MCG tablet Commonly known as:  CYANOCOBALAMIN Take 1 tablet (100 mcg total) by mouth daily.   Vitamin D3 1000 units Caps Take by mouth.   zolpidem 6.25 MG CR tablet Commonly known as:  AMBIEN CR Take 2 tablets (12.5 mg total) by mouth at bedtime as needed for sleep.       All past medical history, surgical history, allergies, family history, immunizations andmedications were updated in the EMR today and reviewed under the history and medication portions of their EMR.     ROS: Negative, with the exception of above mentioned in HPI   Objective:  BP 110/70 (BP Location: Right Arm, Patient Position: Sitting, Cuff Size: Normal)   Pulse 89   Temp 98.4 F (36.9 C)   Resp 20   Wt 120 lb 8 oz (54.7 kg)   SpO2 98%   BMI 20.05 kg/m  Body mass index is 20.05 kg/m. Gen: Afebrile. No acute distress. Nontoxic in appearance, well developed,  well nourished.  HENT: AT. [place]. Bilateral TM visualized Without erythema or swelling, fullness right tympanic membrane. No perforations.. MMM, no oral lesions. Bilateral nares with moderate to severe swelling, drainage and erythema. Throat without erythema or exudates. Postnasal drip present, mild hoarseness, mild cough. Eyes:Pupils Equal Round Reactive to light, Extraocular movements intact,  Conjunctiva without redness, discharge or icterus. Neck/lymp/endocrine: Supple, no lymphadenopathy CV: RRR  Chest: CTAB, no wheeze or crackles.  Abd: Soft. NTND. BS present.  Skin: no rashes, purpura or petechiae.  Neuro: Normal gait. PERLA. EOMi. Alert. Oriented x3   No exam data present No results found. No results found for this or any previous visit (from the past 24 hour(s)).  Assessment/Plan: Terri [name] is a 31 y.o. female present for OV for  Acute maxillary sinusitis, recurrence not specified Rest, hydrate.  + flonase, mucinex (DM if cough), nettie pot or nasal saline. Avoid Sudafed. Doxycycline prescribed, take until completed.  If cough present it can last up to 6-8 weeks.  F/U 2 weeks of not improved.   Reviewed expectations re: course of current medical issues.  Discussed self-management of symptoms.  Outlined signs and symptoms indicating need for more acute intervention.  Patient verbalized understanding and all questions were answered.  Patient received an After-Visit Summary.    No orders of the defined types were placed in this encounter.    Note is dictated utilizing voice recognition software. Although note has been proof read prior to signing, occasional typographical errors still can be missed. If any questions arise, please do not hesitate to call for verification.   electronically signed by:  Felix Pacini, DO  [facility] [place]

## 2016-12-01 ENCOUNTER — Emergency Department (HOSPITAL_BASED_OUTPATIENT_CLINIC_OR_DEPARTMENT_OTHER)
Admission: EM | Admit: 2016-12-01 | Discharge: 2016-12-01 | Disposition: A | Discharge status: Home / Self Care | Payer: BLUE CROSS/BLUE SHIELD | Attending: Emergency Medicine | Admitting: Emergency Medicine

## 2016-12-01 ENCOUNTER — Emergency Department (HOSPITAL_BASED_OUTPATIENT_CLINIC_OR_DEPARTMENT_OTHER): Payer: BLUE CROSS/BLUE SHIELD

## 2016-12-01 ENCOUNTER — Encounter (HOSPITAL_BASED_OUTPATIENT_CLINIC_OR_DEPARTMENT_OTHER): Payer: Self-pay | Admitting: Emergency Medicine

## 2016-12-01 DIAGNOSIS — Z79899 Other long term (current) drug therapy: Secondary | ICD-10-CM | POA: Insufficient documentation

## 2016-12-01 DIAGNOSIS — Y999 Unspecified external cause status: Secondary | ICD-10-CM | POA: Insufficient documentation

## 2016-12-01 DIAGNOSIS — S060X0A Concussion without loss of consciousness, initial encounter: Secondary | ICD-10-CM | POA: Insufficient documentation

## 2016-12-01 DIAGNOSIS — S0990XA Unspecified injury of head, initial encounter: Secondary | ICD-10-CM | POA: Diagnosis not present

## 2016-12-01 DIAGNOSIS — W19XXXA Unspecified fall, initial encounter: Secondary | ICD-10-CM

## 2016-12-01 DIAGNOSIS — Y929 Unspecified place or not applicable: Secondary | ICD-10-CM | POA: Insufficient documentation

## 2016-12-01 DIAGNOSIS — Z87891 Personal history of nicotine dependence: Secondary | ICD-10-CM | POA: Insufficient documentation

## 2016-12-01 DIAGNOSIS — Y9301 Activity, walking, marching and hiking: Secondary | ICD-10-CM | POA: Diagnosis not present

## 2016-12-01 DIAGNOSIS — W0110XA Fall on same level from slipping, tripping and stumbling with subsequent striking against unspecified object, initial encounter: Secondary | ICD-10-CM | POA: Insufficient documentation

## 2016-12-01 NOTE — ED Provider Notes (Signed)
[facility] EMERGENCY DEPARTMENT Provider Note   CSN: [id number] Arrival date & time: 12/01/16  1957     History   Chief Complaint Chief Complaint  Patient presents with  . Fall    HPI Terri [name] is a 31 y.o. female with a history of brain injury and closed head injury (age 52) who presents to the ED today for fall and head injury. The patient states she was at a bar last night, and after having 5 drinks she slipped backwards on wet floor, hitting the back of her head on concrete. The patient denies LOC. She awoke this morning and noticed that she had dried blood in her hair and has painful area in her posterior occiput where she thinks a cut may be. She reports she urinated on herself overnight. No seizure like activity witnessed. No neck pain, mid or low back pain. Is now without bowel or bladder incontinence or urinary retention. Patient also awoke with a frontal, sharp HA that she rated as a 8/10. She took excedrin migraine and advil for this that brought the HA to a 2/10. HA resolved ~2-3 hours ago. The patient notes she had an episode in the morning that she describes as dizziness, blurred vision and feeling of heaviness in her extremities. The last for ~ 1 hour and subsided after the patient took a Clonipine. This has not reoccured. These symptoms have not reoccured. The patient denies any amnesia, photophobia, phonophobia, nausea, vomiting. The patient is up to date on tetanus. The patient is not on anticoagulation medicine.  HPI  Past Medical History:  Diagnosis Date  . Allergy   . Brain injury [facility]) 31 yrs old   fell off babysitters cabinet, and on a separate occasion she fell down a flight of stairs  . Closed head injury About age 52 yrs   ICH per pt, with seizure disorder for appox 2 yrs afterward.  No significant residual disability.  . Depression with anxiety    Failed buspar.  Did well on citalopram.  . Drug overdose 2004   Xanax  . H/O varicella   .  History of chlamydia   . HSV infection    Genital clx pos HSV 2--2007.  Marland Kitchen Hx of migraines    chronic migraines (Dr. Everlena Cooper) 03/2015  . Hx: UTI (urinary tract infection) 09/06/10  . IBS (irritable bowel syndrome)   . Right hip pain 05/2009   MRI 10/12/09: Femoral head and neck stress injury plus small acetabular labral tear.  [facility] ortho (Dr. Caswell Corwin) as of 11/2012.  Preferred pain mgmt: steroid injection into right subtrochanteric bursa 12/06/15.  . Seizure [facility])    for 2 yrs after closed head injury/ICH    Patient Active Problem List   Diagnosis Date Noted  . Sleep disorder 09/18/2016  . Genital HSV 08/24/2015  . Encounter for preventive health examination 05/30/2015  . History of traumatic brain injury 05/30/2015  . Chronic narcotic use 05/30/2015  . Vitamin D deficiency 05/30/2015  . Migraine 01/24/2015  . Allergic rhinitis 12/01/2014  . IBS (irritable bowel syndrome) 10/10/2014  . Acute myofascial pain 05/09/2014  . Trigger point of right side of body 05/09/2014  . Contraceptive management 12/17/2011  . Panic disorder 07/05/2010  . Generalized anxiety disorder 07/05/2010  . Depression with anxiety 07/05/2010  . Right hip pain 07/05/2010    Past Surgical History:  Procedure Laterality Date  . HIP SURGERY  10/10/2010   Right hip GT bursectomy and osteophytectomy for intractable right  greater troch pain (Dr. Despina Hick).  [facility] second opinion 06/2012--resulted in repeat MRI, then right hip IA injection that gave mild short term response, then was referred to PT with iontophoresis (as of 09/07/12)--was told to f/u in 6 wks.  Marland Kitchen MOLE REMOVAL     on back  . WISDOM TOOTH EXTRACTION      OB History    Gravida Para Term Preterm AB Living   2 2 2     1    SAB TAB Ectopic Multiple Live Births           1       Home Medications    Prior to Admission medications   Medication Sig Start Date End Date Taking? Authorizing Provider  albuterol (PROVENTIL HFA;VENTOLIN HFA) 108 (90 Base) MCG/ACT  inhaler Inhale 2 puffs into the lungs every 6 (six) hours as needed for wheezing or shortness of breath. 05/30/15   Kuneff, Renee A, DO  baclofen (LIORESAL) 10 MG tablet TAKE 1 TABLET BY MOUTH 2 TIMES DAILY AS NEEDED 02/17/15   [provider]  Cholecalciferol (VITAMIN D3) 1000 units CAPS Take by mouth.    [provider]  clonazePAM (KLONOPIN) 0.5 MG tablet Take 1 tablet (0.5 mg total) by mouth 2 (two) times daily as needed for anxiety. 09/18/16   Kuneff, Renee A, DO  doxycycline (VIBRA-TABS) 100 MG tablet Take 1 tablet (100 mg total) by mouth 2 (two) times daily. 11/26/16   Kuneff, Renee A, DO  MedroxyPROGESTERone Acetate (DEPO-PROVERA IM) Inject into the muscle every 3 (three) months.      [provider]  morphine (MSIR) 15 MG tablet Take 15 mg by mouth every 8 (eight) hours as needed for severe pain.    [provider]  ondansetron (ZOFRAN) 4 MG tablet Take 1 tablet (4 mg total) by mouth every 8 (eight) hours as needed for nausea or vomiting. 10/25/16   Kuneff, Renee A, DO  rizatriptan (MAXALT) 10 MG tablet May repeat in 2 hours if needed--max of 30mg  in 24h period. 12/20/15   Kuneff, Renee A, DO  valACYclovir (VALTREX) 1000 MG tablet Take 1 tablet (1,000 mg total) by mouth daily. 04/12/16   Kuneff, Renee A, DO  venlafaxine XR (EFFEXOR-XR) 75 MG 24 hr capsule Take 1 capsule (75 mg total) by mouth daily with breakfast. 10/25/16   Kuneff, Renee A, DO  vitamin B-12 (CYANOCOBALAMIN) 100 MCG tablet Take 1 tablet (100 mcg total) by mouth daily. 01/25/15   Kuneff, Renee A, DO  zolpidem (AMBIEN CR) 6.25 MG CR tablet Take 2 tablets (12.5 mg total) by mouth at bedtime as needed for sleep. 09/18/16   Natalia Leatherwood, DO    Family History Family History  Problem Relation Age of Onset  . Breast cancer Mother 32  . Thyroid cancer Mother   . Hepatitis Father        Hep C  . Alcohol abuse Father   . Thyroid disease Sister   . Anxiety disorder Sister   . Thyroid cancer Sister     . Thrombophlebitis Maternal Grandfather   . Anxiety disorder Maternal Grandfather   . Heart disease Maternal Grandfather   . Alzheimer's disease Paternal Grandmother     Social History Social History  Substance Use Topics  . Smoking status: Former Smoker    Packs/day: 0.30    Types: Cigarettes    Quit date: 02/12/2007  . Smokeless tobacco: Never Used  . Alcohol use Yes     Comment: occasional  Allergies   Penicillins   Review of Systems Review of Systems  All other systems reviewed and are negative.    Physical Exam Updated Vital Signs BP 108/63 (BP Location: Left Arm)   Pulse 89   Temp 98.1 F (36.7 C) (Oral)   Resp 16   SpO2 100%   Physical Exam  Constitutional: She appears well-developed and well-nourished.  Non-toxic appearing  HENT:  Head: Normocephalic and atraumatic. Head is without raccoon's eyes and without Battle's sign.  Right Ear: Hearing, tympanic membrane, external ear and ear canal normal. Tympanic membrane is not perforated and not erythematous. No hemotympanum.  Left Ear: Hearing, tympanic membrane, external ear and ear canal normal. Tympanic membrane is not perforated and not erythematous. No hemotympanum.  Nose: Nose normal. No rhinorrhea. Right sinus exhibits no maxillary sinus tenderness and no frontal sinus tenderness. Left sinus exhibits no maxillary sinus tenderness and no frontal sinus tenderness.  Mouth/Throat: Uvula is midline, oropharynx is clear and moist and mucous membranes are normal.  No CSF otorrhea. No open or depressed skull fracture palpated. TTP to scalp over posterior occiput laceration. No tongue trauma.  Eyes: Pupils are equal, round, and reactive to light. Conjunctivae, EOM and lids are normal. Right eye exhibits no discharge. Left eye exhibits no discharge. Right conjunctiva is not injected. Left conjunctiva is not injected. No scleral icterus. Right eye exhibits normal extraocular motion and no nystagmus. Left eye exhibits  normal extraocular motion and no nystagmus.  Neck: Trachea normal, normal range of motion, full passive range of motion without pain and phonation normal. Neck supple. No spinous process tenderness and no muscular tenderness present. No neck rigidity. No tracheal deviation and normal range of motion present.  Cardiovascular: Normal rate, regular rhythm, normal heart sounds and intact distal pulses.   Pulses:      Radial pulses are 2+ on the right side, and 2+ on the left side.       Dorsalis pedis pulses are 2+ on the right side, and 2+ on the left side.       Posterior tibial pulses are 2+ on the right side, and 2+ on the left side.  Pulmonary/Chest: Effort normal and breath sounds normal. No respiratory distress.  Neurological: She is alert. She has normal strength and normal reflexes. No cranial nerve deficit or sensory deficit. She displays a negative Romberg sign. Coordination and gait normal.  Reflex Scores:      Bicep reflexes are 2+ on the right side and 2+ on the left side.      Patellar reflexes are 2+ on the right side and 2+ on the left side.      Achilles reflexes are 2+ on the right side and 2+ on the left side. Mental Status: Alert, oriented, thought content appropriate, able to give a coherent history. Speech fluent without evidence of aphasia. Able to follow 2 step commands without difficulty. Cranial Nerves: II: Peripheral visual fields grossly normal, pupils equal, round, reactive to light III,IV, VI: ptosis not present, extra-ocular motions intact bilaterally V,VII: smile symmetric, eyebrows raise symmetric, facial light touch sensation equal VIII: hearing grossly normal to voice X: uvula elevates symmetrically XI: bilateral shoulder shrug symmetric and strong XII: midline tongue extension without fassiculations Motor: Normal tone. 5/5 in upper and lower extremities bilaterally including strong and equal grip strength and dorsiflexion/plantar flexion Sensory:  Sensation intact to light touch in all extremities.Negative Romberg.  Deep Tendon Reflexes: 2+ and symmetric in the biceps and patella Cerebellar: normal finger-to-nose with  bilateral upper extremities. Normal heel-to -shin balance bilaterally of the lower extremity. No pronator drift.  Gait: normal gait and balance CV: distal pulses palpable throughout  Skin: Skin is warm and dry. She is not diaphoretic. No pallor.  1cm laceration to posterior occupit, without gapping, appears to be healing with dried crusted blood over.   Psychiatric: She has a normal mood and affect.  Nursing note and vitals reviewed.    ED Treatments / Results  Labs (all labs ordered are listed, but only abnormal results are displayed) Labs Reviewed - No data to display  EKG  EKG Interpretation None       Radiology Ct Head Wo Contrast  Result Date: 12/01/2016 CLINICAL DATA:  Status post fall, with question of loss of consciousness. Concern for head injury. Initial encounter. EXAM: CT HEAD WITHOUT CONTRAST TECHNIQUE: Contiguous axial images were obtained from the base of the skull through the vertex without intravenous contrast. COMPARISON:  CT of the head performed 02/12/2015 FINDINGS: Brain: No evidence of acute infarction, hemorrhage, hydrocephalus, extra-axial collection or mass lesion/mass effect. The posterior fossa, including the cerebellum, brainstem and fourth ventricle, is within normal limits. The third and lateral ventricles, and basal ganglia are unremarkable in appearance. The cerebral hemispheres are symmetric in appearance, with normal gray-white differentiation. No mass effect or midline shift is seen. Vascular: No hyperdense vessel or unexpected calcification. Skull: There is no evidence of fracture; visualized osseous structures are unremarkable in appearance. Sinuses/Orbits: The visualized portions of the orbits are within normal limits. The paranasal sinuses and mastoid air cells are  well-aerated. Other: No significant soft tissue abnormalities are seen. IMPRESSION: No evidence of traumatic intracranial injury or fracture. Electronically Signed   By: Roanna RaiderJeffery  Chang M.D.   On: 12/01/2016 21:30    Procedures Procedures (including critical care time)  Medications Ordered in ED Medications - No data to display   Initial Impression / Assessment and Plan / ED Course  I have reviewed the triage vital signs and the nursing notes.  Pertinent labs & imaging results that were available during my care of the patient were reviewed by me and considered in my medical decision making (see chart for details).     Patient with head injury which did not cause of loss of consciousness after drinking last night. Patient notes episode of blurred vision and heavy feeling in extremities earlier that have now resolved. HA has now resolved. Patient notes awoke with urinary incontinence. No reoccurrence. No neck pain, low back pain, or bowel or bladder incontinence since. No urinary retention. No not suspect cauda equina. Possible seizure activity in sleep? No evidence of skull fracture on physical exam. There is small laceration that does not appear to need repair at this time. Area cleansed. Tetanus UTD. Patient with no focal neurologic deficits on physical exam. Patient  is not taking anticoagulants. However since patient has history of brain injury at age 71 and episode of incontinence will order CT to evaluate.   CT negative. Do to reassuring exam and negative CT do not feel patient needs further workup at this time. Discussed the likely etiology of patient's symptoms being concussive in nature.  Discussed with her to be aware of any seizure like activity. Patient will be discharged with information pertaining to diagnosis and advised to use over-the-counter medications like NSAIDs and Tylenol for pain relief. Pt has also advised to not participate in contact sports until they are completely  asymptomatic for at least 1 week or they are cleared  by their doctor. Strict return precautions discussed. Patient in agreement with plan. All questions answered. Appears safe for discharge.   Patient case discussed with Dr. Eudelia Bunch who is in agreement with plan.   Final Clinical Impressions(s) / ED Diagnoses   Final diagnoses:  Fall, initial encounter  Concussion without loss of consciousness, initial encounter    New Prescriptions New Prescriptions   No medications on file     Princella Pellegrini 12/01/16 2220    Nira Conn, MD 12/02/16 0005

## 2016-12-01 NOTE — Discharge Instructions (Signed)
Please read and follow all provided instructions.  Your diagnoses today include: Concussion   Tests performed today include: CT imaging of your head: This was reassuring  Vital signs. See below for your results today.   Home care instructions:  A concussion is a brain injury from a direct hit (blow) to the head or body or a jolt of the head or neck that causes the brain to move back and forth inside the skull (such as in a car crash). This blow causes the brain to shake quickly back and forth inside the skull. This can damage brain cells and cause chemical changes in the brain. A concussion may also be known as a mild traumatic brain injury (TBI). Concussions are usually not life-threatening, but the effects of a concussion can be serious. If you have a concussion, you are more likely to experience concussion-like symptoms after a direct blow to the head in the future.  Symptoms are usually temporary, but they may last for days, weeks, or even longer. Some symptoms may appear right away but other symptoms may not show up for hours or days. Every head injury is different. Symptoms may include Headaches. This can include a feeling of pressure in the head. Memory problems. Trouble concentrating, organizing, or making decisions. Slowness in thinking, acting or reacting, speaking, or reading. Confusion. Fatigue. Changes in eating or sleeping patterns. Problems with coordination or balance. Nausea or vomiting. Numbness or tingling. Sensitivity to light or noise. Vision or hearing problems. Reduced sense of smell. Irritability or mood changes. Dizziness. Lack of motivation. Seeing or hearing things that other people do not see or hear (hallucinations).   Please avoid alcohol for the next week.  Please rest and drink plenty of water.  We recommend that you avoid any activity that may lead to another head injury for at least 1 week and until you are cleared by your physician at follow up. We  also recommend "brain rest" - please avoid TV, cell phones, tablets, computers as much as possible for the next 48 hours.   Follow-up instructions: Please follow-up with your primary care provider this week for further evaluation of symptoms and treatment   Return instructions:  Please return to the Emergency Department if you do not get better, if you get worse, or new symptoms OR If you develop severe headaches, disequilibrium/difficulty walking, double vision, difficulty concentrating, sensitivity to light, changes in mood, nausea/vomiting, ongoing dizziness you can return for re-evaluation. Please return if you have any other emergent concerns.  Additional Information:  Your vital signs today were: BP 117/70 (BP Location: Right Arm)    Pulse 86    Temp 98.2 F (36.8 C) (Oral)    Resp 18    SpO2 100%  If your blood pressure (BP) was elevated above 135/85 this visit, please have this repeated by your doctor within one month. ---------------

## 2016-12-01 NOTE — ED Triage Notes (Addendum)
PT presents with c/o fall last night and headache with blurred vision today. PT sts she tripped when she fell. PT family member sts she had a traumatic fall when she was 31 years old.

## 2016-12-01 NOTE — ED Notes (Signed)
Small laceration to back of head, cleansed with Safe Clens as ordered.

## 2016-12-01 NOTE — ED Notes (Signed)
ED Provider at bedside. 

## 2016-12-01 NOTE — ED Notes (Signed)
Pt and FM given d/c instructions as per chart. Verbalize understanding. No questions. 

## 2017-01-29 ENCOUNTER — Encounter: Payer: Self-pay | Admitting: Family Medicine

## 2017-01-29 ENCOUNTER — Ambulatory Visit: Payer: BLUE CROSS/BLUE SHIELD | Admitting: Family Medicine

## 2017-01-29 VITALS — BP 114/80 | HR 99 | Temp 98.0°F | Wt 121.4 lb

## 2017-01-29 DIAGNOSIS — G479 Sleep disorder, unspecified: Secondary | ICD-10-CM

## 2017-01-29 DIAGNOSIS — F411 Generalized anxiety disorder: Secondary | ICD-10-CM

## 2017-01-29 DIAGNOSIS — Z23 Encounter for immunization: Secondary | ICD-10-CM

## 2017-01-29 DIAGNOSIS — F418 Other specified anxiety disorders: Secondary | ICD-10-CM

## 2017-01-29 DIAGNOSIS — F410 Panic disorder [episodic paroxysmal anxiety] without agoraphobia: Secondary | ICD-10-CM | POA: Diagnosis not present

## 2017-01-29 MED ORDER — VENLAFAXINE HCL ER 75 MG PO CP24: 150.0000 mg | ORAL_CAPSULE | Freq: Every day | ORAL | 1 refills | Status: DC

## 2017-01-29 MED ORDER — ZOLPIDEM TARTRATE ER 6.25 MG PO TBCR: 12.5000 mg | EXTENDED_RELEASE_TABLET | Freq: Every evening | ORAL | 1 refills | Status: DC | PRN

## 2017-01-29 MED ORDER — CLONAZEPAM 0.5 MG PO TABS
0.5000 mg | ORAL_TABLET | Freq: Two times a day (BID) | ORAL | 1 refills | Status: DC | PRN
Start: 2017-01-29 — End: 2017-08-29

## 2017-01-29 NOTE — Progress Notes (Signed)
Patient ID: Terri [name], female   DOB: [birth year], 31 y.o.   MRN: [id number]    Terri [name], Terri [name] 06-14-1985, 31 y.o., female MRN: [id number] Patient Care Team    Relationship Specialty Notifications Start End  Natalia Leatherwood, DO PCP - General Family Medicine  05/16/15   Ardell Isaacs, MD Consulting Physician Pain Medicine  03/17/13   Drema Dallas, DO Consulting Physician Neurology  03/24/15     Chief Complaint  Patient presents with  . Anxiety    follow up    Subjective:  Patient is present for 3 month follow-up on her depression, anxiety and sleep disturbance. She has been without the Klonopin and Ambien secondary to pharmacy issues. She did have active refill at the pharmacy available to her. She has not had a refill on this medication since August. She reports she is tolerating the Effexor 75 mg daily well. She does not feel her depression and anxiety is well controlled any longer. She is not sleeping without the use of the other anxiety medications. She has been offered counseling, but reports she does not feel she has the time take off of work. She feels the initial guilt feelings she was having one presented in August are resurfacing (see the full note below).  Prior note 09/18/2016: Pt presents for an OV with Increased complaint of anxiety and depression. She states over the last 2 months something has changed, she is not certain what it is and she is feeling depressed. She has been on medications in the past, and has seen counseling when she was a teenager. She reports she always felt like she really didn't need medications and could handle things on her own. Last noted she was on Celexa a few years ago, and she felt like she didn't really need it and she was doing well. She reports this has changed over the last few months, she found that she is being very hard on herself and critical. She used to love to this dinner parties and be social with her family and friends, and now she doesn't  enjoy as much because she is critical about what she says what she thinks she is cooking etc. She started a new job a few months ago, but states that she loves it, her boss is great since she does not think this is the source of her issue. She is prescribed Klonopin twice a day when necessary for her long-term anxiety, she in the past has infrequently uses. She states she has been using it regularly as scheduled last few weeks. She also has continued to use her Ambien when needed. She states she knows she has some guilt surrounding the child she put up for adoption about 9 years ago. Her family has contact with the child (daughter), but patient does not. She has always felt she has dealt with that situation well, and did it was all right for the child, but admits today maybe she is dealing with it as well as she thought.  Birth control: Pt due for depo provera.   Depression screen [facility] 2/9 01/29/2017 10/25/2016 09/18/2016 05/30/2015  Decreased Interest 2 0 1 0  Down, Depressed, Hopeless 2 0 3 0  PHQ - 2 Score 4 0 4 0  Altered sleeping 3 0 1 -  Tired, decreased energy 2 1 1  -  Change in appetite 1 2 2  -  Feeling bad or failure about yourself  2 0 1 -  Trouble concentrating 1 0  1 -  Moving slowly or fidgety/restless 1 0 0 -  Suicidal thoughts 0 0 0 -  PHQ-9 Score 14 3 10  -  Difficult doing work/chores - Not difficult at all - -   GAD 7 : Generalized Anxiety Score 01/29/2017 10/25/2016 09/18/2016  Nervous, Anxious, on Edge 1 1 3   Control/stop worrying 3 0 3  Worry too much - different things 2 1 3   Trouble relaxing 2 1 1   Restless 1 1 0  Easily annoyed or irritable 1 0 1  Afraid - awful might happen 0 0 0  Total GAD 7 Score 10 4 11   Anxiety Difficulty - Somewhat difficult -     Allergies  Allergen Reactions  . Penicillins Rash   Social History   Tobacco Use  . Smoking status: Former Smoker    Packs/day: 0.30    Types: Cigarettes    Last attempt to quit: 02/12/2007    Years since  quitting: 9.9  . Smokeless tobacco: Never Used  Substance Use Topics  . Alcohol use: Yes    Comment: occasional   Past Medical History:  Diagnosis Date  . Allergy   . Brain injury [facility](HCC) 31 yrs old   fell off babysitters cabinet, and on a separate occasion she fell down a flight of stairs  . Closed head injury About age 26 yrs   ICH per pt, with seizure disorder for appox 2 yrs afterward.  No significant residual disability.  . Depression with anxiety    Failed buspar.  Did well on citalopram.  . Drug overdose 2004   Xanax  . H/O varicella   . History of chlamydia   . HSV infection    Genital clx pos HSV 2--2007.  Marland Kitchen. Hx of migraines    chronic migraines (Dr. Everlena CooperJaffe) 03/2015  . Hx: UTI (urinary tract infection) 09/06/10  . IBS (irritable bowel syndrome)   . Right hip pain 05/2009   MRI 10/12/09: Femoral head and neck stress injury plus small acetabular labral tear.  [facility] ortho (Dr. Caswell CorwinStubbs) as of 11/2012.  Preferred pain mgmt: steroid injection into right subtrochanteric bursa 12/06/15.  . Seizure [facility](HCC)    for 2 yrs after closed head injury/ICH   Past Surgical History:  Procedure Laterality Date  . HIP SURGERY  10/10/2010   Right hip GT bursectomy and osteophytectomy for intractable right greater troch pain (Dr. Despina HickAlusio).  [facility] second opinion 06/2012--resulted in repeat MRI, then right hip IA injection that gave mild short term response, then was referred to PT with iontophoresis (as of 09/07/12)--was told to f/u in 6 wks.  Marland Kitchen. MOLE REMOVAL     on back  . WISDOM TOOTH EXTRACTION     Family History  Problem Relation Age of Onset  . Breast cancer Mother [id number]  . Thyroid cancer Mother   . Hepatitis Father        Hep C  . Alcohol abuse Father   . Thyroid disease Sister   . Anxiety disorder Sister   . Thyroid cancer Sister   . Thrombophlebitis Maternal Grandfather   . Anxiety disorder Maternal Grandfather   . Heart disease Maternal Grandfather   . Alzheimer's disease Paternal Grandmother      Allergies as of 01/29/2017      Reactions   Penicillins Rash      Medication List        Accurate as of 01/29/17  6:39 PM. Always use your most recent med list.  albuterol 108 (90 Base) MCG/ACT inhaler Commonly known as:  PROVENTIL HFA;VENTOLIN HFA Inhale 2 puffs into the lungs every 6 (six) hours as needed for wheezing or shortness of breath.   baclofen 10 MG tablet Commonly known as:  LIORESAL TAKE 1 TABLET BY MOUTH 2 TIMES DAILY AS NEEDED   clonazePAM 0.5 MG tablet Commonly known as:  KLONOPIN Take 1 tablet (0.5 mg total) by mouth 2 (two) times daily as needed for anxiety.   DEPO-PROVERA IM Inject into the muscle every 3 (three) months.   morphine 15 MG tablet Commonly known as:  MSIR Take 15 mg by mouth every 8 (eight) hours as needed for severe pain.   ondansetron 4 MG tablet Commonly known as:  ZOFRAN Take 1 tablet (4 mg total) by mouth every 8 (eight) hours as needed for nausea or vomiting.   rizatriptan 10 MG tablet Commonly known as:  MAXALT May repeat in 2 hours if needed--max of 30mg  in 24h period.   valACYclovir 1000 MG tablet Commonly known as:  VALTREX Take 1 tablet (1,000 mg total) by mouth daily.   venlafaxine XR 75 MG 24 hr capsule Commonly known as:  EFFEXOR-XR Take 2 capsules (150 mg total) by mouth daily with breakfast.   vitamin B-12 100 MCG tablet Commonly known as:  CYANOCOBALAMIN Take 1 tablet (100 mcg total) by mouth daily.   Vitamin D3 1000 units Caps Take by mouth.   zolpidem 6.25 MG CR tablet Commonly known as:  AMBIEN CR Take 2 tablets (12.5 mg total) by mouth at bedtime as needed for sleep.       No results found for this or any previous visit (from the past 24 hour(s)). No results found.   ROS: Negative, with the exception of above mentioned in HPI  Objective:  BP 114/80 (BP Location: Right Arm, Patient Position: Sitting, Cuff Size: Normal)   Pulse 99   Temp 98 F (36.7 C) (Oral)   Wt 121 lb 6.4 oz  (55.1 kg)   SpO2 98%   BMI 20.20 kg/m  Body mass index is 20.2 kg/m. Gen: Afebrile. No acute distress. Nontoxic in appearance, well-developed, well-nourished, Caucasian female. HENT: AT. [place].  MMM.  Eyes:Pupils Equal Round Reactive to light, Extraocular movements intact,  Conjunctiva without redness, discharge or icterus. CV: RRR  Neuro:  Normal gait. PERLA. EOMi. Alert. Oriented x3  Psych: Moderately anxious. Appears sad today. Otherwise Normal affect, dress and demeanor. Normal speech. Normal thought content and judgment.   Assessment/Plan: Dorette L Lerette is a 31 y.o. female present for acute OV for  Generalized anxiety disorder Depression with anxiety Panic disorder Sleep disorder - Discussed multiple options with patient today. She is still uncertain if she has enough time to seek counseling, but agrees she would find benefit in going. She will discuss with her employer and see if they are willing to make time for her to attend counseling, if so she will call in and I will place a referral again for her. - Increase Effexor to 150 mg daily. Refilled 75 mg dose, with instructions to take 2 tabs daily. Decided to refill and 75 mg dose in the case she decides 150 mg is too much for her. She has preservation's on higher dose, but would like to try. - Refills on Klonopin 0.5 mg twice a day when necessary. Patient does not use this medication often. Refilled and 90 day supply in hopes that this will help correct pharmacy issues. Refilled Ambien in a 90 day  supply. Both have refills as well for a total of 6 months supply. - Follow-up in 6 months unless needed sooner.   * flu shot administered.   electronically signed by:  Felix Pacinienee Twinkle Sockwell, DO  [facility] [place]

## 2017-01-29 NOTE — Patient Instructions (Signed)
effexor 150 mg a day (which is now 2 tabs). If you desire to return to the 75 mg (1 tab) daily for any reason you can (or take 150 3x week 75 mg other days.Marland Kitchen.etc) I have refilled your klonopin and ambien as well. I tried the 90 day supply to help you with pharmacy issues. There are refills on each as well--> total 6 months.    If you decide on going ahead with counseling, I will place the referral for you anytime, just call in.   I hope you have great Holidays!!!

## 2017-02-13 DIAGNOSIS — Z3042 Encounter for surveillance of injectable contraceptive: Secondary | ICD-10-CM | POA: Diagnosis not present

## 2017-02-13 DIAGNOSIS — Z6820 Body mass index (BMI) 20.0-20.9, adult: Secondary | ICD-10-CM | POA: Diagnosis not present

## 2017-02-13 DIAGNOSIS — Z01419 Encounter for gynecological examination (general) (routine) without abnormal findings: Secondary | ICD-10-CM | POA: Diagnosis not present

## 2017-02-13 DIAGNOSIS — Z124 Encounter for screening for malignant neoplasm of cervix: Secondary | ICD-10-CM | POA: Diagnosis not present

## 2017-02-14 LAB — HM PAP SMEAR

## 2017-02-17 ENCOUNTER — Encounter: Payer: Self-pay | Admitting: *Deleted

## 2017-03-04 DIAGNOSIS — Z30430 Encounter for insertion of intrauterine contraceptive device: Secondary | ICD-10-CM | POA: Diagnosis not present

## 2017-03-25 IMAGING — CT CT HEAD W/O CM
1 series · 16 of 30 positions shown, 20 images · non-contrast
Comparison: 01/24/2008 CT

CLINICAL DATA: 29-year-old female with acute left-sided headache
today.

EXAM:
CT HEAD WITHOUT CONTRAST
TECHNIQUE: Contiguous axial images were obtained from the base of the skull
through the vertex without intravenous contrast.

[Series 2: head wo · axial · 0.42mm/px · z∈[-173,-38]mm · 16 of 33 slices shown, 20 images]
[im 2/33  brain]
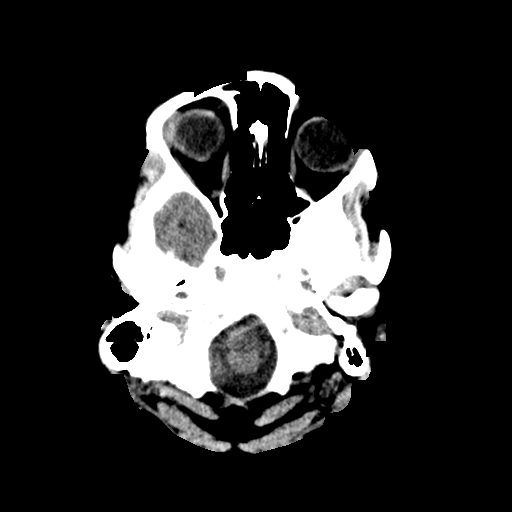
[im 2/33  bone]
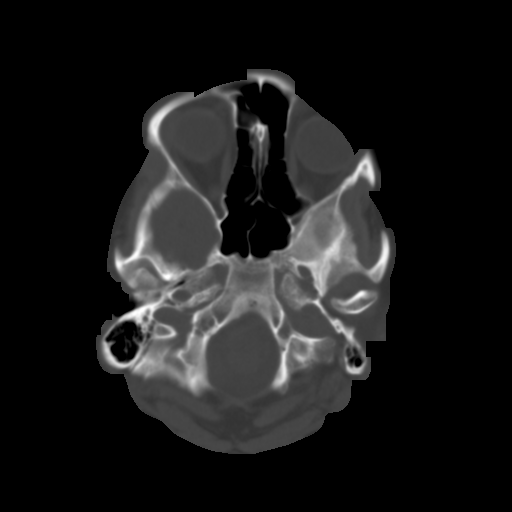
[im 4/33  brain]
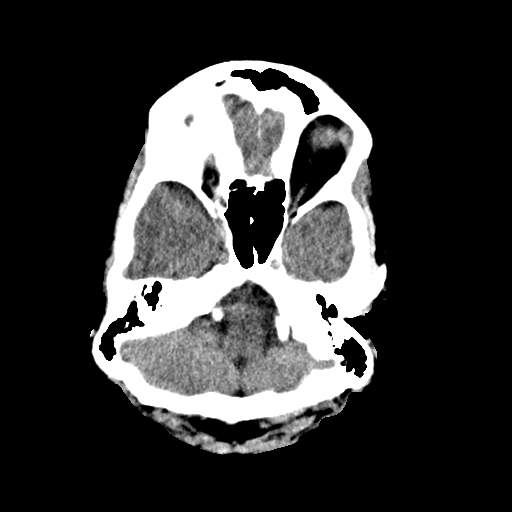
[im 6/33  brain]
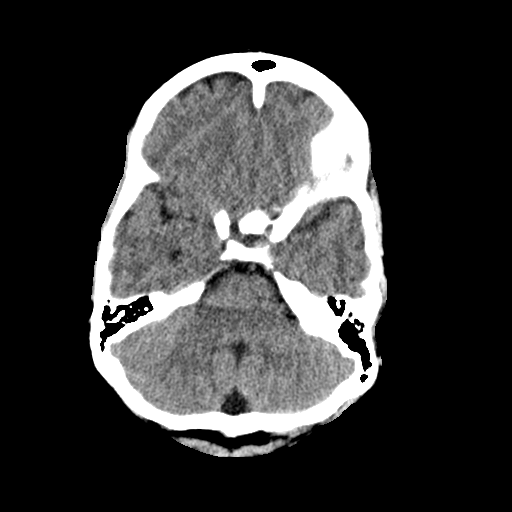
[im 8/33  brain]
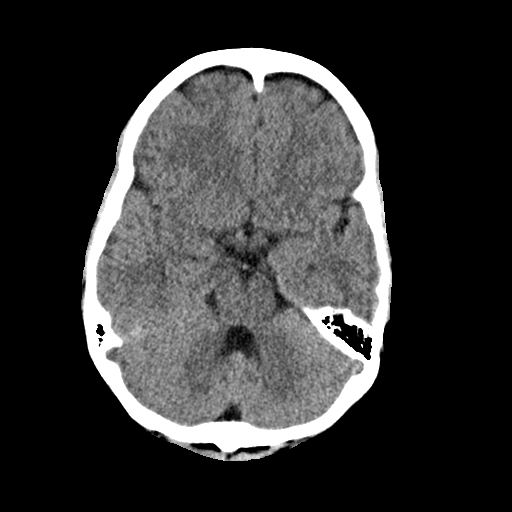
[im 9/33  brain]
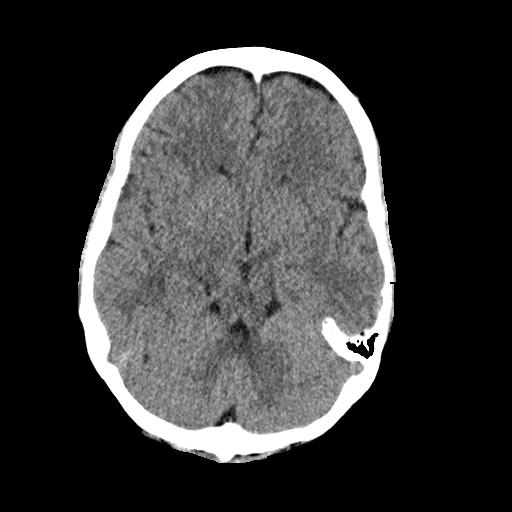
[im 9/33  bone]
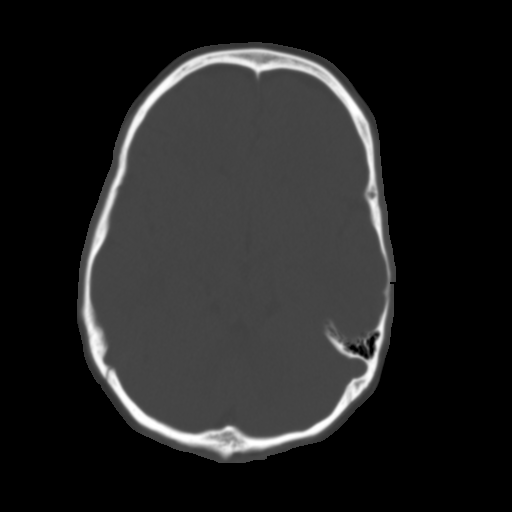
[im 12/33  brain]
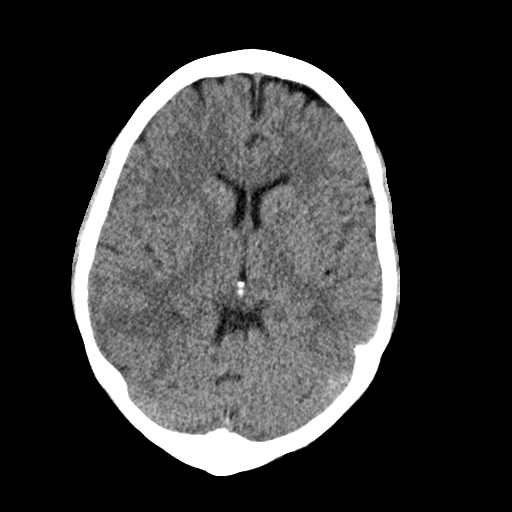
[im 14/33  brain]
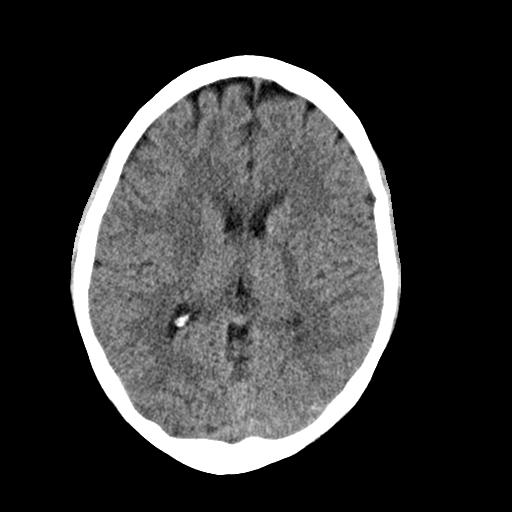
[im 16/33  brain]
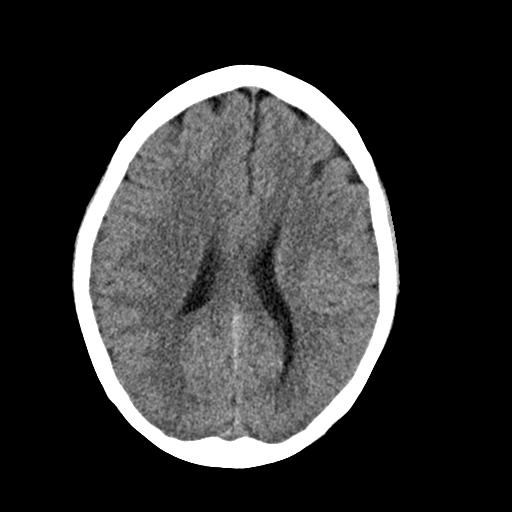
[im 17/33  brain]
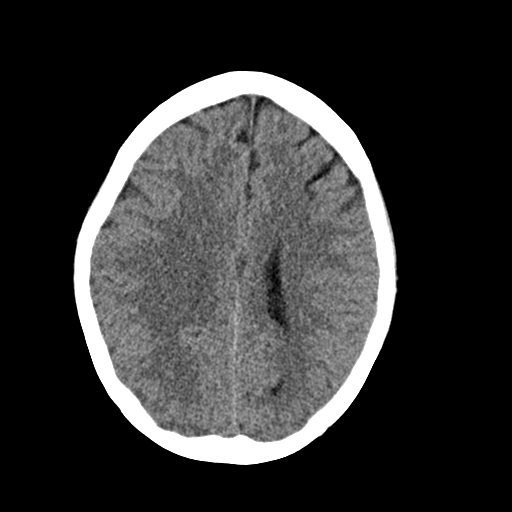
[im 17/33  bone]
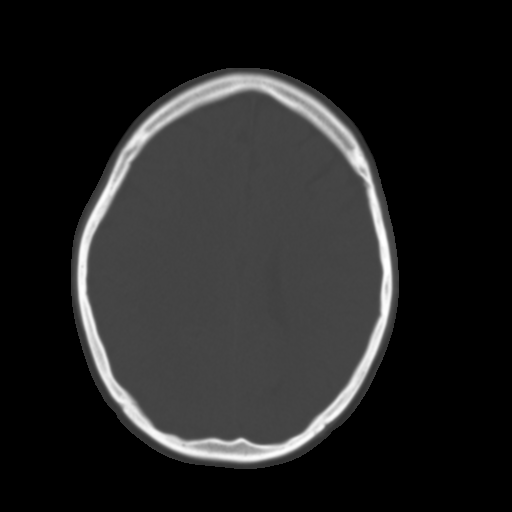
[im 19/33  brain]
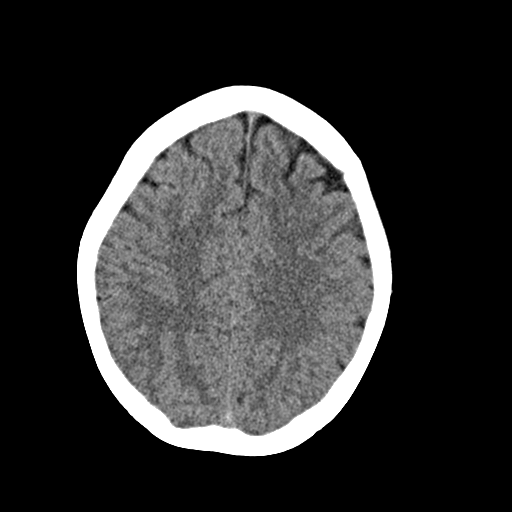
[im 21/33  brain]
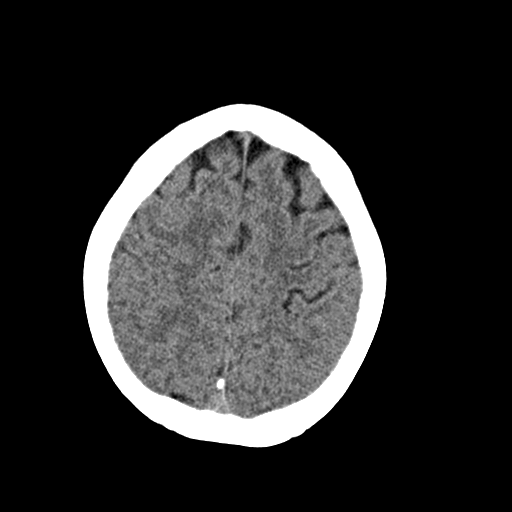
[im 24/33  brain]
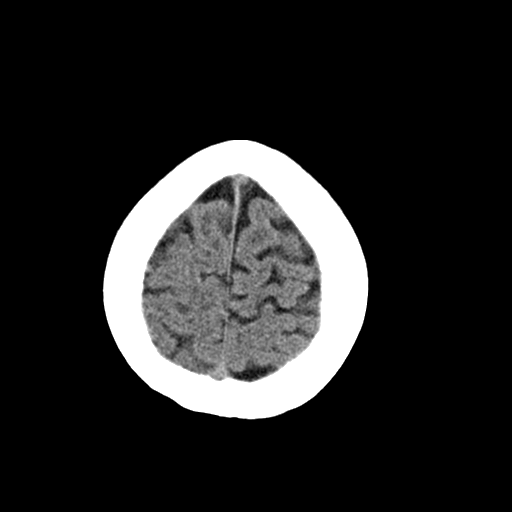
[im 25/33  brain]
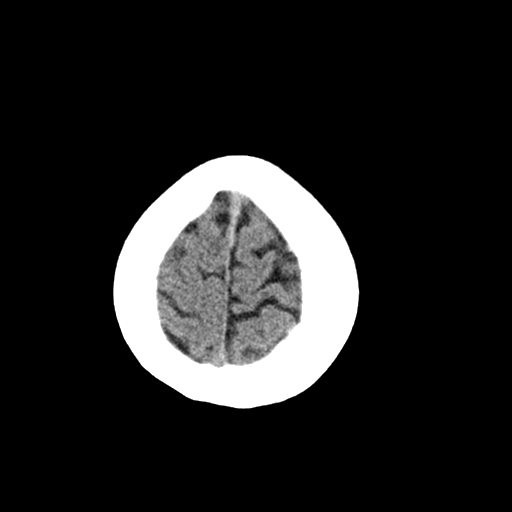
[im 25/33  bone]
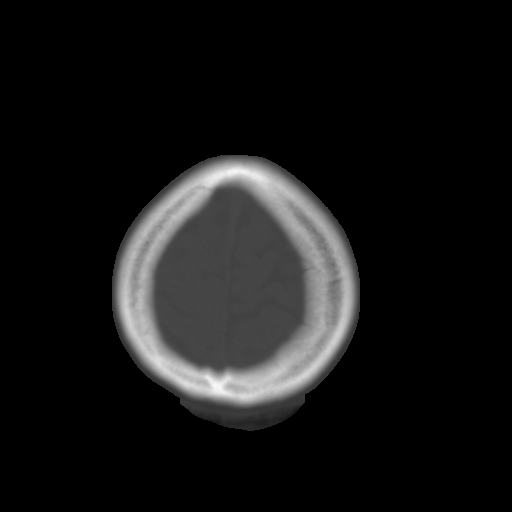
[im 27/33  brain]
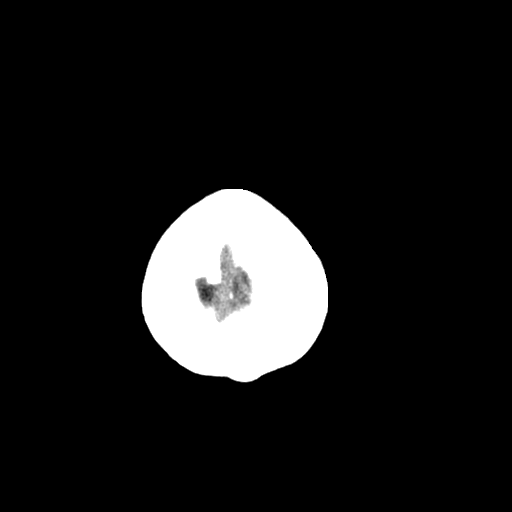
[im 29/33  brain]
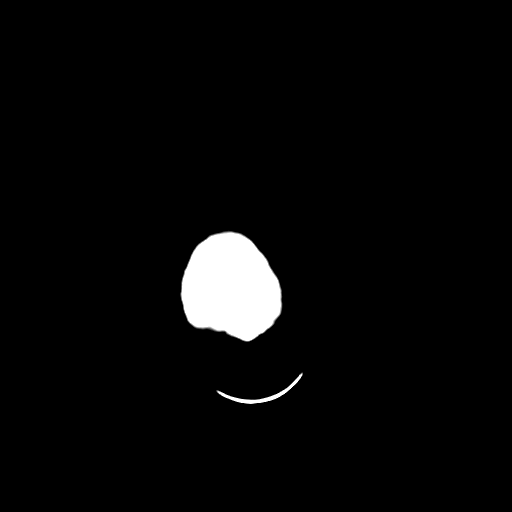
[im 31/33  brain]
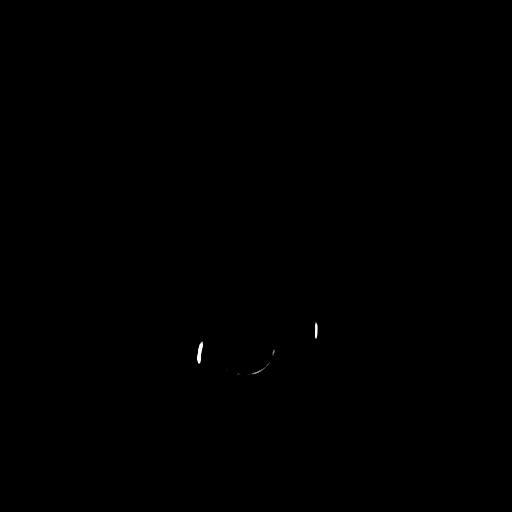

[16 of 30 positions shown; findings below may reference images not displayed]

FINDINGS: No intracranial abnormalities are identified, including mass lesion
or mass effect, hydrocephalus, extra-axial fluid collection, midline
shift, hemorrhage, or acute infarction.

The visualized bony calvarium is unremarkable.
IMPRESSION: Unremarkable noncontrast head CT.

## 2017-04-01 DIAGNOSIS — F4321 Adjustment disorder with depressed mood: Secondary | ICD-10-CM | POA: Diagnosis not present

## 2017-04-10 DIAGNOSIS — F4321 Adjustment disorder with depressed mood: Secondary | ICD-10-CM | POA: Diagnosis not present

## 2017-04-16 DIAGNOSIS — F4321 Adjustment disorder with depressed mood: Secondary | ICD-10-CM | POA: Diagnosis not present

## 2017-04-30 DIAGNOSIS — F4321 Adjustment disorder with depressed mood: Secondary | ICD-10-CM | POA: Diagnosis not present

## 2017-05-08 DIAGNOSIS — F4321 Adjustment disorder with depressed mood: Secondary | ICD-10-CM | POA: Diagnosis not present

## 2017-05-12 DIAGNOSIS — R0789 Other chest pain: Secondary | ICD-10-CM | POA: Diagnosis not present

## 2017-05-21 DIAGNOSIS — F4321 Adjustment disorder with depressed mood: Secondary | ICD-10-CM | POA: Diagnosis not present

## 2017-05-24 ENCOUNTER — Encounter (HOSPITAL_COMMUNITY): Payer: Self-pay

## 2017-05-24 ENCOUNTER — Other Ambulatory Visit: Payer: Self-pay

## 2017-05-24 ENCOUNTER — Emergency Department (HOSPITAL_COMMUNITY)
Admission: EM | Admit: 2017-05-24 | Discharge: 2017-05-26 | Disposition: A | Discharge status: Home / Self Care | Payer: BLUE CROSS/BLUE SHIELD | Attending: Emergency Medicine | Admitting: Emergency Medicine

## 2017-05-24 DIAGNOSIS — T50904A Poisoning by unspecified drugs, medicaments and biological substances, undetermined, initial encounter: Secondary | ICD-10-CM | POA: Diagnosis not present

## 2017-05-24 DIAGNOSIS — F1014 Alcohol abuse with alcohol-induced mood disorder: Secondary | ICD-10-CM | POA: Diagnosis not present

## 2017-05-24 DIAGNOSIS — R000 Tachycardia, unspecified: Secondary | ICD-10-CM | POA: Diagnosis not present

## 2017-05-24 DIAGNOSIS — Z87820 Personal history of traumatic brain injury: Secondary | ICD-10-CM | POA: Insufficient documentation

## 2017-05-24 DIAGNOSIS — T481X2A Poisoning by skeletal muscle relaxants [neuromuscular blocking agents], intentional self-harm, initial encounter: Secondary | ICD-10-CM | POA: Diagnosis not present

## 2017-05-24 DIAGNOSIS — Z811 Family history of alcohol abuse and dependence: Secondary | ICD-10-CM | POA: Diagnosis not present

## 2017-05-24 DIAGNOSIS — Z87891 Personal history of nicotine dependence: Secondary | ICD-10-CM | POA: Diagnosis not present

## 2017-05-24 DIAGNOSIS — Z79899 Other long term (current) drug therapy: Secondary | ICD-10-CM | POA: Insufficient documentation

## 2017-05-24 DIAGNOSIS — Y907 Blood alcohol level of 200-239 mg/100 ml: Secondary | ICD-10-CM | POA: Diagnosis not present

## 2017-05-24 DIAGNOSIS — T50902A Poisoning by unspecified drugs, medicaments and biological substances, intentional self-harm, initial encounter: Secondary | ICD-10-CM | POA: Insufficient documentation

## 2017-05-24 DIAGNOSIS — F10121 Alcohol abuse with intoxication delirium: Secondary | ICD-10-CM | POA: Diagnosis not present

## 2017-05-24 DIAGNOSIS — Z818 Family history of other mental and behavioral disorders: Secondary | ICD-10-CM | POA: Diagnosis not present

## 2017-05-24 DIAGNOSIS — F410 Panic disorder [episodic paroxysmal anxiety] without agoraphobia: Secondary | ICD-10-CM | POA: Diagnosis present

## 2017-05-24 LAB — COMPREHENSIVE METABOLIC PANEL
ALT: 29 U/L (ref 14–54)
AST: 24 U/L (ref 15–41)
Albumin: 4.1 g/dL (ref 3.5–5.0)
Alkaline Phosphatase: 77 U/L (ref 38–126)
Anion gap: 12 (ref 5–15)
BUN: 8 mg/dL (ref 6–20)
CO2: 23 mmol/L (ref 22–32)
Calcium: 8.8 mg/dL — ABNORMAL LOW (ref 8.9–10.3)
Chloride: 109 mmol/L (ref 101–111)
Creatinine, Ser: 0.61 mg/dL (ref 0.44–1.00)
GFR calc Af Amer: >60 mL/min (ref >60)
GFR calc non Af Amer: >60 mL/min (ref >60)
Glucose, Bld: 96 mg/dL (ref 65–99)
Potassium: 3.8 mmol/L (ref 3.5–5.1)
Sodium: 144 mmol/L (ref 135–145)
Total Bilirubin: 0.8 mg/dL (ref 0.3–1.2)
Total Protein: 8.4 g/dL — ABNORMAL HIGH (ref 6.5–8.1)

## 2017-05-24 LAB — RAPID URINE DRUG SCREEN, HOSP PERFORMED
Amphetamines: NOT DETECTED
Barbiturates: NOT DETECTED
Benzodiazepines: NOT DETECTED
Cocaine: NOT DETECTED
Opiates: POSITIVE — AB
Tetrahydrocannabinol: NOT DETECTED

## 2017-05-24 LAB — I-STAT BETA HCG BLOOD, ED (MC, WL, AP ONLY): I-stat hCG, quantitative: <5 m[IU]/mL (ref <5)

## 2017-05-24 LAB — CBC
HCT: 42 % (ref 36.0–46.0)
Hemoglobin: 14.1 g/dL (ref 12.0–15.0)
MCH: 31.5 pg (ref 26.0–34.0)
MCHC: 33.6 g/dL (ref 30.0–36.0)
MCV: 94 fL (ref 78.0–100.0)
Platelets: 280 10*3/uL (ref 150–400)
RBC: 4.47 MIL/uL (ref 3.87–5.11)
RDW: 12.9 % (ref 11.5–15.5)
WBC: 9.5 10*3/uL (ref 4.0–10.5)

## 2017-05-24 LAB — CBG MONITORING, ED: Glucose-Capillary: 88 mg/dL (ref 65–99)

## 2017-05-24 LAB — ACETAMINOPHEN LEVEL: Acetaminophen (Tylenol), Serum: <10 ug/mL — ABNORMAL LOW (ref 10–30)

## 2017-05-24 LAB — ETHANOL: Alcohol, Ethyl (B): 212 mg/dL — ABNORMAL HIGH (ref <10)

## 2017-05-24 LAB — SALICYLATE LEVEL: Salicylate Lvl: <7 mg/dL (ref 2.8–30.0)

## 2017-05-24 MED ORDER — SODIUM CHLORIDE 0.9 % IV BOLUS
1000.0000 mL | Freq: Once | INTRAVENOUS | Status: AC
  Administered 2017-05-24: 1000 mL via INTRAVENOUS

## 2017-05-24 MED ORDER — METOCLOPRAMIDE HCL 5 MG/ML IJ SOLN
10.0000 mg | Freq: Once | INTRAMUSCULAR | Status: AC
  Administered 2017-05-24: 10 mg via INTRAVENOUS
  Filled 2017-05-24: qty 2

## 2017-05-24 MED ORDER — ONDANSETRON HCL 4 MG/2ML IJ SOLN
4.0000 mg | Freq: Once | INTRAMUSCULAR | Status: AC
  Administered 2017-05-24: 4 mg via INTRAVENOUS
  Filled 2017-05-24: qty 2

## 2017-05-24 MED ORDER — KETOROLAC TROMETHAMINE 30 MG/ML IJ SOLN
30.0000 mg | Freq: Once | INTRAMUSCULAR | Status: AC
  Administered 2017-05-24: 30 mg via INTRAVENOUS
  Filled 2017-05-24: qty 1

## 2017-05-24 NOTE — ED Notes (Signed)
She c/o "I have a migraine". She c/o pain at right fronto-parietal area, which she states is typical for her migraines. She also c/o some nausea, the Zofran notwithstanding. Orders rec'd. From Dr. Fayrene FearingJames.

## 2017-05-24 NOTE — ED Notes (Signed)
She is now awake, alert and in no distress. Her mother remains with her. Dr. Lucilla EdinMacuen tells Terri [name] pt[facility]

## 2017-05-24 NOTE — ED Provider Notes (Addendum)
Patient is a 32 year old female with history of depression anxiety.  Patient took an unknown number of zolpidem, clonazepam and baclofen.  Opiate positive urine.  Patient's been sleeping since arrival in the ED.  Signed out to me to reevaluate at noon because the patient is eligble for psychiatric consult.  Patient is still deeply somnolent, protecting airway.  Given we have already observed for 7 hours in the emergency department, I think patient will likely require admission for clearing before being seen by psychiatry.  3:26 PM Patient seen hospitlist at around noon.  She woke up so much for him that the thought was to have her just be seen by psychiatry here given there is no medical reason for admission.  Unfortunately patient has gone back to sleep and been too sleepy for TTS consult.  We will continue to wait arousal for TTS consult.  Have visited patient multiple times today and talked with family.    Abelino DerrickMackuen, Courteney Lyn, MD 05/24/17 1209    Abelino DerrickMackuen, Courteney Lyn, MD 05/24/17 1528

## 2017-05-24 NOTE — ED Notes (Signed)
This nurse in pt room, Pt eyes closed,  Pt opens eyes quickly  and responds "Yes" and smiles when this nurse says her name, pt then closes eyes shortly after. Safety sitter present.

## 2017-05-24 NOTE — BH Assessment (Signed)
[facility] Assessment Progress Note  Case was staffed with Parks FNP who recommended patient be monitored and observed for safety. Patient will be seen by psychiatry in the a.m.      

## 2017-05-24 NOTE — ED Notes (Signed)
Bed: [id number] Expected date:  Expected time:  Means of arrival:  Comments: EMS intoxicated overdose

## 2017-05-24 NOTE — BH Assessment (Addendum)
Assessment Note  Terri [name] is an 32 y.o. female that presents this date after ingesting a unknown amount of medications and consuming alcohol. Patient's BAL was 212 on admission. Patient's UDS was positive for opiates. Patient continues to be actively impaired at the time of assessment but does give consent for this writer to gather collateral information from her mother who is present Tamra Koos [phone]. Patient denies the incident was a suicide attempt but cannot verbalize her reasoning associated with why she ingested the medications. Patient is observed to be actively impaired and patient's mother has to continuously shake patient in order for patient to answer questions. 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 Mother reports that injury was significant and patient was hospitalized for over one month prior to being discharged from the [employer]. Patient continues to have chronic pain and per mother was diagnosed with depression and GAD soon after patient returned home in 2011. Patient was initially diagnosed by a provider (patient cannot recall MD's name) though [facility] where she continues to receive medications for current health issues and depression/anxiety. Patient denies any previous attempts/gestures at self harm or having any other OP providers aside from [facility]. Patient's mother reports patient had been out at a night club last night and was stopped by law enforcement prior to entering her vehicle and was informed that she was to impaired to drive home. Patient arrived home by [place] and went to her downstairs apartment. Patient's mother reports she soon received a phone call from one of patient's friends who stated she just received a text from patient stating she was "really drunk, took some pills" and "she could not cope." Patient's mother went  downstairs to find patient unresponsive. Patient's mother indicated there were pill bottles that still had "some" medications in each bottle although mother is unsure how many patient had taken. Patient cannot recall the amount of medication she took also. Patient renders limited history due to current impaired state and cannot recall "most" of the evening or how she arrived home. Patient does deny H/I or AVH. Patient could not participate in the later part of the assessment due to being drowsy. Patient did shake her head "no" when asked if she was trying to end her life earlier this date. Per notes, patient arrives by [facility] with complaints of drug overdose and intoxication. Patient had been at [employer] drinking "[employer]e [employer] and [employer]" heavily and found a ride home. Patient went into her mother's residence and texted friend that she took unknown amounts Baclofen, Zolpidem, and Clonazepam. Patient is altered only saying "because". She reportedly had been drinking heavily this morning and then went home and took an unknown number of medications. The friend called 911 and EMS found her severely intoxicated with rambling, nonsensical speech. Case was staffed with Arville Care FNP who recommended patient be monitored and observed for safety. Patient will be seen by psychiatry in the a.m.    Diagnosis: F33.2 MDD recurrent severe without psychotic features, GAD, Alcohol use  Past Medical History:  Past Medical History:  Diagnosis Date  . Allergy   . Brain injury [facility]) 32 yrs old   fell off babysitters cabinet, and on a separate occasion she fell down a flight of stairs  . Closed head injury About age 4 yrs   ICH per pt, with seizure disorder for appox 2 yrs afterward.  No significant  residual disability.  . Depression with anxiety    Failed buspar.  Did well on citalopram.  . Drug overdose 2004   Xanax  . H/O varicella   . History of chlamydia   . HSV infection    Genital clx pos HSV 2--2007.  Marland Kitchen Hx of migraines     chronic migraines (Dr. Everlena Cooper) 03/2015  . Hx: UTI (urinary tract infection) 09/06/10  . IBS (irritable bowel syndrome)   . Right hip pain 05/2009   MRI 10/12/09: Femoral head and neck stress injury plus small acetabular labral tear.  [facility] ortho (Dr. Caswell Corwin) as of 11/2012.  Preferred pain mgmt: steroid injection into right subtrochanteric bursa 12/06/15.  . Seizure [facility])    for 2 yrs after closed head injury/ICH    Past Surgical History:  Procedure Laterality Date  . HIP SURGERY  10/10/2010   Right hip GT bursectomy and osteophytectomy for intractable right greater troch pain (Dr. Despina Hick).  [facility] second opinion 06/2012--resulted in repeat MRI, then right hip IA injection that gave mild short term response, then was referred to PT with iontophoresis (as of 09/07/12)--was told to f/u in 6 wks.  Marland Kitchen MOLE REMOVAL     on back  . WISDOM TOOTH EXTRACTION      Family History:  Family History  Problem Relation Age of Onset  . Breast cancer Mother 3  . Thyroid cancer Mother   . Hepatitis Father        Hep C  . Alcohol abuse Father   . Thyroid disease Sister   . Anxiety disorder Sister   . Thyroid cancer Sister   . Thrombophlebitis Maternal Grandfather   . Anxiety disorder Maternal Grandfather   . Heart disease Maternal Grandfather   . Alzheimer's disease Paternal Grandmother     Social History:  reports that she quit smoking about 10 years ago. Her smoking use included cigarettes. She smoked 0.30 packs per day. She has never used smokeless tobacco. She reports that she drinks alcohol. She reports that she does not use drugs.  Additional Social History:  Alcohol / Drug Use Pain Medications: See MAR Prescriptions: See MAR Over the Counter: See MAR History of alcohol / drug use?: Yes Longest period of sobriety (when/how long): Unknown Negative Consequences of Use: (denies) Withdrawal Symptoms: (denies) Substance #1 Name of Substance 1: Alcohol 1 - Age of First Use: 18 1 - Amount (size/oz): Pt  reports "beer and liquor" 1 - Frequency: Pt states 2 to 3 times a week 1 - Duration: Last year 1 - Last Use / Amount: 05/23/17 Pt reports "alot" BAL 212  CIWA: CIWA-Ar BP: 126/90 Pulse Rate: 78 COWS:    Allergies:  Allergies  Allergen Reactions  . Penicillins Rash    Home Medications:  (Not in a hospital admission)  OB/GYN Status:  No LMP recorded. Patient has had an injection.  General Assessment Data [facility] of Assessment: [facility] TTS Assessment: In system Is this a Tele or Face-to-Face Assessment?: Face-to-Face Is this an Initial Assessment or a Re-assessment for this encounter?: Initial Assessment Marital status: Single Maiden name: NA Is patient pregnant?: No Pregnancy Status: No Living Arrangements: Parent Can pt return to current living arrangement?: Yes Admission Status: Voluntary Is patient capable of signing voluntary admission?: Yes Referral Source: Self/Family/Friend Insurance type: Tax adviser Exam [facility] Walk-in ONLY) Medical Exam completed: Yes  Crisis Care Plan Living Arrangements: Parent Legal Guardian: (NA) Name of Psychiatrist: None Name of Therapist: None  Education Status Is patient  currently in school?: No Is the patient employed, unemployed or receiving disability?: Employed  Risk to self with the past 6 months Suicidal Ideation: Yes-Currently Present Has patient been a risk to self within the past 6 months prior to admission? : No Suicidal Intent: No Has patient had any suicidal intent within the past 6 months prior to admission? : No Is patient at risk for suicide?: Yes Suicidal Plan?: No Has patient had any suicidal plan within the past 6 months prior to admission? : No Access to Means: Yes Specify Access to Suicidal Means: Pt had medications What has been your use of drugs/alcohol within the last 12 months?: Current use Previous Attempts/Gestures: No How many times?: 0 Other Self Harm Risks: (Excessive SA  use) Triggers for Past Attempts: (NA) Intentional Self Injurious Behavior: None Family Suicide History: No Recent stressful life event(s): (Increased pain management) Persecutory voices/beliefs?: No Depression: Yes Depression Symptoms: (Pt cannot identify symptoms) Substance abuse history and/or treatment for substance abuse?: No Suicide prevention information given to non-admitted patients: Not applicable  Risk to Others within the past 6 months Homicidal Ideation: No Does patient have any lifetime risk of violence toward others beyond the six months prior to admission? : No Thoughts of Harm to Others: No Current Homicidal Intent: No Current Homicidal Plan: No Access to Homicidal Means: No Identified Victim: NA History of harm to others?: No Assessment of Violence: None Noted Violent Behavior Description: NA Does patient have access to weapons?: No Criminal Charges Pending?: No Does patient have a court date: No Is patient on probation?: No  Psychosis Hallucinations: None noted Delusions: None noted  Mental Status Report Appearance/Hygiene: In hospital gown Eye Contact: Unable to Assess Motor Activity: Unremarkable Speech: Slurred Level of Consciousness: Drowsy Mood: Other (Comment)(UTA) Affect: Unable to Assess Anxiety Level: (UTA) Thought Processes: (UTA) Judgement: (UTA) Orientation: (UTA) Obsessive Compulsive Thoughts/Behaviors: (UTA)  Cognitive Functioning Concentration: (UTA) Memory: (UTA) Is patient IDD: No Is patient DD?: No Insight: (UTA) Impulse Control: (UTA) Appetite: (UTA) Have you had any weight changes? : (UTA) Sleep: (UTA) Total Hours of Sleep: (UTA) Vegetative Symptoms: (UTA)  ADLScreening [facility] Assessment Services) Patient's cognitive ability adequate to safely complete daily activities?: Yes Patient able to express need for assistance with ADLs?: Yes Independently performs ADLs?: Yes (appropriate for developmental age)  Prior Inpatient  Therapy Prior Inpatient Therapy: No  Prior Outpatient Therapy Prior Outpatient Therapy: Yes Prior Therapy Dates: Ongoing Prior Therapy Facilty/Provider(s): [facility] Reason for Treatment: Anxiety, Depression Does patient have an ACCT team?: No Does patient have Intensive In-House Services?  : No Does patient have [facility] services? : No Does patient have P4CC services?: No  ADL Screening (condition at time of admission) Patient's cognitive ability adequate to safely complete daily activities?: Yes Is the patient deaf or have difficulty hearing?: No Does the patient have difficulty seeing, even when wearing glasses/contacts?: No Does the patient have difficulty concentrating, remembering, or making decisions?: No Patient able to express need for assistance with ADLs?: Yes Does the patient have difficulty dressing or bathing?: No Independently performs ADLs?: Yes (appropriate for developmental age) Does the patient have difficulty walking or climbing stairs?: No Weakness of Legs: None Weakness of Arms/Hands: None  Home Assistive Devices/Equipment Home Assistive Devices/Equipment: None  Therapy Consults (therapy consults require a physician order) PT Evaluation Needed: No OT Evalulation Needed: No SLP Evaluation Needed: No Abuse/Neglect Assessment (Assessment to be complete while patient is alone) Physical Abuse: Denies Verbal Abuse: Denies Sexual Abuse: Denies Exploitation of patient/patient's  resources: Denies Self-Neglect: Denies Values / Beliefs Cultural Requests During Hospitalization: None Spiritual Requests During Hospitalization: None Consults Spiritual Care Consult Needed: No Social Work Consult Needed: No Merchant navy officerAdvance Directives (For Healthcare) Does Patient Have a Medical Advance Directive?: No Would patient like information on creating a medical advance directive?: No - Patient declined    Additional Information 1:1 In Past 12 Months?: No CIRT Risk:  No Elopement Risk: No Does patient have medical clearance?: No(Pt continues to be monitored by poison control )     Disposition: Case was staffed with Arville CareParks FNP who recommended patient be monitored and observed for safety. Patient will be seen by psychiatry in the a.m.    Disposition Initial Assessment Completed for this Encounter: Yes Disposition of Patient: (pt will be observed and monitored for safety) Patient refused recommended treatment: No Mode of transportation if patient is discharged?: Car  On Site Evaluation by:   Reviewed with Physician:    Alfredia Fergusonavid L Venie Montesinos 05/24/2017 5:59 PM

## 2017-05-24 NOTE — ED Notes (Signed)
One  pt belonging bag locked in locker #32.

## 2017-05-24 NOTE — ED Notes (Signed)
Poison control called and advised that pt is cleared as long as she is at her baseline. They also advise that they are closing her case.

## 2017-05-24 NOTE — ED Provider Notes (Signed)
Pt seen and evaluated. Discussed with Dr. Corlis LeakMackuen. Care assumed. Pt awake and alert. C/o "Migraine Headache". Normal exam. States MHA c/w prior. Given toradol and reglan. Noted improvement. Awaiting TTS. Stable for TCU/SAPU.   Terri [name], Terri Purington, MD 05/24/17 [name]

## 2017-05-24 NOTE — ED Provider Notes (Signed)
[facility]ENCY DEPT Provider Note: Terri Dell, MD, FACEP  CSN: [id number] MRN: [id number] ARRIVAL: 05/24/17 at 0533 ROOM: WA14/WA14   CHIEF COMPLAINT  Drug Overdose  Level 5 caveat: Intoxicated; altered mental status HISTORY OF PRESENT ILLNESS  05/24/17 5:46 AM Terri [name] is a 32 y.o. female with a history of depression and anxiety.  She reportedly had been drinking heavily this morning Terri [name] and Coke") and then went home and took an unknown number of zolpidem, clonazepam and baclofen tablets.  She texted a friend that she had taking these because "sometimes you just cannot cope".  The friend called 911 and EMS found her severely intoxicated with rambling, nonsensical speech.  She appeared to have a panic attack just prior to arrival and became tachycardic into the 140s with hyperventilation.   Past Medical History:  Diagnosis Date  . Allergy   . Brain injury [facility] 32 yrs old   fell off babysitters cabinet, and on a separate occasion she fell down a flight of stairs  . Closed head injury About age 89 yrs   ICH per pt, with seizure disorder for appox 2 yrs afterward.  No significant residual disability.  . Depression with anxiety    Failed buspar.  Did well on citalopram.  . Drug overdose 2004   Xanax  . H/O varicella   . History of chlamydia   . HSV infection    Genital clx pos HSV 2--2007.  Marland Kitchen Hx of migraines    chronic migraines (Dr. Everlena Cooper) 03/2015  . Hx: UTI (urinary tract infection) 09/06/10  . IBS (irritable bowel syndrome)   . Right hip pain 05/2009   MRI 10/12/09: Femoral head and neck stress injury plus small acetabular labral tear.  [facility] ortho (Dr. Caswell Corwin) as of 11/2012.  Preferred pain mgmt: steroid injection into right subtrochanteric bursa 12/06/15.  . Seizure [facility])    for 2 yrs after closed head injury/ICH    Past Surgical History:  Procedure Laterality Date  . HIP SURGERY  10/10/2010   Right hip GT bursectomy and osteophytectomy for intractable right  greater troch pain (Dr. Despina Hick).  [facility] second opinion 06/2012--resulted in repeat MRI, then right hip IA injection that gave mild short term response, then was referred to PT with iontophoresis (as of 09/07/12)--was told to f/u in 6 wks.  Marland Kitchen MOLE REMOVAL     on back  . WISDOM TOOTH EXTRACTION      Family History  Problem Relation Age of Onset  . Breast cancer Mother 67  . Thyroid cancer Mother   . Hepatitis Father        Hep C  . Alcohol abuse Father   . Thyroid disease Sister   . Anxiety disorder Sister   . Thyroid cancer Sister   . Thrombophlebitis Maternal Grandfather   . Anxiety disorder Maternal Grandfather   . Heart disease Maternal Grandfather   . Alzheimer's disease Paternal Grandmother     Social History   Tobacco Use  . Smoking status: Former Smoker    Packs/day: 0.30    Types: Cigarettes    Last attempt to quit: 02/12/2007    Years since quitting: 10.2  . Smokeless tobacco: Never Used  Substance Use Topics  . Alcohol use: Yes    Comment: occasional  . Drug use: No    Prior to Admission medications   Medication Sig Start Date End Date Taking? Authorizing Provider  albuterol (PROVENTIL HFA;VENTOLIN HFA) 108 (90 Base) MCG/ACT inhaler Inhale 2 puffs into the  lungs every 6 (six) hours as needed for wheezing or shortness of breath. 05/30/15   Kuneff, Renee A, DO  baclofen (LIORESAL) 10 MG tablet TAKE 1 TABLET BY MOUTH 2 TIMES DAILY AS NEEDED 02/17/15   [provider]  Cholecalciferol (VITAMIN D3) 1000 units CAPS Take by mouth.    [provider]  clonazePAM (KLONOPIN) 0.5 MG tablet Take 1 tablet (0.5 mg total) by mouth 2 (two) times daily as needed for anxiety. 01/29/17   Kuneff, Renee A, DO  MedroxyPROGESTERone Acetate (DEPO-PROVERA IM) Inject into the muscle every 3 (three) months.      [provider]  morphine (MSIR) 15 MG tablet Take 15 mg by mouth every 8 (eight) hours as needed for severe pain.    [provider]  ondansetron  (ZOFRAN) 4 MG tablet Take 1 tablet (4 mg total) by mouth every 8 (eight) hours as needed for nausea or vomiting. 10/25/16   Kuneff, Renee A, DO  rizatriptan (MAXALT) 10 MG tablet May repeat in 2 hours if needed--max of 30mg  in 24h period. 12/20/15   Kuneff, Renee A, DO  valACYclovir (VALTREX) 1000 MG tablet Take 1 tablet (1,000 mg total) by mouth daily. 04/12/16   Kuneff, Renee A, DO  venlafaxine XR (EFFEXOR-XR) 75 MG 24 hr capsule Take 2 capsules (150 mg total) by mouth daily with breakfast. 01/29/17   Kuneff, Renee A, DO  vitamin B-12 (CYANOCOBALAMIN) 100 MCG tablet Take 1 tablet (100 mcg total) by mouth daily. 01/25/15   Kuneff, Renee A, DO  zolpidem (AMBIEN CR) 6.25 MG CR tablet Take 2 tablets (12.5 mg total) by mouth at bedtime as needed for sleep. 01/29/17   Kuneff, Renee A, DO  amitriptyline (ELAVIL) 25 MG tablet Take 25 mg daily for 7 days, then increase to 50 mg daily for 7 days, then may increase to 75 mg daily. 01/25/15 02/12/15  Kuneff, Renee A, DO    Allergies Penicillins   REVIEW OF SYSTEMS     PHYSICAL EXAMINATION  Initial Vital Signs Blood pressure 118/81, pulse (!) 109, temperature (!) 97.4 F (36.3 C), resp. rate (!) 22, SpO2 100 %.  Examination General: Well-developed, well-nourished female in no acute distress; appearance consistent with age of record HENT: normocephalic; atraumatic; breath smells of alcohol Eyes: pupils equal, round and reactive to light; extraocular muscles intact Neck: supple Heart: regular rate and rhythm; tachycardia Lungs: clear to auscultation bilaterally; tachypnea Abdomen: soft; nondistended; nontender; no masses or hepatosplenomegaly; bowel sounds present Extremities: No deformity; full range of motion; pulses normal Neurologic: Awake, alert, dysarthric speech consisting of nonsensical, often repetitive, statements and answers to questions; motor function intact in all extremities and symmetric; no facial droop Skin: Warm and dry Psychiatric:  Intoxicated; sometimes admits to SI, sometimes denies it   RESULTS  Summary of this visit's results, reviewed by myself:   EKG Interpretation  Date/Time:  Saturday May 24 2017 05:46:41 EDT Ventricular Rate:  106 PR Interval:    QRS Duration: 79 QT Interval:  342 QTC Calculation: 455 R Axis:   84 Text Interpretation:  Sinus tachycardia Otherwise normal ECG No previous ECGs available Confirmed by Masami Plata ([id number]) on 05/24/2017 5:51:42 AM      Laboratory Studies: Results for orders placed or performed during the hospital encounter of 05/24/17 (from the past 24 hour(s))  Comprehensive metabolic panel     Status: Abnormal   Collection Time: 05/24/17  5:55 AM  Result Value Ref Range   Sodium 144 135 - 145 mmol/L  Potassium 3.8 3.5 - 5.1 mmol/L   Chloride 109 101 - 111 mmol/L   CO2 23 22 - 32 mmol/L   Glucose, Bld 96 65 - 99 mg/dL   BUN 8 6 - 20 mg/dL   Creatinine, Ser 9.14 0.44 - 1.00 mg/dL   Calcium 8.8 (L) 8.9 - 10.3 mg/dL   Total Protein 8.4 (H) 6.5 - 8.1 g/dL   Albumin 4.1 3.5 - 5.0 g/dL   AST 24 15 - 41 U/L   ALT 29 14 - 54 U/L   Alkaline Phosphatase 77 38 - 126 U/L   Total Bilirubin 0.8 0.3 - 1.2 mg/dL   GFR calc non Af Amer >60 >60 mL/min   GFR calc Af Amer >60 >60 mL/min   Anion gap 12 5 - 15  Ethanol     Status: Abnormal   Collection Time: 05/24/17  5:55 AM  Result Value Ref Range   Alcohol, Ethyl (B) 212 (H) <10 mg/dL  Salicylate level     Status: None   Collection Time: 05/24/17  5:55 AM  Result Value Ref Range   Salicylate Lvl <7.0 2.8 - 30.0 mg/dL  Acetaminophen level     Status: Abnormal   Collection Time: 05/24/17  5:55 AM  Result Value Ref Range   Acetaminophen (Tylenol), Serum <10 (L) 10 - 30 ug/mL  cbc     Status: None   Collection Time: 05/24/17  5:55 AM  Result Value Ref Range   WBC 9.5 4.0 - 10.5 K/uL   RBC 4.47 3.87 - 5.11 MIL/uL   Hemoglobin 14.1 12.0 - 15.0 g/dL   HCT 78.2 95.6 - 21.3 %   MCV 94.0 78.0 - 100.0 fL   MCH 31.5 26.0 -  34.0 pg   MCHC 33.6 30.0 - 36.0 g/dL   RDW 08.6 57.8 - 46.9 %   Platelets 280 150 - 400 K/uL  CBG monitoring, ED     Status: None   Collection Time: 05/24/17  5:57 AM  Result Value Ref Range   Glucose-Capillary 88 65 - 99 mg/dL  I-Stat beta hCG blood, ED     Status: None   Collection Time: 05/24/17  6:05 AM  Result Value Ref Range   I-stat hCG, quantitative <5.0 <5 mIU/mL   Comment 3          Rapid urine drug screen (hospital performed)     Status: Abnormal   Collection Time: 05/24/17  7:25 AM  Result Value Ref Range   Opiates POSITIVE (A) NONE DETECTED   Cocaine NONE DETECTED NONE DETECTED   Benzodiazepines NONE DETECTED NONE DETECTED   Amphetamines NONE DETECTED NONE DETECTED   Tetrahydrocannabinol NONE DETECTED NONE DETECTED   Barbiturates NONE DETECTED NONE DETECTED   Imaging Studies: No results found.  ED COURSE  Nursing notes and initial vitals signs, including pulse oximetry, reviewed.  Vitals:   05/24/17 0533 05/24/17 0547 05/24/17 0600 05/24/17 0630  BP:  118/81 121/71 97/62  Pulse:  (!) 109 (!) 109 93  Resp:  (!) 22 18 17   Temp:  (!) 97.4 F (36.3 C)    SpO2: 100% 100% 100% 96%   6:08 AM Patient attempting to elope.  IVC papers filled out.  Will observe and if patient declines we will have her admitted medically otherwise we will have TTS assess her when sober.  PROCEDURES    ED DIAGNOSES     ICD-10-CM   1. Intentional drug overdose, initial encounter (HCC) T50.902A   2. Alcohol intoxication  delirium (HCC) F10.121        Bill Mcvey, Jonny Ruiz, MD 05/24/17 [phone]

## 2017-05-24 NOTE — ED Notes (Signed)
It was reported by my colleagues that pt. Had had an episode of emesis, at which time she became quite coherent, asking them how she "ended up here". She was astonished at my colleagues reply that she had drank a lot and reportedly took "some pills". She remains in no distress.

## 2017-05-24 NOTE — ED Notes (Signed)
Spoke with [facility]: -Baclofen: -coma May lose reflexes and may be mistaken for brain death Patient may become flaccid Myoclonic jerks Hypothermia Seizures Prolonged QTc -Zolpidem CNS depression -Give fluids -cardiac monitor -minimal OBS for 6 hours-until noon or back to baseline -Clonazepam resp depression

## 2017-05-24 NOTE — ED Notes (Signed)
Pt to room #31, limited assessment d/t pt is drowsy at this time.  Mother at bedside. Sitter present. Encouragement and support provided. Will continue to monitor.

## 2017-05-24 NOTE — ED Notes (Signed)
Her mother and sister are visiting. Pt. Coughs a few times; sounds congested. We boost her up in her bed and sit her up, with resolution of cough; with SPO2 consistently 100%.

## 2017-05-24 NOTE — ED Triage Notes (Signed)
[facility] with complaints of drug overdose and intoxication. Patient had been at [employer] drinking jack and cokes heavily-found a ride home-went into the house and her Mother let her in-patient texted friend that she took unknown amounts baclofen, zolpidem, and clonazepam. Patient is altered only saying "because".

## 2017-05-24 NOTE — ED Notes (Signed)
SBAR Report received from previous nurse. Pt received resting but wakes with verbal stimuli on unit. Pt unable to stay awake for nursing assessment. Pt reminded of camera surveillance, q 15 min rounds, and rules of the milieu. Will continue to assess.

## 2017-05-25 DIAGNOSIS — Z811 Family history of alcohol abuse and dependence: Secondary | ICD-10-CM | POA: Diagnosis not present

## 2017-05-25 DIAGNOSIS — Z810 Family history of intellectual disabilities: Secondary | ICD-10-CM | POA: Diagnosis not present

## 2017-05-25 DIAGNOSIS — R4587 Impulsiveness: Secondary | ICD-10-CM

## 2017-05-25 DIAGNOSIS — Z87891 Personal history of nicotine dependence: Secondary | ICD-10-CM

## 2017-05-25 DIAGNOSIS — Z818 Family history of other mental and behavioral disorders: Secondary | ICD-10-CM | POA: Diagnosis not present

## 2017-05-25 DIAGNOSIS — Y907 Blood alcohol level of 200-239 mg/100 ml: Secondary | ICD-10-CM

## 2017-05-25 DIAGNOSIS — F1014 Alcohol abuse with alcohol-induced mood disorder: Secondary | ICD-10-CM | POA: Diagnosis not present

## 2017-05-25 MED ORDER — HYDROXYZINE HCL 25 MG PO TABS
25.0000 mg | ORAL_TABLET | Freq: Three times a day (TID) | ORAL | Status: DC | PRN
  Administered 2017-05-25: 25 mg via ORAL
  Filled 2017-05-25: qty 1

## 2017-05-25 MED ORDER — GABAPENTIN 100 MG PO CAPS
200.0000 mg | ORAL_CAPSULE | Freq: Two times a day (BID) | ORAL | Status: DC
  Administered 2017-05-25 – 2017-05-26 (×3): 200 mg via ORAL
  Filled 2017-05-25 (×3): qty 2

## 2017-05-25 MED ORDER — VENLAFAXINE HCL ER 75 MG PO CP24
150.0000 mg | ORAL_CAPSULE | Freq: Every day | ORAL | Status: DC
Start: 2017-05-26 — End: 2017-05-26
  Administered 2017-05-26: 150 mg via ORAL
  Filled 2017-05-25: qty 2

## 2017-05-25 NOTE — ED Notes (Signed)
Patient admitted on unit. Patient stated "I feel better". Denies pain, SI/HI, AH/VH at this time. Contracts for safety. Verbalized no concern. No behavior issues noted. Will continue to monitor patient. Safety checks maintained.

## 2017-05-25 NOTE — ED Notes (Signed)
Up, eating, cooperative.

## 2017-05-25 NOTE — Consult Note (Addendum)
[facility] Face-to-Face Psychiatry Consult   Reason for Consult:  Drug overdose while intoxicated Referring Physician:  EDP Patient Identification: Terri [name] MRN:  [id number] Principal Diagnosis: Alcohol abuse with alcohol-induced mood disorder [facility]) Diagnosis:   Patient Active Problem List   Diagnosis Date Noted  . Alcohol abuse with alcohol-induced mood disorder ([place]) [F10.14] 05/25/2017  . Sleep disorder [G47.9] 09/18/2016  . Genital HSV [A60.00] 08/24/2015  . Encounter for preventive health examination [Z00.00] 05/30/2015  . History of traumatic brain injury [Z87.820] 05/30/2015  . Chronic narcotic use [F11.90] 05/30/2015  . Vitamin D deficiency [E55.9] 05/30/2015  . Migraine [G43.909] 01/24/2015  . Allergic rhinitis [J30.9] 12/01/2014  . IBS (irritable bowel syndrome) [K58.9] 10/10/2014  . Acute myofascial pain [M79.18] 05/09/2014  . Trigger point of right side of body [M79.10] 05/09/2014  . Contraceptive management [Z30.9] 12/17/2011  . Panic disorder [F41.0] 07/05/2010  . Generalized anxiety disorder [F41.1] 07/05/2010  . Depression with anxiety [F41.8] 07/05/2010  . Right hip pain [M25.551] 07/05/2010    Total Time spent with patient: 45 minutes  Subjective:   Terri [name] is a 32 y.o. female patient admitted with drug overdose while intoxicated.  HPI:  Pt was seen and chart reviewed with treatment team and Dr Darleene Cleaver. Pt denies suicidal/homicidal ideation, denies auditory/visual hallucinations and does not appear to be responding to internal stimuli. Pt stated she drank to the point of blacking out, which she has only done two other times in her life. Pt stated she remembers having the pills in her hand but was not trying to harm herself. Pt stated she has old prescriptions of opiates and muscle relaxers and this is what she took along with Klonopin and Ambien. Pt's UDS positive for opiates, BAL 212 on admission.  Pt was so intoxicated she had to take an [place] home as [employer]  saw her attempting to get in her car and told she was too intoxicated to drive. Pt stated she lives with her parents and her son and is safe at home. Pt will remain overnight in the emergency room for safety and will have her labs rechecked on Monday. If labs look good she will most likely be discharged.   Past Psychiatric History: As above  Risk to Self: None Risk to Others:  None Prior Inpatient Therapy: Prior Inpatient Therapy: No Prior Outpatient Therapy: Prior Outpatient Therapy: Yes Prior Therapy Dates: Ongoing Prior Therapy Facilty/Provider(s): [facility] Reason for Treatment: Anxiety, Depression Does patient have an ACCT team?: No Does patient have Intensive In-House Services?  : No Does patient have [facility] services? : No Does patient have P4CC services?: No  Past Medical History:  Past Medical History:  Diagnosis Date  . Allergy   . Brain injury [facility]) 32 yrs old   fell off babysitters cabinet, and on a separate occasion she fell down a flight of stairs  . Closed head injury About age 76 yrs   ICH per pt, with seizure disorder for appox 2 yrs afterward.  No significant residual disability.  . Depression with anxiety    Failed buspar.  Did well on citalopram.  . Drug overdose 2004   Xanax  . H/O varicella   . History of chlamydia   . HSV infection    Genital clx pos HSV 2--2007.  Marland Kitchen Hx of migraines    chronic migraines (Dr. Tomi Likens) 03/2015  . Hx: UTI (urinary tract infection) 09/06/10  . IBS (irritable bowel syndrome)   . Right hip pain 05/2009  MRI 10/12/09: Femoral head and neck stress injury plus small acetabular labral tear.  [facility] ortho (Dr. Aretha Parrot) as of 11/2012.  Preferred pain mgmt: steroid injection into right subtrochanteric bursa 12/06/15.  . Seizure [facility])    for 2 yrs after closed head injury/ICH    Past Surgical History:  Procedure Laterality Date  . HIP SURGERY  10/10/2010   Right hip GT bursectomy and osteophytectomy for intractable right greater  troch pain (Dr. Maureen Ralphs).  [facility] second opinion 06/2012--resulted in repeat MRI, then right hip IA injection that gave mild short term response, then was referred to PT with iontophoresis (as of 09/07/12)--was told to f/u in 6 wks.  Marland Kitchen MOLE REMOVAL     on back  . WISDOM TOOTH EXTRACTION     Family History:  Family History  Problem Relation Age of Onset  . Breast cancer Mother 71  . Thyroid cancer Mother   . Hepatitis Father        Hep C  . Alcohol abuse Father   . Thyroid disease Sister   . Anxiety disorder Sister   . Thyroid cancer Sister   . Thrombophlebitis Maternal Grandfather   . Anxiety disorder Maternal Grandfather   . Heart disease Maternal Grandfather   . Alzheimer's disease Paternal Grandmother    Family Psychiatric  History: Unknown Social History:  Social History   Substance and Sexual Activity  Alcohol Use Yes   Comment: occasional     Social History   Substance and Sexual Activity  Drug Use No    Social History   Socioeconomic History  . Marital status: Single    Spouse name: Not on file  . Number of children: 1  . Years of education: Not on file  . Highest education level: Not on file  Occupational History  . Not on file  Social Needs  . Financial resource strain: Not on file  . Food insecurity:    Worry: Not on file    Inability: Not on file  . Transportation needs:    Medical: Not on file    Non-medical: Not on file  Tobacco Use  . Smoking status: Former Smoker    Packs/day: 0.30    Types: Cigarettes    Last attempt to quit: 02/12/2007    Years since quitting: 10.2  . Smokeless tobacco: Never Used  Substance and Sexual Activity  . Alcohol use: Yes    Comment: occasional  . Drug use: No  . Sexual activity: Yes    Partners: Male    Birth control/protection: Implant  Lifestyle  . Physical activity:    Days per week: Not on file    Minutes per session: Not on file  . Stress: Not on file  Relationships  . Social connections:    Talks on  phone: Not on file    Gets together: Not on file    Attends religious service: Not on file    Active member of club or organization: Not on file    Attends meetings of clubs or organizations: Not on file    Relationship status: Not on file  Other Topics Concern  . Not on file  Social History Narrative   Single.     Lives in [place] with her mom and dad and her 55 y/o son Jeannetta Ellis.   Works as a Biochemist, clinical with mental/developmental disabilities.   Describes somewhat wild teenage years, "made some bad choices and hung out with the wrong people".  However, after that  she had no issues with drugs or alcohol.   Reports that she had a second child a couple of years after her son Jeannetta Ellis (2008) and she gave him/her up for adoption right after birth.                 Additional Social History:    Allergies:   Allergies  Allergen Reactions  . Penicillins Hives and Rash    Has patient had a PCN reaction causing immediate rash, facial/tongue/throat swelling, SOB or lightheadedness with hypotension: Yes Has patient had a PCN reaction causing severe rash involving mucus membranes or skin necrosis: No Has patient had a PCN reaction that required hospitalization: No Has patient had a PCN reaction occurring within the last 10 years: No If all of the above answers are "NO", then may proceed with Cephalosporin use.;     Labs:  Results for orders placed or performed during the hospital encounter of 05/24/17 (from the past 48 hour(s))  Comprehensive metabolic panel     Status: Abnormal   Collection Time: 05/24/17  5:55 AM  Result Value Ref Range   Sodium 144 135 - 145 mmol/L   Potassium 3.8 3.5 - 5.1 mmol/L   Chloride 109 101 - 111 mmol/L   CO2 23 22 - 32 mmol/L   Glucose, Bld 96 65 - 99 mg/dL   BUN 8 6 - 20 mg/dL   Creatinine, Ser 0.61 0.44 - 1.00 mg/dL   Calcium 8.8 (L) 8.9 - 10.3 mg/dL   Total Protein 8.4 (H) 6.5 - 8.1 g/dL   Albumin 4.1 3.5 - 5.0 g/dL   AST 24 15 - 41  U/L   ALT 29 14 - 54 U/L   Alkaline Phosphatase 77 38 - 126 U/L   Total Bilirubin 0.8 0.3 - 1.2 mg/dL   GFR calc non Af Amer >60 >60 mL/min   GFR calc Af Amer >60 >60 mL/min    Comment: (NOTE) The eGFR has been calculated using the CKD EPI equation. This calculation has not been validated in all clinical situations. eGFR's persistently <60 mL/min signify possible Chronic Kidney Disease.    Anion gap 12 5 - 15    Comment: Performed at [facility], [place] [address], [place], [place] [address]  Ethanol     Status: Abnormal   Collection Time: 05/24/17  5:55 AM  Result Value Ref Range   Alcohol, Ethyl (B) 212 (H) <10 mg/dL    Comment:        LOWEST DETECTABLE LIMIT FOR SERUM ALCOHOL IS 10 mg/dL FOR MEDICAL PURPOSES ONLY Performed at [place] [address], [place], [place] [address]   Salicylate level     Status: None   Collection Time: 05/24/17  5:55 AM  Result Value Ref Range   Salicylate Lvl <5.9 2.8 - 30.0 mg/dL    Comment: Performed at [facility], [place] [address] [place], [place] [address]  Acetaminophen level     Status: Abnormal   Collection Time: 05/24/17  5:55 AM  Result Value Ref Range   Acetaminophen (Tylenol), Serum <10 (L) 10 - 30 ug/mL    Comment:        THERAPEUTIC CONCENTRATIONS VARY SIGNIFICANTLY. A RANGE OF 10-30 ug/mL MAY BE AN EFFECTIVE CONCENTRATION FOR MANY PATIENTS. HOWEVER, SOME ARE BEST TREATED AT CONCENTRATIONS OUTSIDE THIS RANGE. ACETAMINOPHEN CONCENTRATIONS >150 ug/mL AT 4 HOURS AFTER INGESTION AND >50 ug/mL AT 12 HOURS AFTER INGESTION ARE OFTEN ASSOCIATED WITH TOXIC REACTIONS. Performed  at [facility], [place] [address], [place], [place] [address]   cbc     Status: None   Collection Time: 05/24/17  5:55 AM  Result Value Ref Range   WBC 9.5 4.0 - 10.5 K/uL   RBC 4.47 3.87 - 5.11 MIL/uL   Hemoglobin 14.1 12.0 - 15.0 g/dL   HCT 42.0 36.0 - 46.0 %   MCV 94.0  78.0 - 100.0 fL   MCH 31.5 26.0 - 34.0 pg   MCHC 33.6 30.0 - 36.0 g/dL   RDW 12.9 11.5 - 15.5 %   Platelets 280 150 - 400 K/uL    Comment: Performed at [facility], [place] [address] [place], [place] [address]  CBG monitoring, ED     Status: None   Collection Time: 05/24/17  5:57 AM  Result Value Ref Range   Glucose-Capillary 88 65 - 99 mg/dL  I-Stat beta hCG blood, ED     Status: None   Collection Time: 05/24/17  6:05 AM  Result Value Ref Range   I-stat hCG, quantitative <5.0 <5 mIU/mL   Comment 3            Comment:   GEST. AGE      CONC.  (mIU/mL)   <=1 WEEK        5 - 50     2 WEEKS       50 - 500     3 WEEKS       100 - 10,000     4 WEEKS     1,000 - 30,000        FEMALE AND NON-PREGNANT FEMALE:     LESS THAN 5 mIU/mL   Rapid urine drug screen (hospital performed)     Status: Abnormal   Collection Time: 05/24/17  7:25 AM  Result Value Ref Range   Opiates POSITIVE (A) NONE DETECTED   Cocaine NONE DETECTED NONE DETECTED   Benzodiazepines NONE DETECTED NONE DETECTED   Amphetamines NONE DETECTED NONE DETECTED   Tetrahydrocannabinol NONE DETECTED NONE DETECTED   Barbiturates NONE DETECTED NONE DETECTED    Comment: (NOTE) DRUG SCREEN FOR MEDICAL PURPOSES ONLY.  IF CONFIRMATION IS NEEDED FOR ANY PURPOSE, NOTIFY LAB WITHIN 5 DAYS. LOWEST DETECTABLE LIMITS FOR URINE DRUG SCREEN Drug Class                     Cutoff (ng/mL) Amphetamine and metabolites    1000 Barbiturate and metabolites    200 Benzodiazepine                 016 Tricyclics and metabolites     300 Opiates and metabolites        300 Cocaine and metabolites        300 THC                            50 Performed at [facility], [place] [address] [place], [place] [address]     Current Facility-Administered Medications  Medication Dose Route Frequency Provider Last Rate Last Dose  . gabapentin (NEURONTIN) capsule 200 mg  200 mg Oral BID Mariem Skolnick, MD   200 mg at  05/25/17 1338  . hydrOXYzine (ATARAX/VISTARIL) tablet 25 mg  25 mg Oral TID PRN Corena Pilgrim, MD      . Derrill Memo ON 05/26/2017] venlafaxine XR (EFFEXOR-XR) 24 hr capsule 150 mg  150 mg Oral Q breakfast Aalyiah Camberos,  Eesa Justiss, MD       Current Outpatient Medications  Medication Sig Dispense Refill  . albuterol (PROVENTIL HFA;VENTOLIN HFA) 108 (90 Base) MCG/ACT inhaler Inhale 2 puffs into the lungs every 6 (six) hours as needed for wheezing or shortness of breath. 1 Inhaler 0  . baclofen (LIORESAL) 10 MG tablet Take 20 mg by mouth 2 (two) times daily as needed for muscle spasms.    . clonazePAM (KLONOPIN) 0.5 MG tablet Take 1 tablet (0.5 mg total) by mouth 2 (two) times daily as needed for anxiety. 90 tablet 1  . naproxen sodium (ANAPROX) 550 MG tablet Take 550 mg by mouth 2 (two) times daily.  0  . venlafaxine XR (EFFEXOR-XR) 75 MG 24 hr capsule Take 2 capsules (150 mg total) by mouth daily with breakfast. 180 capsule 1  . zolpidem (AMBIEN CR) 6.25 MG CR tablet Take 2 tablets (12.5 mg total) by mouth at bedtime as needed for sleep. 90 tablet 1  . rizatriptan (MAXALT) 10 MG tablet May repeat in 2 hours if needed--max of 1m in 24h period. (Patient not taking: Reported on 05/25/2017) 10 tablet 2  . valACYclovir (VALTREX) 1000 MG tablet Take 1 tablet (1,000 mg total) by mouth daily. (Patient not taking: Reported on 05/25/2017) 30 tablet 5  . vitamin B-12 (CYANOCOBALAMIN) 100 MCG tablet Take 1 tablet (100 mcg total) by mouth daily. (Patient not taking: Reported on 05/25/2017) 60 tablet 0    Musculoskeletal: Strength & Muscle Tone: within normal limits Gait & Station: normal Patient leans: N/A  Psychiatric Specialty Exam: Physical Exam  Constitutional: She is oriented to person, place, and time. She appears well-developed and well-nourished.  HENT:  Head: Normocephalic.  Respiratory: Effort normal.  Musculoskeletal: Normal range of motion.  Neurological: She is alert and oriented to person, place,  and time.  Psychiatric: She has a normal mood and affect. Her speech is normal and behavior is normal. Thought content normal. Cognition and memory are normal. She expresses impulsivity.    Review of Systems  Psychiatric/Behavioral: Positive for depression and substance abuse. Negative for hallucinations, memory loss and suicidal ideas. The patient is not nervous/anxious and does not have insomnia.   All other systems reviewed and are negative.   Blood pressure 114/78, pulse 80, temperature 98.4 F (36.9 C), temperature source Oral, resp. rate 18, SpO2 99 %.There is no height or weight on file to calculate BMI.  General Appearance: Casual  Eye Contact:  Good  Speech:  Clear and Coherent and Normal Rate  Volume:  Normal  Mood:  Euthymic  Affect:  Congruent  Thought Process:  Coherent, Goal Directed and Linear  Orientation:  Full (Time, Place, and Person)  Thought Content:  Logical  Suicidal Thoughts:  No  Homicidal Thoughts:  No  Memory:  Immediate;   Good Recent;   Good Remote;   Fair  Judgement:  Fair  Insight:  Fair  Psychomotor Activity:  Normal  Concentration:  Concentration: Good and Attention Span: Good  Recall:  Good  Fund of Knowledge:  Good  Language:  Good  Akathisia:  No  Handed:  Right  AIMS (if indicated):     Assets:  CResearch scientist (life sciences)Vocational/Educational  ADL's:  Intact  Cognition:  WNL  Sleep:        Treatment Plan Summary: [facility] and recheck liver enzymes on 05-26-17. Possible discharge on 05-26-17  Disposition: Observe in ED for safety and medication effectiveness  and recheck blood work on 05-26-17 for possible discharge on Monday.   Ethelene Hal, NP 05/25/2017 2:22 PM  Patient seen face-to-face for psychiatric evaluation, chart reviewed and case discussed with the physician extender and developed treatment plan. Reviewed the information  documented and agree with the treatment plan. Corena Pilgrim, MD

## 2017-05-25 NOTE — BH Assessment (Addendum)
[facility]    Per Dr Jannifer FranklinAkintayo, patient will need continued observation and monitoring for safety as well as withdrawal potential and will be re-evaluated on 05/26/17.

## 2017-05-26 NOTE — ED Notes (Signed)
Pt d/c home per MD order. Discharge summary reviewed with pt, pt verbalizes understanding. Pt denies SI/HI/AVH. Pt signed for personal property and property returned. Pt signed e-signature. Ambulatory off unit with MHT. Pt mother in lobby for d/c.

## 2017-05-26 NOTE — BHH Suicide Risk Assessment (Cosign Needed)
Suicide Risk Assessment  Discharge Assessment   [facility]   Principal Problem: Alcohol abuse with alcohol-induced mood disorder [facility](HCC) Discharge Diagnoses:  Patient Active Problem List   Diagnosis Date Noted  . Alcohol abuse with alcohol-induced mood disorder (HCC) [F10.14] 05/25/2017  . Sleep disorder [G47.9] 09/18/2016  . Genital HSV [A60.00] 08/24/2015  . Encounter for preventive health examination [Z00.00] 05/30/2015  . History of traumatic brain injury [Z87.820] 05/30/2015  . Chronic narcotic use [F11.90] 05/30/2015  . Vitamin D deficiency [E55.9] 05/30/2015  . Migraine [G43.909] 01/24/2015  . Allergic rhinitis [J30.9] 12/01/2014  . IBS (irritable bowel syndrome) [K58.9] 10/10/2014  . Acute myofascial pain [M79.18] 05/09/2014  . Trigger point of right side of body [M79.10] 05/09/2014  . Contraceptive management [Z30.9] 12/17/2011  . Panic disorder [F41.0] 07/05/2010  . Generalized anxiety disorder [F41.1] 07/05/2010  . Depression with anxiety [F41.8] 07/05/2010  . Right hip pain [M25.551] 07/05/2010    Total Time spent with patient: 30 minutes  Musculoskeletal: Strength & Muscle Tone: within normal limits Gait & Station: normal Patient leans: N/A  Psychiatric Specialty Exam: Physical Exam  Constitutional: She is oriented to person, place, and time. She appears well-developed and well-nourished.  HENT:  Head: Normocephalic.  Respiratory: Effort normal.  Musculoskeletal: Normal range of motion.  Neurological: She is alert and oriented to person, place, and time.  Psychiatric: She has a normal mood and affect. Her speech is normal and behavior is normal. Thought content normal. Cognition and memory are normal. She expresses impulsivity.   Review of Systems  Psychiatric/Behavioral: Positive for depression and substance abuse. Negative for hallucinations, memory loss and suicidal ideas. The patient is not nervous/anxious and does not have  insomnia.   All other systems reviewed and are negative.  Blood pressure 107/64, pulse (!) 55, temperature 97.6 F (36.4 C), resp. rate 16, SpO2 100 %.There is no height or weight on file to calculate BMI. General Appearance: Casual Eye Contact:  Good Speech:  Clear and Coherent and Normal Rate Volume:  Normal Mood:  Euthymic Affect:  Congruent Thought Process:  Coherent, Goal Directed and Linear Orientation:  Full (Time, Place, and Person) Thought Content:  Logical Suicidal Thoughts:  No Homicidal Thoughts:  No Memory:  Immediate;   Good Recent;   Good Remote;   Fair Judgement:  Fair Insight:  Fair Psychomotor Activity:  Normal Concentration:  Concentration: Good and Attention Span: Good Recall:  Good Fund of Knowledge:  Good Language:  Good Akathisia:  No Handed:  Right AIMS (if indicated):    Assets:  Administrator, artsCommunication Skills Financial Resources/Insurance Housing Social Support Transportation Vocational/Educational ADL's:  Intact Cognition:  WNL   Mental Status Per Nursing Assessment::   On Admission:   Unintentional overdose while intoxicated  Demographic Factors:  Caucasian  Loss Factors: Financial problems/change in socioeconomic status  Historical Factors: Impulsivity  Risk Reduction Factors:   Responsible for children under 32 years of age, Employed and Living with another person, especially a relative  Continued Clinical Symptoms:  Depression:   Impulsivity Alcohol/Substance Abuse/Dependencies  Cognitive Features That Contribute To Risk:  Closed-mindedness    Suicide Risk:  Minimal: No identifiable suicidal ideation.  Patients presenting with no risk factors but with morbid ruminations; may be classified as minimal risk based on the severity of the depressive symptoms    Plan Of Care/Follow-up recommendations:  Activity:  as tolerated Diet:  Heart Healthy  Laveda AbbeLaurie Britton Eilis Chestnutt, NP 05/26/2017, 11:37 AM

## 2017-05-26 NOTE — ED Notes (Signed)
Pt talking on hallway phone.  

## 2017-05-26 NOTE — BH Assessment (Signed)
[facility] Assessment Progress Note  Per Thedore MinsMojeed Akintayo, MD, this pt does not require psychiatric hospitalization at this time.  Pt presents under IVC initiated by EDP Meriel FlavorsJohn Lane Molpus, MD, which Dr Jannifer FranklinAkintayo has rescinded.  [facility]  No outpatient referrals are required.  Pt's nurse, Morrie Sheldonshley, has been notified.  Doylene Canninghomas Elizet Kaplan, MA Triage Specialist [phone]

## 2017-05-26 NOTE — Patient Outreach (Signed)
ED Peer Support Specialist Patient Intake (Complete at intake & 30-60 Day Follow-up)  Name: Terri [name]  MRN: [id number]  Age: 32 y.o.   Date of Admission: 05/26/2017  Intake: (P) Initial Comments:      Primary Reason Admitted: is an 32 y.o. female that presents this date after ingesting a unknown amount of medications and consuming alcohol. Patient's BAL was 212 on admission. Patient's UDS was positive for opiates. Patient continues to be actively impaired at the time of assessment but does give consent for this writer to gather collateral information from her mother who is present Terri [name] [phone]. Patient denies the incident was a suicide attempt but cannot verbalize her reasoning associated with why she ingested the medications. Patient is observed to be actively impaired and patient's mother has to continuously shake patient in order for patient to answer questions. 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 Mother reports that injury was significant and patient was hospitalized for over one month prior to being discharged from the [employer]. Patient continues to have chronic pain and per mother was diagnosed with depression and GAD soon after patient returned home in 2011. Patient was initially diagnosed by a provider (patient cannot recall MD's name) though [facility] where she continues to receive medications for current health issues and depression/anxiety. Patient denies any previous attempts/gestures at self harm or having any other OP providers aside from [facility]. Patient's mother reports patient had been out at a night club last night and was stopped by law enforcement prior to entering her vehicle and was informed that she was to impaired to drive home. Patient arrived home by [place] and went to her downstairs apartment. Patient's mother reports she soon  received a phone call from one of patient's friends who stated she just received a text from patient stating she was "really drunk, took some pills" and "she could not cope." Patient's mother went downstairs to find patient unresponsive. Patient's mother indicated there were pill bottles that still had "some" medications in each bottle although mother is unsure how many patient had taken. Patient cannot recall the amount of medication she took also. Patient renders limited history due to current impaired state and cannot recall "most" of the evening or how she arrived home. Patient does deny H/I or AVH. Patient could not participate in the later part of the assessment due to being drowsy. Patient did shake her head "no" when asked if she was trying to end her life earlier this date. Per notes, patient arrives by [facility] with complaints of drug overdose and intoxication. Patient had been at [place] drinking "[employer]" heavily and found a ride home. Patient went into her mother's residence and texted friend that she took unknown amounts Baclofen, Zolpidem, and Clonazepam. Patient is altered only saying "because". She reportedly had been drinking heavily this morning and then went home and took an unknown number of medications.The friend called 95 and EMS found her severely intoxicated with rambling, nonsensical speech. Case was staffed with Romilda Garret FNP who recommended patient be monitored and observed for safety. Patient will be seen by psychiatry in the a.m.      Lab values: Alcohol/ETOH: (P) Positive Positive UDS? (P) Yes Amphetamines: (P) No Barbiturates: (P) No Benzodiazepines: (P) No Cocaine: (P) No Opiates: (P) Yes Cannabinoids: (P) No  Demographic information: Gender: (P) Female Ethnicity: (P) White Marital Status: (  P) Single Insurance Status: (P) Diplomatic Services operational officer (Work Neurosurgeon, Physicist, medical, etc.: (P) No Lives with: (P)  Partner/Spouse Living situation: (P) House/Apartment  Reported Patient History: Patient reported health conditions: (P) Anxiety disorders, Depression Patient aware of HIV and hepatitis status: (P) No  In past year, has patient visited ED for any reason? (P) No  Number of ED visits:    Reason(s) for visit:    In past year, has patient been hospitalized for any reason? (P) No  Number of hospitalizations:    Reason(s) for hospitalization:    In past year, has patient been arrested? (P) No  Number of arrests:    Reason(s) for arrest:    In past year, has patient been incarcerated? (P) No  Number of incarcerations:    Reason(s) for incarceration:    In past year, has patient received medication-assisted treatment? (P) No  In past year, patient received the following treatments:    In past year, has patient received any harm reduction services? (P) No  Did this include any of the following?    In past year, has patient received care from a mental health provider for diagnosis other than SUD? (P) No  In past year, is this first time patient has overdosed? (P) No  Number of past overdoses:    In past year, is this first time patient has been hospitalized for an overdose? (P) No  Number of hospitalizations for overdose(s):    Is patient currently receiving treatment for a mental health diagnosis? (P) No  Patient reports experiencing difficulty participating in SUD treatment: (P) No    Most important reason(s) for this difficulty?    Has patient received prior services for treatment? (P) No  In past, patient has received services from following agencies:    Plan of Care:  Suggested follow up at these agencies/treatment centers:    Other information: CPSS met with Pt and was able to process with Pt about what brought her in and what she services she wanted to receive from the ER visit. CPSS was able to complete the series of questions and was able to use motivational  interviewing to help Pt understand several options that maybe of use for Pt. CPSS discussed the importance of Pt understand how serious the situation is that brought her in. CPSS was able to give Pt Outpatient Behavioral Health information for therapy. CPSS made Pt aware that it would be a good start for her [facility].    Aaron Edelman Vinie Charity, [place]  05/26/2017 11:42 AM

## 2017-05-26 NOTE — Consult Note (Addendum)
[facility] Face-to-Face Psychiatry Consult   Reason for Consult:  Drug overdose while intoxicated Referring Physician:  EDP Patient Identification: Terri [name] MRN:  [id number] Principal Diagnosis: Alcohol abuse with alcohol-induced mood disorder [facility]) Diagnosis:   Patient Active Problem List   Diagnosis Date Noted  . Alcohol abuse with alcohol-induced mood disorder (HCC) [F10.14] 05/25/2017  . Sleep disorder [G47.9] 09/18/2016  . Genital HSV [A60.00] 08/24/2015  . Encounter for preventive health examination [Z00.00] 05/30/2015  . History of traumatic brain injury [Z87.820] 05/30/2015  . Chronic narcotic use [F11.90] 05/30/2015  . Vitamin D deficiency [E55.9] 05/30/2015  . Migraine [G43.909] 01/24/2015  . Allergic rhinitis [J30.9] 12/01/2014  . IBS (irritable bowel syndrome) [K58.9] 10/10/2014  . Acute myofascial pain [M79.18] 05/09/2014  . Trigger point of right side of body [M79.10] 05/09/2014  . Contraceptive management [Z30.9] 12/17/2011  . Panic disorder [F41.0] 07/05/2010  . Generalized anxiety disorder [F41.1] 07/05/2010  . Depression with anxiety [F41.8] 07/05/2010  . Right hip pain [M25.551] 07/05/2010    Total Time spent with patient: 45 minutes  Subjective:   Terri [name] is a 32 y.o. female patient admitted with drug overdose while intoxicated.  HPI:  Pt was seen and chart reviewed with treatment team and Dr Jannifer Franklin. Pt denies suicidal/homicidal ideation, denies auditory/visual hallucinations and does not appear to be responding to internal stimuli. Pt stated she drank to the point of blacking out, which she has only done two other times in her life. Pt stated she remembers having the pills in her hand but was not trying to harm herself. Pt stated she has old prescriptions of opiates and muscle relaxers and this is what she took along with Klonopin and Ambien. Pt's UDS positive for opiates, BAL 212 on admission.  Pt stated she lives with her parents and her son and is  safe at home. Pt stated she feels much better today and is ready to discharge home and take care of her son. Pt was seen by Peer Support for substance abuse resources and has information about [facility] for therapy. Pt is psychiatrically clear for discharge.   Past Psychiatric History: As above  Risk to Self: None Risk to Others:  None Prior Inpatient Therapy: Prior Inpatient Therapy: No Prior Outpatient Therapy: Prior Outpatient Therapy: Yes Prior Therapy Dates: Ongoing Prior Therapy Facilty/Provider(s): [facility] Reason for Treatment: Anxiety, Depression Does patient have an ACCT team?: No Does patient have Intensive In-House Services?  : No Does patient have [facility] services? : No Does patient have P4CC services?: No  Past Medical History:  Past Medical History:  Diagnosis Date  . Allergy   . Brain injury [facility]) 32 yrs old   fell off babysitters cabinet, and on a separate occasion she fell down a flight of stairs  . Closed head injury About age 15 yrs   ICH per pt, with seizure disorder for appox 2 yrs afterward.  No significant residual disability.  . Depression with anxiety    Failed buspar.  Did well on citalopram.  . Drug overdose 2004   Xanax  . H/O varicella   . History of chlamydia   . HSV infection    Genital clx pos HSV 2--2007.  Marland Kitchen Hx of migraines    chronic migraines (Dr. Everlena Cooper) 03/2015  . Hx: UTI (urinary tract infection) 09/06/10  . IBS (irritable bowel syndrome)   . Right hip pain 05/2009   MRI 10/12/09: Femoral head and neck stress injury plus small acetabular  labral tear.  [facility] ortho (Dr. Caswell Corwin) as of 11/2012.  Preferred pain mgmt: steroid injection into right subtrochanteric bursa 12/06/15.  . Seizure [facility])    for 2 yrs after closed head injury/ICH    Past Surgical History:  Procedure Laterality Date  . HIP SURGERY  10/10/2010   Right hip GT bursectomy and osteophytectomy for intractable right greater troch pain (Dr. Despina Hick).   [facility] second opinion 06/2012--resulted in repeat MRI, then right hip IA injection that gave mild short term response, then was referred to PT with iontophoresis (as of 09/07/12)--was told to f/u in 6 wks.  Marland Kitchen MOLE REMOVAL     on back  . WISDOM TOOTH EXTRACTION     Family History:  Family History  Problem Relation Age of Onset  . Breast cancer Mother 55  . Thyroid cancer Mother   . Hepatitis Father        Hep C  . Alcohol abuse Father   . Thyroid disease Sister   . Anxiety disorder Sister   . Thyroid cancer Sister   . Thrombophlebitis Maternal Grandfather   . Anxiety disorder Maternal Grandfather   . Heart disease Maternal Grandfather   . Alzheimer's disease Paternal Grandmother    Family Psychiatric  History: Unknown Social History:  Social History   Substance and Sexual Activity  Alcohol Use Yes   Comment: occasional     Social History   Substance and Sexual Activity  Drug Use No    Social History   Socioeconomic History  . Marital status: Single    Spouse name: Not on file  . Number of children: 1  . Years of education: Not on file  . Highest education level: Not on file  Occupational History  . Not on file  Social Needs  . Financial resource strain: Not on file  . Food insecurity:    Worry: Not on file    Inability: Not on file  . Transportation needs:    Medical: Not on file    Non-medical: Not on file  Tobacco Use  . Smoking status: Former Smoker    Packs/day: 0.30    Types: Cigarettes    Last attempt to quit: 02/12/2007    Years since quitting: 10.2  . Smokeless tobacco: Never Used  Substance and Sexual Activity  . Alcohol use: Yes    Comment: occasional  . Drug use: No  . Sexual activity: Yes    Partners: Male    Birth control/protection: Implant  Lifestyle  . Physical activity:    Days per week: Not on file    Minutes per session: Not on file  . Stress: Not on file  Relationships  . Social connections:    Talks on phone: Not on file    Gets  together: Not on file    Attends religious service: Not on file    Active member of club or organization: Not on file    Attends meetings of clubs or organizations: Not on file    Relationship status: Not on file  Other Topics Concern  . Not on file  Social History Narrative   Single.     Lives in [place] with her mom and dad and her 98 y/o son [name].   Works as a Mining engineer with mental/developmental disabilities.   Describes somewhat wild teenage years, "made some bad choices and hung out with the wrong people".  However, after that she had no issues with drugs or alcohol.   Reports  that she had a second child a couple of years after her son [name] (2008) and she gave him/her up for adoption right after birth.                 Additional Social History:    Allergies:   Allergies  Allergen Reactions  . Penicillins Hives and Rash    Has patient had a PCN reaction causing immediate rash, facial/tongue/throat swelling, SOB or lightheadedness with hypotension: Yes Has patient had a PCN reaction causing severe rash involving mucus membranes or skin necrosis: No Has patient had a PCN reaction that required hospitalization: No Has patient had a PCN reaction occurring within the last 10 years: No If all of the above answers are "NO", then may proceed with Cephalosporin use.;     Labs:  No results found for this or any previous visit (from the past 48 hour(s)).  Current Facility-Administered Medications  Medication Dose Route Frequency Provider Last Rate Last Dose  . gabapentin (NEURONTIN) capsule 200 mg  200 mg Oral BID Mercedees Convery, MD   200 mg at 05/26/17 0957  . hydrOXYzine (ATARAX/VISTARIL) tablet 25 mg  25 mg Oral TID PRN Thedore Mins, MD   25 mg at 05/25/17 2114  . venlafaxine XR (EFFEXOR-XR) 24 hr capsule 150 mg  150 mg Oral Q breakfast Ahleah Simko, MD   150 mg at 05/26/17 1610   Current Outpatient Medications  Medication Sig Dispense Refill   . albuterol (PROVENTIL HFA;VENTOLIN HFA) 108 (90 Base) MCG/ACT inhaler Inhale 2 puffs into the lungs every 6 (six) hours as needed for wheezing or shortness of breath. 1 Inhaler 0  . baclofen (LIORESAL) 10 MG tablet Take 20 mg by mouth 2 (two) times daily as needed for muscle spasms.    . clonazePAM (KLONOPIN) 0.5 MG tablet Take 1 tablet (0.5 mg total) by mouth 2 (two) times daily as needed for anxiety. 90 tablet 1  . naproxen sodium (ANAPROX) 550 MG tablet Take 550 mg by mouth 2 (two) times daily.  0  . venlafaxine XR (EFFEXOR-XR) 75 MG 24 hr capsule Take 2 capsules (150 mg total) by mouth daily with breakfast. 180 capsule 1  . zolpidem (AMBIEN CR) 6.25 MG CR tablet Take 2 tablets (12.5 mg total) by mouth at bedtime as needed for sleep. 90 tablet 1  . rizatriptan (MAXALT) 10 MG tablet May repeat in 2 hours if needed--max of 30mg  in 24h period. (Patient not taking: Reported on 05/25/2017) 10 tablet 2  . valACYclovir (VALTREX) 1000 MG tablet Take 1 tablet (1,000 mg total) by mouth daily. (Patient not taking: Reported on 05/25/2017) 30 tablet 5  . vitamin B-12 (CYANOCOBALAMIN) 100 MCG tablet Take 1 tablet (100 mcg total) by mouth daily. (Patient not taking: Reported on 05/25/2017) 60 tablet 0    Musculoskeletal: Strength & Muscle Tone: within normal limits Gait & Station: normal Patient leans: N/A  Psychiatric Specialty Exam: Physical Exam  Constitutional: She is oriented to person, place, and time. She appears well-developed and well-nourished.  HENT:  Head: Normocephalic.  Respiratory: Effort normal.  Musculoskeletal: Normal range of motion.  Neurological: She is alert and oriented to person, place, and time.  Psychiatric: She has a normal mood and affect. Her speech is normal and behavior is normal. Thought content normal. Cognition and memory are normal. She expresses impulsivity.    Review of Systems  Psychiatric/Behavioral: Positive for depression and substance abuse. Negative for  hallucinations, memory loss and suicidal ideas. The patient is not nervous/anxious  and does not have insomnia.   All other systems reviewed and are negative.   Blood pressure 107/64, pulse (!) 55, temperature 97.6 F (36.4 C), resp. rate 16, SpO2 100 %.There is no height or weight on file to calculate BMI.  General Appearance: Casual  Eye Contact:  Good  Speech:  Clear and Coherent and Normal Rate  Volume:  Normal  Mood:  Euthymic  Affect:  Congruent  Thought Process:  Coherent, Goal Directed and Linear  Orientation:  Full (Time, Place, and Person)  Thought Content:  Logical  Suicidal Thoughts:  No  Homicidal Thoughts:  No  Memory:  Immediate;   Good Recent;   Good Remote;   Fair  Judgement:  Fair  Insight:  Fair  Psychomotor Activity:  Normal  Concentration:  Concentration: Good and Attention Span: Good  Recall:  Good  Fund of Knowledge:  Good  Language:  Good  Akathisia:  No  Handed:  Right  AIMS (if indicated):     Assets:  Administrator, artsCommunication Skills Financial Resources/Insurance Housing Social Support Transportation Vocational/Educational  ADL's:  Intact  Cognition:  WNL  Sleep:        Treatment Plan Summary: Plan Alcohol abuse with alcohol-induced mood disorder (HCC)  Discharge Home Take all medications as prescribed and discard old medications in your possession Follow up with [facility] outpatient for therapy Follow up with your outpatient provider for therapy and medication management Avoid the use of alcohol and illicit drugs  Disposition: No evidence of imminent risk to self or others at present.   Patient does not meet criteria for psychiatric inpatient admission. Supportive therapy provided about ongoing stressors. Discussed crisis plan, support from social network, calling 911, coming to the Emergency Department, and calling Suicide Hotline.  Laveda AbbeLaurie Britton Parks, NP 05/26/2017 11:28 AM  Patient seen face-to-face for psychiatric evaluation, chart reviewed and  case discussed with the physician extender and developed treatment plan. Reviewed the information documented and agree with the treatment plan. Thedore MinsMojeed Sarinah Doetsch, MD

## 2017-05-28 DIAGNOSIS — F4321 Adjustment disorder with depressed mood: Secondary | ICD-10-CM | POA: Diagnosis not present

## 2017-06-11 DIAGNOSIS — F4321 Adjustment disorder with depressed mood: Secondary | ICD-10-CM | POA: Diagnosis not present

## 2017-06-30 DIAGNOSIS — J0140 Acute pansinusitis, unspecified: Secondary | ICD-10-CM | POA: Diagnosis not present

## 2017-07-23 ENCOUNTER — Ambulatory Visit: Payer: BLUE CROSS/BLUE SHIELD | Admitting: Family Medicine

## 2017-07-23 ENCOUNTER — Encounter: Payer: Self-pay | Admitting: *Deleted

## 2017-07-23 DIAGNOSIS — Z0289 Encounter for other administrative examinations: Secondary | ICD-10-CM

## 2017-07-31 ENCOUNTER — Other Ambulatory Visit: Payer: Self-pay | Admitting: *Deleted

## 2017-07-31 MED ORDER — VENLAFAXINE HCL ER 75 MG PO CP24: 150.0000 mg | ORAL_CAPSULE | Freq: Every day | ORAL | 0 refills | Status: DC

## 2017-07-31 NOTE — Telephone Encounter (Signed)
Patient needs appt prior to any additional medication refills. Patient has been notified through MyChart and has recently missed her follow up appt.

## 2017-08-23 DIAGNOSIS — N10 Acute tubulo-interstitial nephritis: Secondary | ICD-10-CM | POA: Diagnosis not present

## 2017-08-29 ENCOUNTER — Encounter: Payer: Self-pay | Admitting: Family Medicine

## 2017-08-29 ENCOUNTER — Ambulatory Visit (INDEPENDENT_AMBULATORY_CARE_PROVIDER_SITE_OTHER): Payer: BLUE CROSS/BLUE SHIELD | Admitting: Family Medicine

## 2017-08-29 VITALS — BP 99/68 | HR 90 | Temp 98.2°F | Resp 20 | Ht 65.0 in | Wt 114.0 lb

## 2017-08-29 DIAGNOSIS — F418 Other specified anxiety disorders: Secondary | ICD-10-CM | POA: Diagnosis not present

## 2017-08-29 DIAGNOSIS — F410 Panic disorder [episodic paroxysmal anxiety] without agoraphobia: Secondary | ICD-10-CM | POA: Diagnosis not present

## 2017-08-29 DIAGNOSIS — G479 Sleep disorder, unspecified: Secondary | ICD-10-CM

## 2017-08-29 DIAGNOSIS — F411 Generalized anxiety disorder: Secondary | ICD-10-CM | POA: Diagnosis not present

## 2017-08-29 MED ORDER — ZOLPIDEM TARTRATE ER 6.25 MG PO TBCR: 12.5000 mg | EXTENDED_RELEASE_TABLET | Freq: Every evening | ORAL | 1 refills | Status: DC | PRN

## 2017-08-29 MED ORDER — MIRTAZAPINE 15 MG PO TABS: 15.0000 mg | ORAL_TABLET | Freq: Every day | ORAL | 1 refills | Status: DC

## 2017-08-29 MED ORDER — VENLAFAXINE HCL ER 150 MG PO CP24: 150.0000 mg | ORAL_CAPSULE | Freq: Every day | ORAL | 1 refills | Status: DC

## 2017-08-29 MED ORDER — CLONAZEPAM 0.5 MG PO TABS: 0.5000 mg | ORAL_TABLET | Freq: Two times a day (BID) | ORAL | 1 refills | Status: DC | PRN

## 2017-08-29 NOTE — Progress Notes (Signed)
Patient ID: Terri Terri [name], female   DOB: Nov 09, 1985, 32 y.o.   MRN: [id number]    Terri Terri [name] , Nov 09, 1985, 32 y.o., female MRN: [id number] Patient Care Team    Relationship Specialty Notifications Start End  Terri Terri [name], Terri Terri [name], Terri Terri [name] PCP - General Family Medicine  05/16/15   Terri Terri [name], Terri [name], Terri Terri [name] Consulting Physician Pain Medicine  03/17/13   Terri Terri [name], Terri Terri [name], Terri Terri [name] Consulting Physician Neurology  03/24/15     Chief Complaint  Patient presents with  . Anxiety  . Depression    Subjective:  Terri Terri [name] is Terri [name] 32 y.o. female presents today for follow up Generalized anxiety disorder/Depression with anxiety/Panic disorder Sleep disorder She reports she is doing ok overall. She still feels like she is having increased depression compared to last year. She is tolerating the increase in effexor from last visit. She continues to take the Ambien almost nightly. She uses the klonopin only with panic attacks--> rarely.   Prior note:  Patient is present for 3 month follow-up on her depression, anxiety and sleep disturbance. She has been without the Klonopin and Ambien secondary to pharmacy issues. She did have active refill at the pharmacy available to her. She has not had Terri [name] refill on this medication since August. She reports she is tolerating the Effexor 75 mg daily well. She does not feel her depression and anxiety is well controlled any longer. She is not sleeping without the use of the other anxiety medications. She has been offered counseling, but reports she does not feel she has the time take off of work. She feels the initial guilt feelings she was having one presented in August are resurfacing (see the full note below).  Prior note 09/18/2016: Pt presents for an OV with Increased complaint of anxiety and depression. She states over the last 2 months something has changed, she is not certain what it is and she is feeling depressed. She has been on medications in the past, and has seen counseling when she was Terri [name]  teenager. She reports she always felt like she really didn't need medications and could handle things on her own. Last noted she was on Celexa Terri [name] few years ago, and she felt like she didn't really need it and she was doing well. She reports this has changed over the last few months, she found that she is being very hard on herself and critical. She used to love to this dinner parties and be social with her family and friends, and now she doesn't enjoy as much because she is critical about what she says what she thinks she is cooking etc. She started Terri [name] new job Terri [name] few months ago, but states that she loves it, her boss is great since she does not think this is the source of her issue. She is prescribed Klonopin twice Terri [name] day when necessary for her long-term anxiety, she in the past has infrequently uses. She states she has been using it regularly as scheduled last few weeks. She also has continued to use her Ambien when needed. She states she knows she has some guilt surrounding the child she put up for adoption about 9 years ago. Her family has contact with the child (daughter), but patient does not. She has always felt she has dealt with that situation well, and did it was all right for the child, but admits today maybe she is dealing with it as well as she thought.    [facility] 08/29/2017 01/29/2017 10/25/2016 09/18/2016  05/30/2015  Decreased Interest 1 2 0 1 0  Down, Depressed, Hopeless 3 2 0 3 0  PHQ - 2 Score 4 4 0 4 0  Altered sleeping 3 3 0 1 -  Tired, decreased energy 3 2 1 1  -  Change in appetite 1 1 2 2  -  Feeling bad or failure about yourself  2 2 0 1 -  Trouble concentrating 1 1 0 1 -  Moving slowly or fidgety/restless 0 1 0 0 -  Suicidal thoughts 0 0 0 0 -  PHQ-9 Score 14 14 3 10  -  Difficult doing work/chores Somewhat difficult - Not difficult at all - -   GAD 7 : Generalized Anxiety Score 08/29/2017 01/29/2017 10/25/2016 09/18/2016  Nervous, Anxious, on Edge 1 1 1 3   Control/stop  worrying 0 3 0 3  Worry too much - different things 0 2 1 3   Trouble relaxing 1 2 1 1   Restless 1 1 1  0  Easily annoyed or irritable 0 1 0 1  Afraid - awful might happen 0 0 0 0  Total GAD 7 Score 3 10 4 11   Anxiety Difficulty Somewhat difficult - Somewhat difficult -     Allergies  Allergen Reactions  . Penicillins Hives and Rash    Has patient had Terri [name] PCN reaction causing immediate rash, facial/tongue/throat swelling, SOB or lightheadedness with hypotension: Yes Has patient had Terri [name] PCN reaction causing severe rash involving mucus membranes or skin necrosis: No Has patient had Terri [name] PCN reaction that required hospitalization: No Has patient had Terri [name] PCN reaction occurring within the last 10 years: No If all of the above answers are "NO", then may proceed with Cephalosporin use.;    Social History   Tobacco Use  . Smoking status: Former Smoker    Packs/day: 0.30    Types: Cigarettes    Last attempt to quit: 02/12/2007    Years since quitting: 10.5  . Smokeless tobacco: Never Used  Substance Use Topics  . Alcohol use: Yes    Comment: occasional   Past Medical History:  Diagnosis Date  . Allergy   . Brain injury [facility]) 32 yrs old   fell off babysitters cabinet, and on Terri [name] separate occasion she fell down Terri [name] flight of stairs  . Closed head injury About age 65 yrs   ICH per pt, with seizure disorder for appox 2 yrs afterward.  No significant residual disability.  . Depression with anxiety    Failed buspar.  Did well on citalopram.  . Drug overdose 2004   Xanax  . H/O varicella   . History of chlamydia   . HSV infection    Genital clx pos HSV 2--2007.  Marland Kitchen Hx of migraines    chronic migraines (Dr. Everlena Cooper) 03/2015  . Hx: UTI (urinary tract infection) 09/06/10  . IBS (irritable bowel syndrome)   . Right hip pain 05/2009   MRI 10/12/09: Femoral head and neck stress injury plus small acetabular labral tear.  [facility] ortho (Dr. Caswell Corwin) as of 11/2012.  Preferred pain mgmt: steroid injection into right  subtrochanteric bursa 12/06/15.  . Seizure [facility])    for 2 yrs after closed head injury/ICH   Past Surgical History:  Procedure Laterality Date  . HIP SURGERY  10/10/2010   Right hip GT bursectomy and osteophytectomy for intractable right greater troch pain (Dr. Despina Hick).  [facility] second opinion 06/2012--resulted in repeat MRI, then right hip IA injection that gave mild short term response, then was referred to PT  with iontophoresis (as of 09/07/12)--was told to f/u in 6 wks.  Marland Kitchen MOLE REMOVAL     on back  . WISDOM TOOTH EXTRACTION     Family History  Problem Relation Age of Onset  . Breast cancer Mother 30  . Thyroid cancer Mother   . Hepatitis Father        Hep C  . Alcohol abuse Father   . Thyroid disease Sister   . Anxiety disorder Sister   . Thyroid cancer Sister   . Thrombophlebitis Maternal Grandfather   . Anxiety disorder Maternal Grandfather   . Heart disease Maternal Grandfather   . Alzheimer's disease Paternal Grandmother    Allergies as of 08/29/2017      Reactions   Penicillins Hives, Rash   Has patient had Terri [name] PCN reaction causing immediate rash, facial/tongue/throat swelling, SOB or lightheadedness with hypotension: Yes Has patient had Terri [name] PCN reaction causing severe rash involving mucus membranes or skin necrosis: No Has patient had Terri [name] PCN reaction that required hospitalization: No Has patient had Terri [name] PCN reaction occurring within the last 10 years: No If all of the above answers are "NO", then may proceed with Cephalosporin use.;      Medication List        Accurate as of 08/29/17  8:16 AM. Always use your most recent med list.          albuterol 108 (90 Base) MCG/ACT inhaler Commonly known as:  PROVENTIL HFA;VENTOLIN HFA Inhale 2 puffs into the lungs every 6 (six) hours as needed for wheezing or shortness of breath.   clonazePAM 0.5 MG tablet Commonly known as:  KLONOPIN Take 1 tablet (0.5 mg total) by mouth 2 (two) times daily as needed for anxiety.   rizatriptan  10 MG tablet Commonly known as:  MAXALT May repeat in 2 hours if needed--max of 30mg  in 24h period.   valACYclovir 1000 MG tablet Commonly known as:  VALTREX Take 1 tablet (1,000 mg total) by mouth daily.   venlafaxine XR 75 MG 24 hr capsule Commonly known as:  EFFEXOR-XR Take 2 capsules (150 mg total) by mouth daily with breakfast. Needs appt prior to any additional refills.   vitamin B-12 100 MCG tablet Commonly known as:  CYANOCOBALAMIN Take 1 tablet (100 mcg total) by mouth daily.   zolpidem 6.25 MG CR tablet Commonly known as:  AMBIEN CR Take 2 tablets (12.5 mg total) by mouth at bedtime as needed for sleep.       No results found for this or any previous visit (from the past 24 hour(s)). No results found.   ROS: Negative, with the exception of above mentioned in HPI  Objective:  BP 99/68 (BP Location: Right Arm, Patient Position: Sitting, Cuff Size: Normal)   Pulse 90   Temp 98.2 F (36.8 C)   Resp 20   Ht 5\' 5"  (1.651 m)   Wt 114 lb (51.7 kg)   SpO2 98%   BMI 18.97 kg/m  Body mass index is 18.97 kg/m. Gen: Afebrile. No acute distress.  HENT: AT. [place].  MMM.  Eyes:Pupils Equal Round Reactive to light, Extraocular movements intact,  Conjunctiva without redness, discharge or icterus. CV: RRR no murmur, no edema, +2/4 P posterior tibialis pulses Chest: CTAB, no wheeze or crackles Abd: Soft. NTND. BS present. no Masses palpated.  Skin: no rashes, purpura or petechiae.  Neuro: Normal gait. PERLA. EOMi. Alert. Oriented x3  Psych: Normal affect, dress and demeanor. Normal speech. Normal thought content and judgment.  Assessment/Plan: Terri Terri [name] is Terri [name] 32 y.o. female present for acute OV for  Generalized anxiety disorder Depression with anxiety Panic disorder Sleep disorder - Discussed multiple options with patient today. She is still uncertain if she has enough time to seek counseling, but agrees she would find benefit in going. She will discuss with her  employer and see if they are willing to make time for her to attend counseling, if so she will call in and I will place Terri [name] referral again for her. - [facility] database reviewed 08/29/17 - benzo/ambien- control substance contract re-signed 08/29/2017  - continue  Effexor to 150 mg daily.  - continue Klonopin 0.5 mg twice Terri [name] day when necessary. Patient does not use this medication often.  - added remeron to her regimen to help with depression and possibly sleep. Asked her to hold the [place] until she understands if remeron will make her sleepy. May not need ambien or less. She is also ok with the idea this may increase her appetite. She is hoping for some weight gain.  - Follow-up in 6 months unless needed sooner.     electronically signed by:  Felix Pacini, Terri Terri [name]  [facility] [place]

## 2017-08-29 NOTE — Patient Instructions (Signed)
Continue effexor at 150 mg a day (new pill is 150 mg-take 1) Refilled klonopin and ambien. Do not take  Ambien for at least 1 week after starting Remeron, since both make on sleepy--> you may find you do not need the Ambien or at least less dose.  Follow up 6 month, sooner if needed    Please help us help you:  We are [facility]. Below you will find basic instructions that you may need to access in the future. Please help us help you by reading the instructions, which cover many of the frequent questions we experience.   Prescription refills and request:  -In order to allow more efficient response time, please call your pharmacy for all refills. They will forward the request electronically to us. This allows for the quickest possible response. Request left on a nurse line can take longer to refill, since these are checked as time allows between office patients and other phone calls.  - refill request can take up to 3-5 working days to complete.  - If request is sent electronically and request is appropiate, it is usually completed in 1-2 business days.  - all patients will need to be seen routinely for all chronic medical conditions requiring prescription medications (see follow-up below). If you are overdue for follow up on your condition, you will be asked to make an appointment and we will call in enough medication to cover you until your appointment (up to 30 days).  - all controlled substances will require a face to face visit to request/refill.  - if you desire your prescriptions to go through a new pharmacy, and have an active script at original pharmacy, you will need to call your pharmacy and have scripts transferred to new pharmacy. This is completed between the pharmacy locations and not by your provider.    Results: If any images or labs were ordered, it can take up to 1 week to get results depending on the test ordered and the  lab/facility running and resulting the test. - Normal or stable results, which do not need further discussion, may be released to your mychart immediately with attached note to you. A call may not be generated for normal results. Please make certain to sign up for mychart. If you have questions on how to activate your mychart you can call the front office.  - If your results need further discussion, our office will attempt to contact you via phone, and if unable to reach you after 2 attempts, we will release your abnormal result to your mychart with instructions.  - All results will be automatically released in mychart after 1 week.  - Your provider will provide you with explanation and instruction on all relevant material in your results. Please keep in mind, results and labs may appear confusing or abnormal to the untrained eye, but it does not mean they are actually abnormal for you personally. If you have any questions about your results that are not covered, or you desire more detailed explanation than what was provided, you should make an appointment with your provider to do so.   Our office handles many outgoing and incoming calls daily. If we have not contacted you within 1 week about your results, please check your mychart to see if there is a message first and if not, then contact our office.  In helping with this matter, you help decrease call volume, and therefore allow  [place] to be able to respond to patients needs more efficiently.   Acute office visits (sick visit):  An acute visit is intended for a new problem and are scheduled in shorter time slots to allow schedule openings for patients with new problems. This is the appropriate visit to discuss a new problem. Problems will not be addressed by phone call or Echart message. Appointment is needed if requesting treatment. In order to provide you with excellent quality medical care with proper time for you to explain your problem, have an exam and  receive treatment with instructions, these appointments should be limited to one new problem per visit. If you experience a new problem, in which you desire to be addressed, please make an acute office visit, we save openings on the schedule to accommodate you. Please do not save your new problem for any other type of visit, let us take care of it properly and quickly for you.   Follow up visits:  Depending on your condition(s) your provider will need to see you routinely in order to provide you with quality care and prescribe medication(s). Most chronic conditions (Example: hypertension, Diabetes, depression/anxiety... etc), require visits a couple times a year. Your provider will instruct you on proper follow up for your personal medical conditions and history. Please make certain to make follow up appointments for your condition as instructed. Failing to do so could result in lapse in your medication treatment/refills. If you request a refill, and are overdue to be seen on a condition, we will always provide you with a 30 day script (once) to allow you time to schedule.    Medicare wellness (well visit): - we have a wonderful Nurse Maudie Mercury), that will meet with you and provide you will yearly medicare wellness visits. These visits should occur yearly (can not be scheduled less than 1 calendar year apart) and cover preventive health, immunizations, advance directives and screenings you are entitled to yearly through your medicare benefits. Do not miss out on your entitled benefits, this is when medicare will pay for these benefits to be ordered for you.  These are strongly encouraged by your provider and is the appropriate type of visit to make certain you are up to date with all preventive health benefits. If you have not had your medicare wellness exam in the last 12 months, please make certain to schedule one by calling the office and schedule your medicare wellness with Maudie Mercury as soon as possible.   Yearly  physical (well visit):  - Adults are recommended to be seen yearly for physicals. Check with your insurance and date of your last physical, most insurances require one calendar year between physicals. Physicals include all preventive health topics, screenings, medical exam and labs that are appropriate for gender/age and history. You may have fasting labs needed at this visit. This is a well visit (not a sick visit), new problems should not be covered during this visit (see acute visit).  - Pediatric patients are seen more frequently when they are younger. Your provider will advise you on well child visit timing that is appropriate for your their age. - This is not a medicare wellness visit. Medicare wellness exams do not have an exam portion to the visit. Some medicare companies allow for a physical, some do not allow a yearly physical. If your medicare allows a yearly physical you can schedule the medicare wellness with our nurse Maudie Mercury and have your physical with your provider after, on the same day. Please  check with insurance for your full benefits.   Late Policy/No Shows:  - all new patients should arrive 15-30 minutes earlier than appointment to allow [place] time  to  obtain all personal demographics,  insurance information and for you to complete office paperwork. - All established patients should arrive 10-15 minutes earlier than appointment time to update all information and be checked in .  - In our best efforts to run on time, if you are late for your appointment you will be asked to either reschedule or if able, we will work you back into the schedule. There will be a wait time to work you back in the schedule,  depending on availability.  - If you are unable to make it to your appointment as scheduled, please call 24 hours ahead of time to allow [place] to fill the time slot with someone else who needs to be seen. If you do not cancel your appointment ahead of time, you may be charged a no show fee.

## 2017-09-15 DIAGNOSIS — N390 Urinary tract infection, site not specified: Secondary | ICD-10-CM | POA: Diagnosis not present

## 2017-10-01 ENCOUNTER — Encounter: Payer: Self-pay | Admitting: Family Medicine

## 2017-10-02 ENCOUNTER — Ambulatory Visit: Payer: BLUE CROSS/BLUE SHIELD | Admitting: Family Medicine

## 2017-10-03 ENCOUNTER — Ambulatory Visit: Payer: BLUE CROSS/BLUE SHIELD | Admitting: Family Medicine

## 2017-10-03 DIAGNOSIS — Z0289 Encounter for other administrative examinations: Secondary | ICD-10-CM

## 2017-11-17 ENCOUNTER — Ambulatory Visit: Payer: Self-pay | Admitting: Psychology

## 2017-11-27 ENCOUNTER — Encounter: Payer: Self-pay | Admitting: Family Medicine

## 2017-12-17 ENCOUNTER — Ambulatory Visit: Payer: BLUE CROSS/BLUE SHIELD | Admitting: Psychology

## 2017-12-17 DIAGNOSIS — F320 Major depressive disorder, single episode, mild: Secondary | ICD-10-CM | POA: Diagnosis not present

## 2017-12-22 ENCOUNTER — Ambulatory Visit: Payer: BLUE CROSS/BLUE SHIELD | Admitting: Psychology

## 2017-12-22 DIAGNOSIS — F320 Major depressive disorder, single episode, mild: Secondary | ICD-10-CM | POA: Diagnosis not present

## 2017-12-24 DIAGNOSIS — J019 Acute sinusitis, unspecified: Secondary | ICD-10-CM | POA: Diagnosis not present

## 2017-12-25 DIAGNOSIS — R509 Fever, unspecified: Secondary | ICD-10-CM | POA: Diagnosis not present

## 2017-12-25 DIAGNOSIS — J029 Acute pharyngitis, unspecified: Secondary | ICD-10-CM | POA: Diagnosis not present

## 2017-12-25 DIAGNOSIS — H66001 Acute suppurative otitis media without spontaneous rupture of ear drum, right ear: Secondary | ICD-10-CM | POA: Diagnosis not present

## 2017-12-29 ENCOUNTER — Ambulatory Visit: Payer: BLUE CROSS/BLUE SHIELD | Admitting: Psychology

## 2017-12-29 DIAGNOSIS — F320 Major depressive disorder, single episode, mild: Secondary | ICD-10-CM | POA: Diagnosis not present

## 2018-01-05 DIAGNOSIS — M1611 Unilateral primary osteoarthritis, right hip: Secondary | ICD-10-CM | POA: Diagnosis not present

## 2018-01-05 DIAGNOSIS — M25551 Pain in right hip: Secondary | ICD-10-CM | POA: Diagnosis not present

## 2018-01-05 DIAGNOSIS — G894 Chronic pain syndrome: Secondary | ICD-10-CM | POA: Diagnosis not present

## 2018-01-05 DIAGNOSIS — S73191A Other sprain of right hip, initial encounter: Secondary | ICD-10-CM | POA: Diagnosis not present

## 2018-01-06 ENCOUNTER — Ambulatory Visit: Payer: Self-pay | Admitting: Psychology

## 2018-01-07 ENCOUNTER — Other Ambulatory Visit: Payer: Self-pay | Admitting: Pain Medicine

## 2018-01-07 DIAGNOSIS — M25551 Pain in right hip: Secondary | ICD-10-CM

## 2018-01-28 ENCOUNTER — Ambulatory Visit
Admission: RE | Admit: 2018-01-28 | Discharge: 2018-01-28 | Disposition: A | Discharge status: Home / Self Care | Payer: BLUE CROSS/BLUE SHIELD | Source: Ambulatory Visit | Attending: Pain Medicine | Admitting: Pain Medicine

## 2018-01-28 DIAGNOSIS — M25551 Pain in right hip: Secondary | ICD-10-CM | POA: Diagnosis not present

## 2018-02-02 DIAGNOSIS — M25551 Pain in right hip: Secondary | ICD-10-CM | POA: Diagnosis not present

## 2018-02-02 DIAGNOSIS — M7061 Trochanteric bursitis, right hip: Secondary | ICD-10-CM | POA: Diagnosis not present

## 2018-02-02 DIAGNOSIS — Z79899 Other long term (current) drug therapy: Secondary | ICD-10-CM | POA: Diagnosis not present

## 2018-02-02 DIAGNOSIS — G894 Chronic pain syndrome: Secondary | ICD-10-CM | POA: Diagnosis not present

## 2018-02-02 DIAGNOSIS — S73191A Other sprain of right hip, initial encounter: Secondary | ICD-10-CM | POA: Diagnosis not present

## 2018-02-02 DIAGNOSIS — Z79891 Long term (current) use of opiate analgesic: Secondary | ICD-10-CM | POA: Diagnosis not present

## 2018-03-04 DIAGNOSIS — M25551 Pain in right hip: Secondary | ICD-10-CM | POA: Diagnosis not present

## 2018-03-04 DIAGNOSIS — M7061 Trochanteric bursitis, right hip: Secondary | ICD-10-CM | POA: Diagnosis not present

## 2018-03-04 DIAGNOSIS — S73191A Other sprain of right hip, initial encounter: Secondary | ICD-10-CM | POA: Diagnosis not present

## 2018-03-04 DIAGNOSIS — G894 Chronic pain syndrome: Secondary | ICD-10-CM | POA: Diagnosis not present

## 2018-03-10 ENCOUNTER — Other Ambulatory Visit: Payer: Self-pay | Admitting: Family Medicine

## 2018-03-10 DIAGNOSIS — G479 Sleep disorder, unspecified: Secondary | ICD-10-CM

## 2018-03-10 NOTE — Telephone Encounter (Signed)
Pt was called and appt was scheduled for 03/13/2018. Pt verbalized understanding that no refills could be called in for Ambien until pt had F/U appt with MD

## 2018-03-10 NOTE — Telephone Encounter (Signed)
RF request for Ambien  LOV: 08/29/17 Next ov: Not Scheduled  Last written:08/29/17 #90 x1 refill   Please Advise

## 2018-03-10 NOTE — Telephone Encounter (Signed)
Remus Loffler is a controlled substance. Patients must be seen with provider face to face to refill.

## 2018-03-11 DIAGNOSIS — M7061 Trochanteric bursitis, right hip: Secondary | ICD-10-CM | POA: Diagnosis not present

## 2018-03-13 ENCOUNTER — Encounter: Payer: Self-pay | Admitting: Family Medicine

## 2018-03-13 ENCOUNTER — Ambulatory Visit: Payer: BLUE CROSS/BLUE SHIELD | Admitting: Family Medicine

## 2018-03-13 VITALS — BP 91/61 | HR 94 | Temp 98.7°F | Resp 16 | Ht 65.0 in | Wt 139.5 lb

## 2018-03-13 DIAGNOSIS — F418 Other specified anxiety disorders: Secondary | ICD-10-CM

## 2018-03-13 DIAGNOSIS — Z23 Encounter for immunization: Secondary | ICD-10-CM | POA: Diagnosis not present

## 2018-03-13 DIAGNOSIS — G479 Sleep disorder, unspecified: Secondary | ICD-10-CM | POA: Diagnosis not present

## 2018-03-13 DIAGNOSIS — F411 Generalized anxiety disorder: Secondary | ICD-10-CM

## 2018-03-13 DIAGNOSIS — F410 Panic disorder [episodic paroxysmal anxiety] without agoraphobia: Secondary | ICD-10-CM | POA: Diagnosis not present

## 2018-03-13 MED ORDER — VENLAFAXINE HCL ER 150 MG PO CP24: 150.0000 mg | ORAL_CAPSULE | Freq: Every day | ORAL | 1 refills | Status: DC

## 2018-03-13 MED ORDER — CLONAZEPAM 0.5 MG PO TABS: 0.5000 mg | ORAL_TABLET | Freq: Two times a day (BID) | ORAL | 5 refills | Status: DC | PRN

## 2018-03-13 MED ORDER — MIRTAZAPINE 30 MG PO TABS: 30.0000 mg | ORAL_TABLET | Freq: Every day | ORAL | 1 refills | Status: DC

## 2018-03-13 MED ORDER — ZOLPIDEM TARTRATE ER 6.25 MG PO TBCR: 12.5000 mg | EXTENDED_RELEASE_TABLET | Freq: Every evening | ORAL | 1 refills | Status: DC | PRN

## 2018-03-13 NOTE — Progress Notes (Signed)
Patient ID: Terri [name], female   DOB: 10-23-1985, 33 y.o.   MRN: [id number]    Terri [name] , 13-Apr-1985, 33 y.o., female MRN: [id number] Patient Care Team    Relationship Specialty Notifications Start End  Natalia Leatherwood, DO PCP - General Family Medicine  05/16/15   Ardell Isaacs, MD Consulting Physician Pain Medicine  03/17/13   Drema Dallas, DO Consulting Physician Neurology  03/24/15     Chief Complaint  Patient presents with  . Anxiety    Pt needs refills on medications   . Insomnia    Pt has been unable to sleep recently     Subjective:  Terri [name] is a 33 y.o. female presents today for follow up Generalized anxiety disorder/Depression with anxiety/Panic disorder Sleep disorder Pt reports she is going through another bad spot. She feels this time it is stress from work and she was ina relationship that was causing stress. She is not sleeping at all. She has been out of her Ambien. She reports the remeron 15 mg  was helping her sleep, when first started, but not since.  She has been out of the [place]. She rarely uses the clonopin only in panic attacks.   Prior note:  She reports she is doing ok overall. She still feels like she is having increased depression compared to last year. She is tolerating the increase in effexor from last visit. She continues to take the Ambien almost nightly. She uses the klonopin only with panic attacks--> rarely.   Prior note:  Patient is present for 3 month follow-up on her depression, anxiety and sleep disturbance. She has been without the Klonopin and Ambien secondary to pharmacy issues. She did have active refill at the pharmacy available to her. She has not had a refill on this medication since August. She reports she is tolerating the Effexor 75 mg daily well. She does not feel her depression and anxiety is well controlled any longer. She is not sleeping without the use of the other anxiety medications. She has been offered counseling,  but reports she does not feel she has the time take off of work. She feels the initial guilt feelings she was having one presented in August are resurfacing (see the full note below).  Prior note 09/18/2016: Pt presents for an OV with Increased complaint of anxiety and depression. She states over the last 2 months something has changed, she is not certain what it is and she is feeling depressed. She has been on medications in the past, and has seen counseling when she was a teenager. She reports she always felt like she really didn't need medications and could handle things on her own. Last noted she was on Celexa a few years ago, and she felt like she didn't really need it and she was doing well. She reports this has changed over the last few months, she found that she is being very hard on herself and critical. She used to love to this dinner parties and be social with her family and friends, and now she doesn't enjoy as much because she is critical about what she says what she thinks she is cooking etc. She started a new job a few months ago, but states that she loves it, her boss is great since she does not think this is the source of her issue. She is prescribed Klonopin twice a day when necessary for her long-term anxiety, she in the past has infrequently uses. She  states she has been using it regularly as scheduled last few weeks. She also has continued to use her Ambien when needed. She states she knows she has some guilt surrounding the child she put up for adoption about 9 years ago. Her family has contact with the child (daughter), but patient does not. She has always felt she has dealt with that situation well, and did it was all right for the child, but admits today maybe she is dealing with it as well as she thought.    Depression screen [facility] 2/9 03/13/2018 08/29/2017 01/29/2017 10/25/2016 09/18/2016  Decreased Interest 3 1 2  0 1  Down, Depressed, Hopeless 3 3 2  0 3  PHQ - 2 Score 6 4 4  0 4  Altered  sleeping 3 3 3  0 1  Tired, decreased energy 3 3 2 1 1   Change in appetite 0 1 1 2 2   Feeling bad or failure about yourself  3 2 2  0 1  Trouble concentrating 2 1 1  0 1  Moving slowly or fidgety/restless 1 0 1 0 0  Suicidal thoughts 0 0 0 0 0  PHQ-9 Score 18 14 14 3 10   Difficult doing work/chores Somewhat difficult Somewhat difficult - Not difficult at all -   GAD 7 : Generalized Anxiety Score 03/13/2018 08/29/2017 01/29/2017 10/25/2016  Nervous, Anxious, on Edge 2 1 1 1   Control/stop worrying 3 0 3 0  Worry too much - different things 3 0 2 1  Trouble relaxing 3 1 2 1   Restless 2 1 1 1   Easily annoyed or irritable 2 0 1 0  Afraid - awful might happen 0 0 0 0  Total GAD 7 Score 15 3 10 4   Anxiety Difficulty Somewhat difficult Somewhat difficult - Somewhat difficult    Allergies  Allergen Reactions  . Penicillins Hives and Rash    Has patient had a PCN reaction causing immediate rash, facial/tongue/throat swelling, SOB or lightheadedness with hypotension: Yes Has patient had a PCN reaction causing severe rash involving mucus membranes or skin necrosis: No Has patient had a PCN reaction that required hospitalization: No Has patient had a PCN reaction occurring within the last 10 years: No If all of the above answers are "NO", then may proceed with Cephalosporin use.;    Social History   Tobacco Use  . Smoking status: Former Smoker    Packs/day: 0.30    Types: Cigarettes    Last attempt to quit: 02/12/2007    Years since quitting: 11.0  . Smokeless tobacco: Never Used  Substance Use Topics  . Alcohol use: Yes    Comment: occasional   Past Medical History:  Diagnosis Date  . Allergy   . Brain injury [facility]) 33 yrs old   fell off babysitters cabinet, and on a separate occasion she fell down a flight of stairs  . Closed head injury About age 71 yrs   ICH per pt, with seizure disorder for appox 2 yrs afterward.  No significant residual disability.  . Depression with anxiety     Failed buspar.  Did well on citalopram.  . Drug overdose 2004   Xanax  . H/O varicella   . History of chlamydia   . HSV infection    Genital clx pos HSV 2--2007.  Marland Kitchen Hx of migraines    chronic migraines (Dr. Everlena Cooper) 03/2015  . Hx: UTI (urinary tract infection) 09/06/10  . IBS (irritable bowel syndrome)   . Right hip pain 05/2009   MRI  10/12/09: Femoral head and neck stress injury plus small acetabular labral tear.  [facility] ortho (Dr. Caswell CorwinStubbs) as of 11/2012.  Preferred pain mgmt: steroid injection into right subtrochanteric bursa 12/06/15.  . Seizure [facility](HCC)    for 2 yrs after closed head injury/ICH   Past Surgical History:  Procedure Laterality Date  . HIP SURGERY  10/10/2010   Right hip GT bursectomy and osteophytectomy for intractable right greater troch pain (Dr. Despina HickAlusio).  [facility] second opinion 06/2012--resulted in repeat MRI, then right hip IA injection that gave mild short term response, then was referred to PT with iontophoresis (as of 09/07/12)--was told to f/u in 6 wks.  Marland Kitchen. MOLE REMOVAL     on back  . WISDOM TOOTH EXTRACTION     Family History  Problem Relation Age of Onset  . Breast cancer Mother 1345  . Thyroid cancer Mother   . Hepatitis Father        Hep C  . Alcohol abuse Father   . Thyroid disease Sister   . Anxiety disorder Sister   . Thyroid cancer Sister   . Thrombophlebitis Maternal Grandfather   . Anxiety disorder Maternal Grandfather   . Heart disease Maternal Grandfather   . Alzheimer's disease Paternal Grandmother    Allergies as of 03/13/2018      Reactions   Penicillins Hives, Rash   Has patient had a PCN reaction causing immediate rash, facial/tongue/throat swelling, SOB or lightheadedness with hypotension: Yes Has patient had a PCN reaction causing severe rash involving mucus membranes or skin necrosis: No Has patient had a PCN reaction that required hospitalization: No Has patient had a PCN reaction occurring within the last 10 years: No If all of the above answers  are "NO", then may proceed with Cephalosporin use.;      Medication List       Accurate as of March 13, 2018  8:55 AM. Always use your most recent med list.        albuterol 108 (90 Base) MCG/ACT inhaler Commonly known as:  PROVENTIL HFA;VENTOLIN HFA Inhale 2 puffs into the lungs every 6 (six) hours as needed for wheezing or shortness of breath.   clonazePAM 0.5 MG tablet Commonly known as:  KLONOPIN Take 1 tablet (0.5 mg total) by mouth 2 (two) times daily as needed for anxiety.   HYDROcodone-acetaminophen 10-325 MG tablet Commonly known as:  NORCO Take 1 tablet by mouth 3 (three) times daily.   mirtazapine 15 MG tablet Commonly known as:  REMERON Take 1 tablet (15 mg total) by mouth at bedtime.   rizatriptan 10 MG tablet Commonly known as:  MAXALT May repeat in 2 hours if needed--max of 30mg  in 24h period.   valACYclovir 1000 MG tablet Commonly known as:  VALTREX Take 1 tablet (1,000 mg total) by mouth daily.   venlafaxine XR 150 MG 24 hr capsule Commonly known as:  EFFEXOR-XR Take 1 capsule (150 mg total) by mouth daily with breakfast. Needs appt prior to any additional refills.   vitamin B-12 100 MCG tablet Commonly known as:  CYANOCOBALAMIN Take 1 tablet (100 mcg total) by mouth daily.   zolpidem 6.25 MG CR tablet Commonly known as:  AMBIEN CR Take 2 tablets (12.5 mg total) by mouth at bedtime as needed for sleep.       No results found for this or any previous visit (from the past 24 hour(s)). No results found.   ROS: Negative, with the exception of above mentioned in HPI  Objective:  BP 91/61 (BP Location: Left Arm, Patient Position: Sitting, Cuff Size: Normal)   Pulse 94   Temp 98.7 F (37.1 C) (Oral)   Resp 16   Ht 5\' 5"  (1.651 m)   Wt 139 lb 8 oz (63.3 kg)   SpO2 100%   BMI 23.21 kg/m  Body mass index is 23.21 kg/m. Gen: Afebrile. No acute distress. Nontoxic. Tearful today. HENT: AT. [place].  MMM.  Eyes:Pupils Equal Round Reactive to  light, Extraocular movements intact,  Conjunctiva without redness, discharge or icterus. CV: RRR  Chest: CTAB, no wheeze or crackles Neuro: Normal gait. PERLA. EOMi. Alert. Oriented. Psych: emotional and tearful today. Normal affect, dress and demeanor. Normal speech. Normal thought content and judgment.  Assessment/Plan: Terri [name] is a 33 y.o. female present for acute OV for  Generalized anxiety disorder Depression with anxiety Panic disorder Sleep disorder - Still not well controlled. She was doing better- but recently feels worsening again.  - She does not feel she is able to take the time off work to go to counseling. [facility] information provided to her today.   - [facility] database reviewed 03/13/18 - benzo/ambien- control substance contract re-signed 08/29/2017  - continue  Effexor to 150 mg daily.  - continue Klonopin 0.5 mg twice a day when necessary. Patient does not use this medication often. Told her to try a half tab daily to help with increased anxiety.  - Increased remeron to 30 mg a day.  Asked her to hold [place]. Only use if remeron doe snot make her sedated she reports understanding.  May not need ambien or less. She is also ok with the idea this may increase her appetite. She is hoping for some weight gain.  - Follow-up in 6 months unless needed sooner.     > 25 minutes spent with patient, >50% of time spent face to face counseling     electronically signed by:  Felix Pacinienee Kuneff, DO  [facility] [place]

## 2018-03-13 NOTE — Patient Instructions (Signed)
Increased Remeron to 30 mg before bed.  Take the clonopin 1 tab up to two times a day as needed. Try to at least take a half tab in the day if your anxiety is increased, since it does nto make you tired.  effexor dose unchanged.  Follow up in 6 mos- sooner if needed.     Persistent Depressive Disorder  Persistent depressive disorder (PDD) is a mental health condition. PDD causes symptoms of low-level depression for 2 years or longer. It may also be called long-term (chronic) depression or dysthymia. PDD may include episodes of more severe depression that last for about 2 weeks (major depressive disorder or MDD). PDD can affect the way you think, feel, and sleep. This condition may also affect your relationships. You may be more likely to get sick if you have PDD. Symptoms of PDD occur for most of the day and may include:  Feeling tired (fatigue).  Low energy.  Eating too much or too little.  Sleeping too much or too little.  Feeling restless or agitated.  Feeling hopeless.  Feeling worthless or guilty.  Feeling worried or nervous (anxiety).  Trouble concentrating or making decisions.  Low self-esteem.  A negative way of looking at things (out[place]).  Not being able to have fun or feel pleasure.  Avoiding interacting with people.  Getting angry or annoyed easily (irritability).  Acting aggressive or angry. Follow these instructions at home: Activity  Go back to your normal activities as told by your doctor.  Exercise regularly as told by your doctor. General instructions  Take over-the-counter and prescription medicines only as told by your doctor.  Do not drink alcohol. Or, limit how much alcohol you drink to no more than 1 drink a day for nonpregnant women and 2 drinks a day for men. One drink equals 12 oz of beer, 5 oz of wine, or 1 oz of hard liquor. Alcohol can affect any antidepressant medicines you are taking. Talk with your doctor about your alcohol  use.  Eat a healthy diet and get plenty of sleep.  Find activities that you enjoy each day.  Consider joining a support group. Your doctor may be able to suggest a support group.  Keep all follow-up visits as told by your doctor. This is important. Where to find more information [employer] on Mental Illness  www.nami.org [employer] of Mental Health  http://www.maynard.net/ National Suicide Prevention Lifeline  (803)017-1379).  This is free, 24-hour help. Contact a doctor if:  Your symptoms get worse.  You have new symptoms.  You have trouble sleeping or doing your daily activities. Get help right away if:  You self-harm.  You have serious thoughts about hurting yourself or others.  You see, hear, taste, smell, or feel things that are not there (hallucinate). This information is not intended to replace advice given to you by your health care provider. Make sure you discuss any questions you have with your health care provider. Document Released: 01/09/2015 Document Revised: 09/22/2015 Document Reviewed: 09/22/2015 Elsevier Interactive Patient Education  [employer].

## 2018-04-06 ENCOUNTER — Ambulatory Visit: Payer: BLUE CROSS/BLUE SHIELD | Admitting: Psychology

## 2018-04-07 DIAGNOSIS — M25552 Pain in left hip: Secondary | ICD-10-CM | POA: Diagnosis not present

## 2018-04-07 DIAGNOSIS — Z4789 Encounter for other orthopedic aftercare: Secondary | ICD-10-CM | POA: Diagnosis not present

## 2018-04-07 DIAGNOSIS — M25551 Pain in right hip: Secondary | ICD-10-CM | POA: Diagnosis not present

## 2018-04-13 DIAGNOSIS — G894 Chronic pain syndrome: Secondary | ICD-10-CM | POA: Diagnosis not present

## 2018-04-13 DIAGNOSIS — M7061 Trochanteric bursitis, right hip: Secondary | ICD-10-CM | POA: Diagnosis not present

## 2018-04-13 DIAGNOSIS — M25551 Pain in right hip: Secondary | ICD-10-CM | POA: Diagnosis not present

## 2018-04-13 DIAGNOSIS — S73191A Other sprain of right hip, initial encounter: Secondary | ICD-10-CM | POA: Diagnosis not present

## 2018-04-14 ENCOUNTER — Encounter: Payer: Self-pay | Admitting: Family Medicine

## 2018-04-20 DIAGNOSIS — M7061 Trochanteric bursitis, right hip: Secondary | ICD-10-CM | POA: Diagnosis not present

## 2018-04-20 DIAGNOSIS — M25551 Pain in right hip: Secondary | ICD-10-CM | POA: Diagnosis not present

## 2018-04-30 ENCOUNTER — Other Ambulatory Visit: Payer: Self-pay | Admitting: Family Medicine

## 2018-04-30 NOTE — Telephone Encounter (Signed)
Pt was called and she has had no problems getting the 30mg . Pt will call if there are any issues with getting refill next time.

## 2018-04-30 NOTE — Telephone Encounter (Signed)
Please inform patient the following information: I received refill  request for her Remeron 15 mg tab.  That dose had been DC'd and she has an active prescription for the Remeron 30 mg tab was called in at her appointment in January.  Please make patient and pharmacy aware if there is a another prescription there for the higher dose Remeron.

## 2018-05-11 DIAGNOSIS — S73191A Other sprain of right hip, initial encounter: Secondary | ICD-10-CM | POA: Diagnosis not present

## 2018-05-11 DIAGNOSIS — M7061 Trochanteric bursitis, right hip: Secondary | ICD-10-CM | POA: Diagnosis not present

## 2018-05-11 DIAGNOSIS — M25551 Pain in right hip: Secondary | ICD-10-CM | POA: Diagnosis not present

## 2018-05-11 DIAGNOSIS — G894 Chronic pain syndrome: Secondary | ICD-10-CM | POA: Diagnosis not present

## 2018-05-21 ENCOUNTER — Telehealth: Payer: BLUE CROSS/BLUE SHIELD | Admitting: Family

## 2018-05-21 DIAGNOSIS — N390 Urinary tract infection, site not specified: Secondary | ICD-10-CM | POA: Diagnosis not present

## 2018-05-21 DIAGNOSIS — A499 Bacterial infection, unspecified: Secondary | ICD-10-CM | POA: Diagnosis not present

## 2018-05-21 MED ORDER — NITROFURANTOIN MONOHYD MACRO 100 MG PO CAPS: 100.0000 mg | ORAL_CAPSULE | Freq: Two times a day (BID) | ORAL | 0 refills | Status: DC

## 2018-05-21 NOTE — Progress Notes (Signed)

## 2018-06-05 ENCOUNTER — Encounter: Payer: Self-pay | Admitting: Family Medicine

## 2018-06-05 ENCOUNTER — Ambulatory Visit (INDEPENDENT_AMBULATORY_CARE_PROVIDER_SITE_OTHER): Payer: BLUE CROSS/BLUE SHIELD | Admitting: Family Medicine

## 2018-06-05 ENCOUNTER — Other Ambulatory Visit: Payer: Self-pay

## 2018-06-05 VITALS — Ht 65.0 in | Wt 148.0 lb

## 2018-06-05 DIAGNOSIS — B373 Candidiasis of vulva and vagina: Secondary | ICD-10-CM

## 2018-06-05 DIAGNOSIS — B3731 Acute candidiasis of vulva and vagina: Secondary | ICD-10-CM

## 2018-06-05 MED ORDER — FLUCONAZOLE 150 MG PO TABS: 150.0000 mg | ORAL_TABLET | Freq: Once | ORAL | 0 refills | Status: AC

## 2018-06-05 NOTE — Patient Instructions (Signed)
Take Diflucan pill once.  If symptoms are not improving may repeat dose in 3 days.  Vaginal Yeast infection, Adult  Vaginal yeast infection is a condition that causes vaginal discharge as well as soreness, swelling, and redness (inflammation) of the vagina. This is a common condition. Some women get this infection frequently. What are the causes? This condition is caused by a change in the normal balance of the yeast (candida) and bacteria that live in the vagina. This change causes an overgrowth of yeast, which causes the inflammation. What increases the risk? The condition is more likely to develop in women who:  Take antibiotic medicines.  Have diabetes.  Take birth control pills.  Are pregnant.  Douche often.  Have a weak body defense system (immune system).  Have been taking steroid medicines for a long time.  Frequently wear tight clothing. What are the signs or symptoms? Symptoms of this condition include:  White, thick, creamy vaginal discharge.  Swelling, itching, redness, and irritation of the vagina. The lips of the vagina (vulva) may be affected as well.  Pain or a burning feeling while urinating.  Pain during sex. How is this diagnosed? This condition is diagnosed based on:  Your medical history.  A physical exam.  A pelvic exam. Your health care provider will examine a sample of your vaginal discharge under a microscope. Your health care provider may send this sample for testing to confirm the diagnosis. How is this treated? This condition is treated with medicine. Medicines may be over-the-counter or prescription. You may be told to use one or more of the following:  Medicine that is taken by mouth (orally).  Medicine that is applied as a cream (topically).  Medicine that is inserted directly into the vagina (suppository). Follow these instructions at home:  Lifestyle  Do not have sex until your health care provider approves. Tell your sex partner  that you have a yeast infection. That person should go to his or her health care provider and ask if they should also be treated.  Do not wear tight clothes, such as pantyhose or tight pants.  Wear breathable cotton underwear. General instructions  Take or apply over-the-counter and prescription medicines only as told by your health care provider.  Eat more yogurt. This may help to keep your yeast infection from returning.  Do not use tampons until your health care provider approves.  Try taking a sitz bath to help with discomfort. This is a warm water bath that is taken while you are sitting down. The water should only come up to your hips and should cover your buttocks. Do this 3-4 times per day or as told by your health care provider.  Do not douche.  If you have diabetes, keep your blood sugar levels under control.  Keep all follow-up visits as told by your health care provider. This is important. Contact a health care provider if:  You have a fever.  Your symptoms go away and then return.  Your symptoms do not get better with treatment.  Your symptoms get worse.  You have new symptoms.  You develop blisters in or around your vagina.  You have blood coming from your vagina and it is not your menstrual period.  You develop pain in your abdomen. Summary  Vaginal yeast infection is a condition that causes discharge as well as soreness, swelling, and redness (inflammation) of the vagina.  This condition is treated with medicine. Medicines may be over-the-counter or prescription.  Take or  apply over-the-counter and prescription medicines only as told by your health care provider.  Do not douche. Do not have sex or use tampons until your health care provider approves.  Contact a health care provider if your symptoms do not get better with treatment or your symptoms go away and then return. This information is not intended to replace advice given to you by your health  care provider. Make sure you discuss any questions you have with your health care provider. Document Released: 11/07/2004 Document Revised: 06/16/2017 Document Reviewed: 06/16/2017 Elsevier Interactive Patient Education  2019 [employer]

## 2018-06-05 NOTE — Progress Notes (Signed)
VIRTUAL VISIT VIA VIDEO  I connected with Terri [name] on 06/05/18 at  9:00 AM EDT by a video enabled telemedicine application and verified that I am speaking with the correct person using two identifiers. Location patient: Home Location provider: [facility], Office Persons participating in the virtual visit: Patient, Dr. Claiborne Billings and R.Baker, LPN  I discussed the limitations of evaluation and management by telemedicine and the availability of in person appointments. The patient expressed understanding and agreed to proceed.   SUBJECTIVE Chief Complaint  Patient presents with  . Contraception    Pt switched to IUD x1 yr ago and has had chronic UTI's and has a horrible yeast infection now. Pt would like the yeast infection taken care of.     HPI:  Presents today via virtual visit with history of vaginal discharge of 4 days duration.  She endorses vaginal discomfort and itching as well.  She believes she has a yeast infection.  She has a history of HSV, and this is different sensation.  She does have a Mirena IUD and she has concerns that it is causing her to have frequent UTIs and infections.  She did change sexual partners just prior to having a yeast infection.  ROS: See pertinent positives and negatives per HPI.  Patient Active Problem List   Diagnosis Date Noted  . Sleep disorder 09/18/2016  . Genital HSV 08/24/2015  . History of traumatic brain injury 05/30/2015  . Vitamin D deficiency 05/30/2015  . Migraine 01/24/2015  . Allergic rhinitis 12/01/2014  . IBS (irritable bowel syndrome) 10/10/2014  . Acute myofascial pain 05/09/2014  . Trigger point of right side of body 05/09/2014  . Contraceptive management 12/17/2011  . Panic disorder 07/05/2010  . Generalized anxiety disorder 07/05/2010  . Depression with anxiety 07/05/2010  . Right hip pain 07/05/2010    Social History   Tobacco Use  . Smoking status: Former Smoker    Packs/day: 0.30    Types: Cigarettes     Last attempt to quit: 02/12/2007    Years since quitting: 11.3  . Smokeless tobacco: Never Used  Substance Use Topics  . Alcohol use: Yes    Comment: occasional    Current Outpatient Medications:  .  albuterol (PROVENTIL HFA;VENTOLIN HFA) 108 (90 Base) MCG/ACT inhaler, Inhale 2 puffs into the lungs every 6 (six) hours as needed for wheezing or shortness of breath., Disp: 1 Inhaler, Rfl: 0 .  clonazePAM (KLONOPIN) 0.5 MG tablet, Take 1 tablet (0.5 mg total) by mouth 2 (two) times daily as needed for anxiety., Disp: 60 tablet, Rfl: 5 .  HYDROcodone-acetaminophen (NORCO) 10-325 MG tablet, Take 1 tablet by mouth 3 (three) times daily., Disp: , Rfl:  .  mirtazapine (REMERON) 30 MG tablet, Take 1 tablet (30 mg total) by mouth at bedtime., Disp: 90 tablet, Rfl: 1 .  rizatriptan (MAXALT) 10 MG tablet, May repeat in 2 hours if needed--max of 30mg  in 24h period., Disp: 10 tablet, Rfl: 2 .  valACYclovir (VALTREX) 1000 MG tablet, Take 1 tablet (1,000 mg total) by mouth daily. (Patient taking differently: Take 1,000 mg by mouth daily. During outbreaks), Disp: 30 tablet, Rfl: 5 .  venlafaxine XR (EFFEXOR-XR) 150 MG 24 hr capsule, Take 1 capsule (150 mg total) by mouth daily with breakfast. Needs appt prior to any additional refills., Disp: 90 capsule, Rfl: 1 .  vitamin B-12 (CYANOCOBALAMIN) 100 MCG tablet, Take 1 tablet (100 mcg total) by mouth daily., Disp: 60 tablet, Rfl: 0 .  zolpidem (AMBIEN CR) 6.25 MG CR tablet, Take 2 tablets (12.5 mg total) by mouth at bedtime as needed for sleep., Disp: 90 tablet, Rfl: 1  Allergies  Allergen Reactions  . Penicillins Hives and Rash    Has patient had a PCN reaction causing immediate rash, facial/tongue/throat swelling, SOB or lightheadedness with hypotension: Yes Has patient had a PCN reaction causing severe rash involving mucus membranes or skin necrosis: No Has patient had a PCN reaction that required hospitalization: No Has patient had a PCN reaction  occurring within the last 10 years: No If all of the above answers are "NO", then may proceed with Cephalosporin use.;     OBJECTIVE: Ht 5\' 5"  (1.651 m)   Wt 148 lb (67.1 kg)   BMI 24.63 kg/m  Gen: No acute distress. Nontoxic in appearance.  HENT: AT. [place].  MMM.  Eyes: Conjunctiva without redness, discharge or icterus. Chest: Cough or shortness of breath not present.  Neuro: Normal gait. Alert. Oriented x3  Psych: Normal affect, dress and demeanor. Normal speech. Normal thought content and judgment.  ASSESSMENT AND PLAN: Terri [name] is a 33 y.o. female present for  Vaginal yeast infection - HPI consistent with a yeast infection.  - diflucan x1 prescribed, may repeat dose in 3 days if needed only - pt aware if symptoms are not resolved she will need to be seen to have a pelvic exam to rule out other causes.  - f/u prn   Felix Pacinienee Orien Mayhall, DO 06/05/2018

## 2018-06-08 DIAGNOSIS — Z79899 Other long term (current) drug therapy: Secondary | ICD-10-CM | POA: Diagnosis not present

## 2018-06-08 DIAGNOSIS — M25551 Pain in right hip: Secondary | ICD-10-CM | POA: Diagnosis not present

## 2018-06-08 DIAGNOSIS — Z79891 Long term (current) use of opiate analgesic: Secondary | ICD-10-CM | POA: Diagnosis not present

## 2018-06-08 DIAGNOSIS — M7061 Trochanteric bursitis, right hip: Secondary | ICD-10-CM | POA: Diagnosis not present

## 2018-06-08 DIAGNOSIS — S73191A Other sprain of right hip, initial encounter: Secondary | ICD-10-CM | POA: Diagnosis not present

## 2018-06-08 DIAGNOSIS — G894 Chronic pain syndrome: Secondary | ICD-10-CM | POA: Diagnosis not present

## 2018-06-30 ENCOUNTER — Other Ambulatory Visit: Payer: Self-pay

## 2018-06-30 ENCOUNTER — Encounter: Payer: Self-pay | Admitting: Family Medicine

## 2018-06-30 ENCOUNTER — Ambulatory Visit (INDEPENDENT_AMBULATORY_CARE_PROVIDER_SITE_OTHER): Payer: BLUE CROSS/BLUE SHIELD | Admitting: Family Medicine

## 2018-06-30 ENCOUNTER — Telehealth: Payer: BLUE CROSS/BLUE SHIELD | Admitting: Physician Assistant

## 2018-06-30 VITALS — Temp 97.4°F | Ht 65.0 in

## 2018-06-30 DIAGNOSIS — R109 Unspecified abdominal pain: Secondary | ICD-10-CM

## 2018-06-30 DIAGNOSIS — R350 Frequency of micturition: Secondary | ICD-10-CM

## 2018-06-30 DIAGNOSIS — R300 Dysuria: Secondary | ICD-10-CM

## 2018-06-30 MED ORDER — CIPROFLOXACIN HCL 500 MG PO TABS: 500.0000 mg | ORAL_TABLET | Freq: Two times a day (BID) | ORAL | 0 refills | Status: DC

## 2018-06-30 NOTE — Patient Instructions (Signed)

## 2018-06-30 NOTE — Progress Notes (Signed)
VIRTUAL VISIT VIA VIDEO  I connected with Grant L Both on 06/30/18 at  1:00 PM EDT by a video enabled telemedicine application and verified that I am speaking with the correct person using two identifiers. Location patient: Home Location provider: [facility], Office Persons participating in the virtual visit: Patient, Dr. Claiborne Billings and R.Baker, LPN  I discussed the limitations of evaluation and management by telemedicine and the availability of in person appointments. The patient expressed understanding and agreed to proceed.   SUBJECTIVE Chief Complaint  Patient presents with  . Urinary Frequency    Pt did E visit and was told to go to provider. She did not go to urgent care and called to set up virtual visit.   . Flank Pain    x2 days. Denies fever.     HPI:  Terri [name] is a 33 y.o. female present for urinary frequency and flank pain.  She states she feels as though she has a urinary tract infection.  She is having frequency of urination, incomplete bladder emptying, urinary urgency, lower abdominal cramping and burning with urination.  She also endorses some mild low back discomfort.  She denies any fever, chills, nausea or vomiting.  She denies any hematuria.  She denies any decreased urine production.  He has not tried anything over-the-counter for her symptoms.  She has been having more increase in urinary sections over the last few months since her new partner.  ROS: See pertinent positives and negatives per HPI.  Patient Active Problem List   Diagnosis Date Noted  . Sleep disorder 09/18/2016  . Genital HSV 08/24/2015  . History of traumatic brain injury 05/30/2015  . Vitamin D deficiency 05/30/2015  . Migraine 01/24/2015  . Allergic rhinitis 12/01/2014  . IBS (irritable bowel syndrome) 10/10/2014  . Acute myofascial pain 05/09/2014  . Trigger point of right side of body 05/09/2014  . Contraceptive management 12/17/2011  . Panic disorder 07/05/2010  .  Generalized anxiety disorder 07/05/2010  . Depression with anxiety 07/05/2010  . Right hip pain 07/05/2010    Social History   Tobacco Use  . Smoking status: Former Smoker    Packs/day: 0.30    Types: Cigarettes    Last attempt to quit: 02/12/2007    Years since quitting: 11.3  . Smokeless tobacco: Never Used  Substance Use Topics  . Alcohol use: Yes    Comment: occasional    Current Outpatient Medications:  .  albuterol (PROVENTIL HFA;VENTOLIN HFA) 108 (90 Base) MCG/ACT inhaler, Inhale 2 puffs into the lungs every 6 (six) hours as needed for wheezing or shortness of breath., Disp: 1 Inhaler, Rfl: 0 .  clonazePAM (KLONOPIN) 0.5 MG tablet, Take 1 tablet (0.5 mg total) by mouth 2 (two) times daily as needed for anxiety., Disp: 60 tablet, Rfl: 5 .  HYDROcodone-acetaminophen (NORCO) 10-325 MG tablet, Take 1 tablet by mouth 3 (three) times daily., Disp: , Rfl:  .  mirtazapine (REMERON) 30 MG tablet, Take 1 tablet (30 mg total) by mouth at bedtime., Disp: 90 tablet, Rfl: 1 .  rizatriptan (MAXALT) 10 MG tablet, May repeat in 2 hours if needed--max of 30mg  in 24h period., Disp: 10 tablet, Rfl: 2 .  valACYclovir (VALTREX) 1000 MG tablet, Take 1 tablet (1,000 mg total) by mouth daily. (Patient taking differently: Take 1,000 mg by mouth daily. During outbreaks), Disp: 30 tablet, Rfl: 5 .  venlafaxine XR (EFFEXOR-XR) 150 MG 24 hr capsule, Take 1 capsule (150 mg total) by mouth daily  with breakfast. Needs appt prior to any additional refills., Disp: 90 capsule, Rfl: 1 .  vitamin B-12 (CYANOCOBALAMIN) 100 MCG tablet, Take 1 tablet (100 mcg total) by mouth daily., Disp: 60 tablet, Rfl: 0 .  zolpidem (AMBIEN CR) 6.25 MG CR tablet, Take 2 tablets (12.5 mg total) by mouth at bedtime as needed for sleep., Disp: 90 tablet, Rfl: 1  Allergies  Allergen Reactions  . Penicillins Hives and Rash    Has patient had a PCN reaction causing immediate rash, facial/tongue/throat swelling, SOB or lightheadedness with  hypotension: Yes Has patient had a PCN reaction causing severe rash involving mucus membranes or skin necrosis: No Has patient had a PCN reaction that required hospitalization: No Has patient had a PCN reaction occurring within the last 10 years: No If all of the above answers are "NO", then may proceed with Cephalosporin use.;     OBJECTIVE: Temp (!) 97.4 F (36.3 C) (Oral)   Ht 5\' 5"  (1.651 m)   BMI 24.63 kg/m  Gen: No acute distress. Nontoxic in appearance.  HENT: AT. [place].  MMM.  Eyes:Pupils Equal Round Reactive to light, Extraocular movements intact,  Conjunctiva without redness, discharge or icterus. Chest: Cough or shortness of breath not present Skin: no rashes, purpura or petechiae.  Neuro:  Normal gait. Alert. Oriented x3  Psych: Normal affect, dress and demeanor. Normal speech. Normal thought content and judgment.  ASSESSMENT AND PLAN: Terri [name] is a 33 y.o. female present for  Urinary frequency -She has had more frequent urinary tract infections reported over the last few months. -She is allergic to penicillin. -She will come by the office today to produce a urinary specimen which we will send for urinalysis with reflex to culture. -Start Cipro twice daily for 5 days. - Urinalysis w microscopic + reflex cultur; Future -Rest and hydrate hydrate hydrate. -If proven UTI as cause of symptoms, would consider Macrobid postcoital prophylaxis. -Follow-up dependent upon results.  > 15 minutes spent with patient, >50% of time spent face to face counseling    Felix PaciniRenee Lisia [place], DO 06/30/2018

## 2018-06-30 NOTE — Progress Notes (Signed)
Based on what you shared with me, I feel your condition warrants further evaluation and I recommend that you be seen for a face to face office visit.     NOTE: If you entered your credit card information for this eVisit, you will not be charged. You may see a "hold" on your card for the $35 but that hold will drop off and you will not have a charge processed.  If you are having a true medical emergency please call 911.  If you need an urgent face to face visit, [facility] has four urgent care centers for your convenience.    PLEASE NOTE: THE [facility] LOCATIONS AND URGENT CARE CLINICS DO NOT HAVE THE TESTING FOR CORONAVIRUS COVID19 AVAILABLE.  IF YOU FEEL YOU NEED THIS TEST YOU MUST GO TO A TRIAGE LOCATION AT ONE OF THE HOSPITAL EMERGENCY DEPARTMENTS ?  [email or web address] to reserve your spot online an avoid wait times  [facility] [address], [address] [place], [place] [address] Modified hours of operation: Monday-Friday, 12 PM to 6 PM  Saturday & Sunday 10 AM to 4 PM *Across the street from [employer]  [facility] (New Address!) [address], [address] [place], [place] [address] *Just off [address], across the road from [employer]* Modified hours of operation: Monday-Friday, 12 PM to 6 PM  Closed Saturday & Sunday  [facility]'s modified hours of operation will be in effect from May 1 until May 31   The following sites will take your insurance:  [facility]  [phone] Get Driving Directions Find a Provider at this Location  [address] [place], [place] [address] 10 am to 8 pm Monday-Friday 12 pm to 8 pm [facility]   [facility]  [phone] Get Driving Directions Find a Provider at this Location  [address] [place], [place] [address] 8 am to 8 pm Monday-Friday 9 am to 6 pm Saturday 11 am to 6 pm Sunday   [facility]  at [facility]  [phone] Get Driving Directions  [address] [place], [place] [address] 8 am to 8 pm Monday-Friday 8 am to 4 pm Saturday-Sunday   Your e-visit answers were reviewed by a board certified advanced clinical practitioner to complete your personal care plan.  Thank you for using e-Visits.  ===View-only below this line===   ----- Message -----    From: Terri [name]    Sent: 06/30/2018  8:30 AM EDT      To: E-Visit Mailing List Subject: E-Visit Submission: Urinary Problems  E-Visit Submission: Urinary Problems --------------------------------  Question: Which of the following are you experiencing? Answer:   Pain while passing urine            Difficulty passing urine            Change in urine appearance or smell  Question: When you have pain when passing urine, which of these apply? Answer:   The pain feels like it is on the inside  Question: Are you able to pass urine? Answer:   Yes, I can pass urine with difficulty  Question: How long have you had pain or difficulty passing urine? Answer:   Two days or less  Question: Do you have a fever? Answer:   No, I do not have a fever  Question: Do you have any of the following? Answer:   I have back pain with this illness  I have belly pain with this illness  Question: Do you have an exaggerated sensation of the need to pass urine? Answer:   Yes, the sensation is exaggerated  Question: Do you have the urge to urinate more of less frequently than normal? Answer:   More frequently  Question: What does your urine look like? Answer:   I am not sure  Question: Do you have any of the following? Answer:   An unusual smell  Question: Do you have any of the following? Answer:   A white discharge  Question: Do you have any sores on your genitals? Answer:   No  Question: Do you have any history of kidney dysfunction or kidney problems? Answer:   No  Question: Within the past 3  months, have you had any surgery on your kidneys or bladder, or have you had a tube inserted to collect your urine? Answer:   No, I have never had either  Question: Have you had similar symptoms in the past? Answer:   Yes, I have these symptoms frequently  Question: If you had similar symptoms in the past, did any of the following work? Answer:   Pills for urine infection            Cranberry juice  Question: Please list any additional comments  Answer:   The last time I was dealing with these types of symptoms I had a UTI that caused a minor kidney infection. I am unable to do anything at this point. I have been in my restroom for 4 hours.  Question: Please list your medication allergies that you may have ? (If 'none' , please list as 'none') Answer:   Penicillin  Question: Are you pregnant? Answer:   I am confident that I am not pregnant  Question: Are you breastfeeding? Answer:   No  A total of 5-10 minutes was spent evaluating this patients questionnaire and formulating a plan of care.

## 2018-07-02 ENCOUNTER — Telehealth: Payer: Self-pay | Admitting: Family Medicine

## 2018-07-02 DIAGNOSIS — N390 Urinary tract infection, site not specified: Secondary | ICD-10-CM | POA: Insufficient documentation

## 2018-07-02 LAB — URINALYSIS W MICROSCOPIC + REFLEX CULTURE
Bilirubin Urine: NEGATIVE
Glucose, UA: NEGATIVE
Hyaline Cast: NONE SEEN /LPF
Nitrites, Initial: POSITIVE — AB
Specific Gravity, Urine: 1.017 (ref 1.001–1.03)
pH: <=5 (ref 5.0–8.0)

## 2018-07-02 LAB — URINE CULTURE
MICRO NUMBER:: 491517
SPECIMEN QUALITY:: ADEQUATE

## 2018-07-02 LAB — CULTURE INDICATED

## 2018-07-02 MED ORDER — NITROFURANTOIN MONOHYD MACRO 100 MG PO CAPS: ORAL_CAPSULE | ORAL | 5 refills | Status: DC

## 2018-07-02 NOTE — Telephone Encounter (Signed)
Please inform patient the following information: Her urine culture had minimal bacteria. Would encourage her to continue the abx to completion if symptoms do not resolve to follow up with her gyn for a pelvic exam.  I have also called in abx to help prevent frequent UTI. It is taken one pill after intercourse.

## 2018-07-02 NOTE — Telephone Encounter (Signed)
Pt was called and was unable to leave VM, will send my chart message.

## 2018-07-03 NOTE — Telephone Encounter (Signed)
Pt was called. VM was left advising pt to check my chart for results and instructions/recommendations

## 2018-07-08 DIAGNOSIS — M25551 Pain in right hip: Secondary | ICD-10-CM | POA: Diagnosis not present

## 2018-07-08 DIAGNOSIS — G894 Chronic pain syndrome: Secondary | ICD-10-CM | POA: Diagnosis not present

## 2018-07-08 DIAGNOSIS — M7061 Trochanteric bursitis, right hip: Secondary | ICD-10-CM | POA: Diagnosis not present

## 2018-07-08 DIAGNOSIS — S73191A Other sprain of right hip, initial encounter: Secondary | ICD-10-CM | POA: Diagnosis not present

## 2018-08-05 DIAGNOSIS — M7061 Trochanteric bursitis, right hip: Secondary | ICD-10-CM | POA: Diagnosis not present

## 2018-08-05 DIAGNOSIS — M25551 Pain in right hip: Secondary | ICD-10-CM | POA: Diagnosis not present

## 2018-08-05 DIAGNOSIS — G894 Chronic pain syndrome: Secondary | ICD-10-CM | POA: Diagnosis not present

## 2018-08-05 DIAGNOSIS — S73191A Other sprain of right hip, initial encounter: Secondary | ICD-10-CM | POA: Diagnosis not present

## 2018-11-04 ENCOUNTER — Ambulatory Visit: Payer: BLUE CROSS/BLUE SHIELD | Admitting: Family Medicine

## 2018-11-04 ENCOUNTER — Telehealth: Payer: Self-pay | Admitting: Family Medicine

## 2018-11-04 NOTE — Telephone Encounter (Signed)
Patient is requesting today's appointment to be in person. She has not been tested for COVID and has had no exposure. She is not running a fever. Her throat is very sore and she only has one more day of antibiotics.

## 2018-11-04 NOTE — Telephone Encounter (Signed)
Pt was called and will come to the office for appt

## 2018-11-04 NOTE — Telephone Encounter (Signed)
Ok to come in office - please call to request the records.

## 2018-11-04 NOTE — Telephone Encounter (Signed)
Pt can cancel if she would like. Please cancel.

## 2018-11-04 NOTE — Telephone Encounter (Signed)
Pt scheduled for virtual appt at 1620. Strep test completed at urgent care, was positive per patient. Zpack was given and she has one more day left of medications. Pt is having difficulty getting fluids and food down to throat pain. Pt is wanting to come in for visit to have her throat looked at but will do VV if she is unable to come into the office.  [place] requested

## 2018-11-04 NOTE — Telephone Encounter (Signed)
Patient called and said she is feeling better. She is asking if she should cancel her appointment.  Records from [facility] have been received

## 2018-11-12 ENCOUNTER — Other Ambulatory Visit: Payer: Self-pay

## 2018-11-12 ENCOUNTER — Telehealth: Payer: Self-pay | Admitting: Family Medicine

## 2018-11-12 ENCOUNTER — Telehealth: Payer: Self-pay | Admitting: Physician Assistant

## 2018-11-12 DIAGNOSIS — Z8742 Personal history of other diseases of the female genital tract: Secondary | ICD-10-CM

## 2018-11-12 MED ORDER — VALACYCLOVIR HCL 1 G PO TABS: 1000.0000 mg | ORAL_TABLET | Freq: Every day | ORAL | 0 refills | Status: DC

## 2018-11-12 NOTE — Telephone Encounter (Signed)
Filled patients medication for 30 days. Advised if patient is not feeling better within a couple days to call back and schedule a office visit. Patient verbalized understanding. Stated that this outbreak is pretty bad and has not had mediation in a long time to help

## 2018-11-12 NOTE — Telephone Encounter (Signed)
valACYclovir (VALTREX) 1000 MG tablet [[id number]]  Please call [place]

## 2018-11-12 NOTE — Progress Notes (Signed)
Based on what you shared with me, I feel your condition warrants further evaluation and I recommend that you be seen for a face to face office visit. Vaginal herpes is not something we treat via e-visit. I would recommend contacting your PCP office to set up a video visit for assessment and treatment.   NOTE: If you entered your credit card information for this eVisit, you will not be charged. You may see a "hold" on your card for the $35 but that hold will drop off and you will not have a charge processed.  If you are having a true medical emergency please call 911.     For an urgent face to face visit, [facility] has four urgent care centers for your convenience:   . [facility]    [phone]                  Get Driving Directions  [address], [place] [address] . 10 am to 8 pm Monday-Friday . 12 pm to 8 pm Saturday-Sunday   . [facility] [place]                  Get Driving Directions  [address], [place], [place] [address] . 8 am to 8 pm Monday-Friday . 9 am to 6 pm Saturday . 11 am to 6 pm Sunday   . [facility] [place]                  Get Driving Directions   [address] [place], [place] [address] . 8 am to 8 pm Monday-Friday . 8 am to 4 pm Saturday-Sunday    . [facility] [place]                    Get Driving Directions  [phone]  [address] [place], [place] [address]  . Monday-Friday, 12 PM to 6 PM    Your e-visit answers were reviewed by a board certified advanced clinical practitioner to complete your personal care plan.  Thank you for using e-Visits.

## 2018-11-13 ENCOUNTER — Other Ambulatory Visit: Payer: Self-pay

## 2018-11-13 ENCOUNTER — Emergency Department (HOSPITAL_COMMUNITY)
Admission: EM | Admit: 2018-11-13 | Discharge: 2018-11-13 | Disposition: A | Discharge status: Home / Self Care | Payer: 59 | Attending: Emergency Medicine | Admitting: Emergency Medicine

## 2018-11-13 ENCOUNTER — Encounter (HOSPITAL_COMMUNITY): Payer: Self-pay

## 2018-11-13 DIAGNOSIS — Z79899 Other long term (current) drug therapy: Secondary | ICD-10-CM | POA: Insufficient documentation

## 2018-11-13 DIAGNOSIS — Z87891 Personal history of nicotine dependence: Secondary | ICD-10-CM | POA: Diagnosis not present

## 2018-11-13 DIAGNOSIS — R197 Diarrhea, unspecified: Secondary | ICD-10-CM | POA: Diagnosis present

## 2018-11-13 DIAGNOSIS — K921 Melena: Secondary | ICD-10-CM | POA: Diagnosis not present

## 2018-11-13 LAB — URINALYSIS, ROUTINE W REFLEX MICROSCOPIC
Bilirubin Urine: NEGATIVE
Glucose, UA: NEGATIVE mg/dL
Ketones, ur: NEGATIVE mg/dL
Leukocytes,Ua: NEGATIVE
Nitrite: NEGATIVE
Protein, ur: NEGATIVE mg/dL
Specific Gravity, Urine: 1.006 (ref 1.005–1.030)
pH: 7 (ref 5.0–8.0)

## 2018-11-13 LAB — POC OCCULT BLOOD, ED: Fecal Occult Bld: NEGATIVE

## 2018-11-13 LAB — I-STAT BETA HCG BLOOD, ED (MC, WL, AP ONLY): I-stat hCG, quantitative: <5 m[IU]/mL (ref <5)

## 2018-11-13 MED ORDER — LOPERAMIDE HCL 2 MG PO CAPS: 2.0000 mg | ORAL_CAPSULE | Freq: Four times a day (QID) | ORAL | 0 refills | Status: DC | PRN

## 2018-11-13 NOTE — ED Provider Notes (Signed)
[facility]-EMERGENCY DEPT Provider Note   CSN: [id number] Arrival date & time: 11/13/18  1010     History   Chief Complaint No chief complaint on file.   HPI Terri [name] is a 33 y.o. female.     HPI Patient reports he had 2 bowel movements this morning.  They were both diarrheal.  She reports there was bright red blood in both episodes.  No clots.  She denies she is having any abdominal pain.  She reports she has been having a very focal area of low central back pain for a day or 2.  No flank pain.  She reports she is currently having a flareup of her genital herpes.  For this reason it is making things more uncomfortable than usual.  She does not note that she is having any vaginal bleeding or blood in the urine however.  She reports that she has been eating less recently.  She reports she had [employer] yesterday and did not eat anything else.  She had [employer] pizza the day before and did not eat anything else that day either.  No fevers, no vomiting. Past Medical History:  Diagnosis Date  . Allergy   . Brain injury [facility]) 33 yrs old   fell off babysitters cabinet, and on a separate occasion she fell down a flight of stairs  . Closed head injury About age 34 yrs   ICH per pt, with seizure disorder for appox 2 yrs afterward.  No significant residual disability.  . Depression with anxiety    Failed buspar.  Did well on citalopram.  . Drug overdose 2004   Xanax  . H/O varicella   . History of chlamydia   . HSV infection    Genital clx pos HSV 2--2007.  Marland Kitchen Hx of migraines    chronic migraines (Dr. Everlena Cooper) 03/2015  . Hx: UTI (urinary tract infection) 09/06/10  . IBS (irritable bowel syndrome)   . Right hip pain 05/2009   MRI 10/12/09: Femoral head and neck stress injury plus small acetabular labral tear.  [facility] ortho (Dr. Caswell Corwin) as of 11/2012.  Preferred pain mgmt: steroid injection into right subtrochanteric bursa 12/06/15.  . Seizure [facility])    for 2 yrs after  closed head injury/ICH    Patient Active Problem List   Diagnosis Date Noted  . Frequent UTI 07/02/2018  . Sleep disorder 09/18/2016  . Genital HSV 08/24/2015  . History of traumatic brain injury 05/30/2015  . Vitamin D deficiency 05/30/2015  . Migraine 01/24/2015  . Allergic rhinitis 12/01/2014  . IBS (irritable bowel syndrome) 10/10/2014  . Acute myofascial pain 05/09/2014  . Trigger point of right side of body 05/09/2014  . Contraceptive management 12/17/2011  . Panic disorder 07/05/2010  . Generalized anxiety disorder 07/05/2010  . Depression with anxiety 07/05/2010  . Right hip pain 07/05/2010    Past Surgical History:  Procedure Laterality Date  . HIP SURGERY  10/10/2010   Right hip GT bursectomy and osteophytectomy for intractable right greater troch pain (Dr. Despina Hick).  [facility] second opinion 06/2012--resulted in repeat MRI, then right hip IA injection that gave mild short term response, then was referred to PT with iontophoresis (as of 09/07/12)--was told to f/u in 6 wks.  Marland Kitchen MOLE REMOVAL     on back  . WISDOM TOOTH EXTRACTION       OB History    Gravida  2   Para  2   Term  2   Preterm  AB      Living  1     SAB      TAB      Ectopic      Multiple      Live Births  1            Home Medications    Prior to Admission medications   Medication Sig Start Date End Date Taking? Authorizing Provider  clonazePAM (KLONOPIN) 0.5 MG tablet Take 1 tablet (0.5 mg total) by mouth 2 (two) times daily as needed for anxiety. 03/13/18  Yes Kuneff, Renee A, DO  valACYclovir (VALTREX) 1000 MG tablet Take 1 tablet (1,000 mg total) by mouth daily. 11/12/18  Yes Kuneff, Renee A, DO  zolpidem (AMBIEN CR) 6.25 MG CR tablet Take 2 tablets (12.5 mg total) by mouth at bedtime as needed for sleep. 03/13/18  Yes Kuneff, Renee A, DO  albuterol (PROVENTIL HFA;VENTOLIN HFA) 108 (90 Base) MCG/ACT inhaler Inhale 2 puffs into the lungs every 6 (six) hours as needed for wheezing or  shortness of breath. Patient not taking: Reported on 11/13/2018 05/30/15   Howard Pouch A, DO  ciprofloxacin (CIPRO) 500 MG tablet Take 1 tablet (500 mg total) by mouth 2 (two) times daily. Patient not taking: Reported on 11/13/2018 06/30/18   Howard Pouch A, DO  HYDROcodone-acetaminophen (NORCO) 10-325 MG tablet Take 1 tablet by mouth 3 (three) times daily. 03/04/18   [provider]  loperamide (IMODIUM) 2 MG capsule Take 1 capsule (2 mg total) by mouth 4 (four) times daily as needed for diarrhea or loose stools. 11/13/18   Charlesetta Shanks, MD  mirtazapine (REMERON) 30 MG tablet Take 1 tablet (30 mg total) by mouth at bedtime. Patient not taking: Reported on 11/13/2018 03/13/18   Howard Pouch A, DO  nitrofurantoin, macrocrystal-monohydrate, (MACROBID) 100 MG capsule 1 capsule PO qhs prn after intercourse Patient not taking: Reported on 11/13/2018 07/02/18   Howard Pouch A, DO  rizatriptan (MAXALT) 10 MG tablet May repeat in 2 hours if needed--max of 30mg  in 24h period. Patient not taking: Reported on 11/13/2018 12/20/15   Howard Pouch A, DO  venlafaxine XR (EFFEXOR-XR) 150 MG 24 hr capsule Take 1 capsule (150 mg total) by mouth daily with breakfast. Needs appt prior to any additional refills. Patient not taking: Reported on 11/13/2018 03/13/18   Howard Pouch A, DO  vitamin B-12 (CYANOCOBALAMIN) 100 MCG tablet Take 1 tablet (100 mcg total) by mouth daily. Patient not taking: Reported on 11/13/2018 01/25/15   Ma Hillock, DO    Family History Family History  Problem Relation Age of Onset  . Breast cancer Mother 55  . Thyroid cancer Mother   . Hepatitis Father        Hep C  . Alcohol abuse Father   . Thyroid disease Sister   . Anxiety disorder Sister   . Thyroid cancer Sister   . Thrombophlebitis Maternal Grandfather   . Anxiety disorder Maternal Grandfather   . Heart disease Maternal Grandfather   . Alzheimer's disease Paternal Grandmother     Social History Social History    Tobacco Use  . Smoking status: Former Smoker    Packs/day: 0.30    Types: Cigarettes    Quit date: 02/12/2007    Years since quitting: 11.7  . Smokeless tobacco: Never Used  Substance Use Topics  . Alcohol use: Yes    Comment: occasional  . Drug use: No     Allergies   Penicillins   Review of  Systems Review of Systems 10 Systems reviewed and are negative for acute change except as noted in the HPI.   Physical Exam Updated Vital Signs BP 95/61 (BP Location: Left Arm)   Pulse 76   Temp 98.3 F (36.8 C) (Oral)   Resp 16   SpO2 99%   Physical Exam Constitutional:      Appearance: She is well-developed.  HENT:     Head: Normocephalic and atraumatic.  Eyes:     Extraocular Movements: Extraocular movements intact.     Conjunctiva/sclera: Conjunctivae normal.  Neck:     Musculoskeletal: Neck supple.  Cardiovascular:     Rate and Rhythm: Normal rate and regular rhythm.     Heart sounds: Normal heart sounds.  Pulmonary:     Effort: Pulmonary effort is normal.     Breath sounds: Normal breath sounds.  Abdominal:     General: Bowel sounds are normal. There is no distension.     Palpations: Abdomen is soft.     Tenderness: There is no abdominal tenderness.  Genitourinary:    Comments: Normal perianal visual exam.  No blood or lesions.  No hemorrhoids.  Digital exam no internal masses or fullness.  Mucousy yellow stool in the vault.  No visible blood. Musculoskeletal: Normal range of motion.  Skin:    General: Skin is warm and dry.  Neurological:     Mental Status: She is alert and oriented to person, place, and time.     GCS: GCS eye subscore is 4. GCS verbal subscore is 5. GCS motor subscore is 6.     Coordination: Coordination normal.  Psychiatric:        Mood and Affect: Mood normal.      ED Treatments / Results  Labs (all labs ordered are listed, but only abnormal results are displayed) Labs Reviewed  URINALYSIS, ROUTINE W REFLEX MICROSCOPIC - Abnormal;  Notable for the following components:      Result Value   APPearance CLOUDY (*)    Hgb urine dipstick LARGE (*)    Bacteria, UA RARE (*)    All other components within normal limits  I-STAT BETA HCG BLOOD, ED ([facility], [facility], [facility] ONLY)  POC OCCULT BLOOD, ED    EKG None  Radiology No results found.  Procedures Procedures (including critical care time)  Medications Ordered in ED Medications - No data to display   Initial Impression / Assessment and Plan / ED Course  I have reviewed the triage vital signs and the nursing notes.  Pertinent labs & imaging results that were available during my care of the patient were reviewed by me and considered in my medical decision making (see chart for details).       Patient reports 2 episodes of diarrhea today.  She has not had fever or abdominal pain.  She reports she saw blood.  At this time no visible blood.  No clinical perianal or rectal exam abnormalities.  Stool is testing heme-negative.  At this time, I feel patient is safe for conservative home treatment for diarrheal illness.  Recommended follow-up with PCP.  Final Clinical Impressions(s) / ED Diagnoses   Final diagnoses:  Diarrhea, unspecified type    ED Discharge Orders         Ordered    loperamide (IMODIUM) 2 MG capsule  4 times daily PRN     11/13/18 1312           Arby BarrettePfeiffer, Candid Bovey, MD 11/13/18 1315

## 2018-11-13 NOTE — Telephone Encounter (Signed)
Advised patient to go to ED, patient agreeable to plan.

## 2018-11-13 NOTE — ED Triage Notes (Signed)
Pt from home.  Pt c/o of pain in her R lower back that started Tuesday then stopped, and started back last night.  Pt stated she began having diarrhea with bright red blood this morning.  Pt states the pain is a 7/10, is constant and pinpoint and does not move.  Pt stated she recently had strep, finished her ABX yesterday and is still having pain and swelling in her throat.  Pt stated she also just had a flare up of genitial herpes and began taking her antiviral for it yesterday.  Pt also just moved out her previous house 9/21 due to a bad mold infestation.

## 2018-11-13 NOTE — Telephone Encounter (Signed)
Patient is having bloody diarrhea this morning with lower back pain. Please advise.

## 2018-11-13 NOTE — Discharge Instructions (Addendum)
1.  Stay hydrated.  Drink plenty of fluids.  Try to make sure fluids have some electrolytes in them.  You may take Imodium as prescribed for diarrhea. 2.  Follow-up with your doctor this week. 3.  Return to emergency department if you develop fevers, abdominal pain, new or worsening symptoms.

## 2018-11-24 ENCOUNTER — Ambulatory Visit (INDEPENDENT_AMBULATORY_CARE_PROVIDER_SITE_OTHER): Payer: 59 | Admitting: Family Medicine

## 2018-11-24 ENCOUNTER — Other Ambulatory Visit: Payer: Self-pay

## 2018-11-24 ENCOUNTER — Encounter: Payer: Self-pay | Admitting: Family Medicine

## 2018-11-24 VITALS — BP 95/65 | HR 87 | Temp 97.6°F | Resp 16 | Ht 65.0 in | Wt 129.0 lb

## 2018-11-24 DIAGNOSIS — R6881 Early satiety: Secondary | ICD-10-CM | POA: Diagnosis not present

## 2018-11-24 DIAGNOSIS — R197 Diarrhea, unspecified: Secondary | ICD-10-CM | POA: Diagnosis not present

## 2018-11-24 DIAGNOSIS — R1030 Lower abdominal pain, unspecified: Secondary | ICD-10-CM

## 2018-11-24 DIAGNOSIS — Z23 Encounter for immunization: Secondary | ICD-10-CM

## 2018-11-24 LAB — TSH: TSH: 1.72 u[IU]/mL (ref 0.35–4.50)

## 2018-11-24 LAB — CBC WITH DIFFERENTIAL/PLATELET
Basophils Absolute: 0.1 10*3/uL (ref 0.0–0.1)
Basophils Relative: 0.8 % (ref 0.0–3.0)
Eosinophils Absolute: 0.1 10*3/uL (ref 0.0–0.7)
Eosinophils Relative: 1 % (ref 0.0–5.0)
HCT: 36.5 % (ref 36.0–46.0)
Hemoglobin: 12.1 g/dL (ref 12.0–15.0)
Lymphocytes Relative: 30.5 % (ref 12.0–46.0)
Lymphs Abs: 2.3 10*3/uL (ref 0.7–4.0)
MCHC: 33.1 g/dL (ref 30.0–36.0)
MCV: 93.5 fl (ref 78.0–100.0)
Monocytes Absolute: 1 10*3/uL (ref 0.1–1.0)
Monocytes Relative: 13.1 % — ABNORMAL HIGH (ref 3.0–12.0)
Neutro Abs: 4.2 10*3/uL (ref 1.4–7.7)
Neutrophils Relative %: 54.6 % (ref 43.0–77.0)
Platelets: 358 10*3/uL (ref 150.0–400.0)
RBC: 3.91 Mil/uL (ref 3.87–5.11)
RDW: 13.7 % (ref 11.5–15.5)
WBC: 7.6 10*3/uL (ref 4.0–10.5)

## 2018-11-24 LAB — SEDIMENTATION RATE: Sed Rate: 24 mm/hr — ABNORMAL HIGH (ref 0–20)

## 2018-11-24 LAB — COMPREHENSIVE METABOLIC PANEL
ALT: 11 U/L (ref 0–35)
AST: 11 U/L (ref 0–37)
Albumin: 4.2 g/dL (ref 3.5–5.2)
Alkaline Phosphatase: 56 U/L (ref 39–117)
BUN: 8 mg/dL (ref 6–23)
CO2: 30 mEq/L (ref 19–32)
Calcium: 9.6 mg/dL (ref 8.4–10.5)
Chloride: 105 mEq/L (ref 96–112)
Creatinine, Ser: 0.64 mg/dL (ref 0.40–1.20)
GFR: 106.84 mL/min (ref >60.00)
Glucose, Bld: 93 mg/dL (ref 70–99)
Potassium: 4.8 mEq/L (ref 3.5–5.1)
Sodium: 140 mEq/L (ref 135–145)
Total Bilirubin: 0.4 mg/dL (ref 0.2–1.2)
Total Protein: 7.4 g/dL (ref 6.0–8.3)

## 2018-11-24 LAB — C-REACTIVE PROTEIN: CRP: <1 mg/dL (ref 0.5–20.0)

## 2018-11-24 LAB — LIPASE: Lipase: 27 U/L (ref 11.0–59.0)

## 2018-11-24 LAB — H. PYLORI ANTIBODY, IGG: H Pylori IgG: NEGATIVE

## 2018-11-24 MED ORDER — OMEPRAZOLE 20 MG PO CPDR
20.0000 mg | DELAYED_RELEASE_CAPSULE | Freq: Every day | ORAL | 3 refills | Status: DC
Start: 2018-11-24 — End: 2019-02-23

## 2018-11-24 MED ORDER — SUCRALFATE 1 G PO TABS: 1.0000 g | ORAL_TABLET | Freq: Three times a day (TID) | ORAL | 0 refills | Status: DC

## 2018-11-24 NOTE — Progress Notes (Signed)
SUBJECTIVE Chief Complaint  Patient presents with  . Diarrhea    Diarrhea has stopped and has not seen anymore blood in stool but does not have appetite.     HPI:  Terri [name] is a 33 y.o. female present for ED follow up after being seen 11/13/2018 for diarrhea and back pain.  Patient reports the diarrhea had continued a few days after being seen in the emergency room but since has stopped approximately 3 days ago.  She had not had any repeat bright red blood per rectum.  She states she does have an appetite, and she is hungry, but when she eats she just gets full and becomes disinterested.  She denies any nausea, vomit, fever.  She did have strep throat in which she was treated for antibiotics over this course of time.  [place] recently had to move back to the area.  She reports she has lost a great deal of weight over the last 4 weeks since her illness.  By EMR review she has lost 19 pounds over the course of this time.  She denies daily alcohol use states she drinks about 3 times a month consists of maybe 4 beers at each sitting.  Patient has a past medical history of irritable bowel syndrome, she does not recall much of this.  She denies any family history of irritable bowel disease or gastric cancers.  There is a family history of thyroid disease/cancer.  ROS: See pertinent positives and negatives per HPI.  Patient Active Problem List   Diagnosis Date Noted  . Frequent UTI 07/02/2018  . Sleep disorder 09/18/2016  . Genital HSV 08/24/2015  . History of traumatic brain injury 05/30/2015  . Vitamin D deficiency 05/30/2015  . Migraine 01/24/2015  . Allergic rhinitis 12/01/2014  . IBS (irritable bowel syndrome) 10/10/2014  . Acute myofascial pain 05/09/2014  . Trigger point of right side of body 05/09/2014  . Contraceptive management 12/17/2011  . Panic disorder 07/05/2010  . Generalized anxiety  disorder 07/05/2010  . Depression with anxiety 07/05/2010  . Right hip pain 07/05/2010    Social History   Tobacco Use  . Smoking status: Former Smoker    Packs/day: 0.30    Types: Cigarettes    Quit date: 02/12/2007    Years since quitting: 11.7  . Smokeless tobacco: Never Used  Substance Use Topics  . Alcohol use: Yes    Comment: occasional    Current Outpatient Medications:  .  albuterol (PROVENTIL HFA;VENTOLIN HFA) 108 (90 Base) MCG/ACT inhaler, Inhale 2 puffs into the lungs every 6 (six) hours as needed for wheezing or shortness of breath., Disp: 1 Inhaler, Rfl: 0 .  ciprofloxacin (CIPRO) 500 MG tablet, Take 1 tablet (500 mg total) by mouth 2 (two) times daily., Disp: 10 tablet, Rfl: 0 .  clonazePAM (KLONOPIN) 0.5 MG tablet, Take 1 tablet (0.5 mg total) by mouth 2 (two) times daily as needed for anxiety., Disp: 60 tablet, Rfl: 5 .  HYDROcodone-acetaminophen (NORCO) 10-325 MG tablet, Take 1 tablet by mouth 3 (three) times daily., Disp: , Rfl:  .  loperamide (IMODIUM) 2 MG capsule, Take 1 capsule (2 mg total) by mouth 4 (four) times daily as needed for diarrhea or loose stools., Disp: 12 capsule, Rfl: 0 .  mirtazapine (REMERON) 30 MG tablet, Take 1 tablet (30 mg total) by mouth at bedtime., Disp: 90 tablet,  Rfl: 1 .  nitrofurantoin, macrocrystal-monohydrate, (MACROBID) 100 MG capsule, 1 capsule PO qhs prn after intercourse, Disp: 30 capsule, Rfl: 5 .  rizatriptan (MAXALT) 10 MG tablet, May repeat in 2 hours if needed--max of 46m in 24h period., Disp: 10 tablet, Rfl: 2 .  valACYclovir (VALTREX) 1000 MG tablet, Take 1 tablet (1,000 mg total) by mouth daily., Disp: 30 tablet, Rfl: 0 .  venlafaxine XR (EFFEXOR-XR) 150 MG 24 hr capsule, Take 1 capsule (150 mg total) by mouth daily with breakfast. Needs appt prior to any additional refills., Disp: 90 capsule, Rfl: 1 .  vitamin B-12 (CYANOCOBALAMIN) 100 MCG tablet, Take 1 tablet (100 mcg total) by mouth daily., Disp: 60 tablet, Rfl: 0 .   zolpidem (AMBIEN CR) 6.25 MG CR tablet, Take 2 tablets (12.5 mg total) by mouth at bedtime as needed for sleep., Disp: 90 tablet, Rfl: 1  Allergies  Allergen Reactions  . Penicillins Hives and Rash    Has patient had a PCN reaction causing immediate rash, facial/tongue/throat swelling, SOB or lightheadedness with hypotension: Yes Has patient had a PCN reaction causing severe rash involving mucus membranes or skin necrosis: No Has patient had a PCN reaction that required hospitalization: No Has patient had a PCN reaction occurring within the last 10 years: No If all of the above answers are "NO", then may proceed with Cephalosporin use.;     OBJECTIVE: BP 95/65 (BP Location: Right Arm, Patient Position: Sitting, Cuff Size: Normal)   Pulse 87   Temp 97.6 F (36.4 C) (Temporal)   Resp 16   Ht _0  (1.651 m)   Wt 129 lb (58.5 kg)   SpO2 96%   BMI 21.47 kg/m  Gen: Afebrile. No acute distress.  HENT: AT. [place].  MMM.  Eyes:Pupils Equal Round Reactive to light, Extraocular movements intact,  Conjunctiva without redness, discharge or icterus. Neck/lymp/endocrine: Supple, right anterior cervical lymphadenopathy, no thyromegaly CV: RRR no murmur, no edema, +2/4 P posterior tibialis pulses Chest: CTAB, no wheeze or crackles Abd: Soft.  Flat. NTND. BS present.  No masses palpated.  No guarding or rebound  Skin: No rashes, purpura or petechiae.  Neuro:  Normal gait. PERLA. EOMi. Alert. Oriented x3  Psych: Normal affect, dress and demeanor. Normal speech. Normal thought content and judgment.   ASSESSMENT AND PLAN: Terri [name] a 33y.o. female present for  Need for influenza vaccination - Flu Vaccine QUAD 36+ mos IM  Lower abdominal pain/diarrhea/early satiety -Uncertain etiology of patient's discomfort.  Discussed differential diagnosis of gastritis versus pancreatitis versus reflux versus peptic ulcer disease as potential causes of her current symptoms.  Diarrhea has resolved.   However vague lower abdominal discomfort and early satiety with weight loss has occurred over the last 4 weeks weight loss has been significant have 19 pounds unintentionally. - Start omeprazole and Carafate. - Comp Met (CMET) - TSH - C-reactive protein - Sedimentation rate - CBC w/Diff - Lipase - H. pylori antibody, IgG - Lipase Follow-up 2 weeks.  Will refer to GI if labs are abnormal and referral was appropriate.   Orders Placed This Encounter  Procedures  . Flu Vaccine QUAD 36+ mos IM  . Comp Met (CMET)  . TSH  . C-reactive protein  . Sedimentation rate  . CBC w/Diff  . H. pylori antibody, IgG  . Lipase    > 25 minutes spent with patient, >50% of time spent face to face     RHoward Pouch DO 11/24/2018

## 2018-11-24 NOTE — Patient Instructions (Addendum)
Start Carafate before meals and omeprazole in the morning.    Gastritis, Adult  Gastritis is swelling (inflammation) of the stomach. Gastritis can develop quickly (acute). It can also develop slowly over time (chronic). It is important to get help for this condition. If you do not get help, your stomach can bleed, and you can get sores (ulcers) in your stomach. What are the causes? This condition may be caused by:  Germs that get to your stomach.  Drinking too much alcohol.  Medicines you are taking.  Too much acid in the stomach.  A disease of the intestines or stomach.  Stress.  An allergic reaction.  Crohn's disease.  Some cancer treatments (radiation). Sometimes the cause of this condition is not known. What are the signs or symptoms? Symptoms of this condition include:  Pain in your stomach.  A burning feeling in your stomach.  Feeling sick to your stomach (nauseous).  Throwing up (vomiting).  Feeling too full after you eat.  Weight loss.  Bad breath.  Throwing up blood.  Blood in your poop (stool). How is this diagnosed? This condition may be diagnosed with:  Your medical history and symptoms.  A physical exam.  Tests. These can include: ? Blood tests. ? Stool tests. ? A procedure to look inside your stomach (upper endoscopy). ? A test in which a sample of tissue is taken for testing (biopsy). How is this treated? Treatment for this condition depends on what caused it. You may be given:  Antibiotic medicine, if your condition was caused by germs.  H2 blockers and similar medicines, if your condition was caused by too much acid. Follow these instructions at home: Medicines  Take over-the-counter and prescription medicines only as told by your doctor.  If you were prescribed an antibiotic medicine, take it as told by your doctor. Do not stop taking it even if you start to feel better. Eating and drinking   Eat small meals often, instead of  large meals.  Avoid foods and drinks that make your symptoms worse.  Drink enough fluid to keep your pee (urine) pale yellow. Alcohol use  Do not drink alcohol if: ? Your doctor tells you not to drink. ? You are pregnant, may be pregnant, or are planning to become pregnant.  If you drink alcohol: ? Limit your use to:  0-1 drink a day for women.  0-2 drinks a day for men. ? Be aware of how much alcohol is in your drink. In the [place] one drink equals one 12 oz bottle of beer (355 mL), one 5 oz glass of wine (148 mL), or one 1 oz glass of hard liquor (44 mL). General instructions  Talk with your doctor about ways to manage stress. You can exercise or do deep breathing, meditation, or yoga.  Do not smoke or use products that have nicotine or tobacco. If you need help quitting, ask your doctor.  Keep all follow-up visits as told by your doctor. This is important. Contact a doctor if:  Your symptoms get worse.  Your symptoms go away and then come back. Get help right away if:  You throw up blood or something that looks like coffee grounds.  You have black or dark red poop.  You throw up any time you try to drink fluids.  Your stomach pain gets worse.  You have a fever.  You do not feel better after one week. Summary  Gastritis is swelling (inflammation) of the stomach.  You must get  help for this condition. If you do not get help, your stomach can bleed, and you can get sores (ulcers).  This condition is diagnosed with medical history, physical exam, or tests.  You can be treated with medicines for germs or medicines to block too much acid in your stomach. This information is not intended to replace advice given to you by your health care provider. Make sure you discuss any questions you have with your health care provider. Document Released: 07/17/2007 Document Revised: 06/17/2017 Document Reviewed: 06/17/2017 Elsevier Patient Education  2020 [employer].

## 2018-11-25 DIAGNOSIS — R6881 Early satiety: Secondary | ICD-10-CM | POA: Insufficient documentation

## 2018-11-25 DIAGNOSIS — R197 Diarrhea, unspecified: Secondary | ICD-10-CM | POA: Insufficient documentation

## 2018-11-25 DIAGNOSIS — R1030 Lower abdominal pain, unspecified: Secondary | ICD-10-CM | POA: Insufficient documentation

## 2018-11-26 DIAGNOSIS — G40909 Epilepsy, unspecified, not intractable, without status epilepticus: Secondary | ICD-10-CM | POA: Insufficient documentation

## 2018-11-30 ENCOUNTER — Telehealth: Payer: Self-pay | Admitting: Family Medicine

## 2018-11-30 DIAGNOSIS — S73191S Other sprain of right hip, sequela: Secondary | ICD-10-CM

## 2018-11-30 DIAGNOSIS — G8929 Other chronic pain: Secondary | ICD-10-CM

## 2018-11-30 DIAGNOSIS — M25551 Pain in right hip: Secondary | ICD-10-CM | POA: Insufficient documentation

## 2018-11-30 DIAGNOSIS — G894 Chronic pain syndrome: Secondary | ICD-10-CM | POA: Insufficient documentation

## 2018-11-30 DIAGNOSIS — M7061 Trochanteric bursitis, right hip: Secondary | ICD-10-CM

## 2018-11-30 DIAGNOSIS — S73191A Other sprain of right hip, initial encounter: Secondary | ICD-10-CM | POA: Insufficient documentation

## 2018-11-30 HISTORY — DX: Trochanteric bursitis, right hip: M70.61

## 2018-11-30 NOTE — Telephone Encounter (Signed)
Reviewed pts chronic pain encounters.  Agreed to take over her pain management.  She may schedule appointment to start pain management here.

## 2018-12-03 ENCOUNTER — Other Ambulatory Visit: Payer: Self-pay

## 2018-12-03 ENCOUNTER — Encounter: Payer: Self-pay | Admitting: Family Medicine

## 2018-12-03 ENCOUNTER — Ambulatory Visit: Payer: 59 | Admitting: Family Medicine

## 2018-12-03 VITALS — BP 78/55 | HR 93 | Temp 98.2°F | Resp 16 | Ht 65.0 in | Wt 126.0 lb

## 2018-12-03 DIAGNOSIS — M7061 Trochanteric bursitis, right hip: Secondary | ICD-10-CM | POA: Diagnosis not present

## 2018-12-03 DIAGNOSIS — G8929 Other chronic pain: Secondary | ICD-10-CM

## 2018-12-03 DIAGNOSIS — G894 Chronic pain syndrome: Secondary | ICD-10-CM

## 2018-12-03 DIAGNOSIS — M25551 Pain in right hip: Secondary | ICD-10-CM | POA: Diagnosis not present

## 2018-12-03 DIAGNOSIS — S73191S Other sprain of right hip, sequela: Secondary | ICD-10-CM

## 2018-12-03 DIAGNOSIS — M791 Myalgia, unspecified site: Secondary | ICD-10-CM

## 2018-12-03 MED ORDER — HYDROCODONE-ACETAMINOPHEN 10-325 MG PO TABS: 1.0000 | ORAL_TABLET | Freq: Two times a day (BID) | ORAL | 0 refills | Status: DC

## 2018-12-03 NOTE — Progress Notes (Signed)
Terri [name] , 1986/01/23, 33 y.o., female MRN: [id number] Patient Care Team    Relationship Specialty Notifications Start End  Natalia Leatherwood, DO PCP - General Family Medicine  05/16/15   Ardell Isaacs, MD Consulting Physician Pain Medicine  03/17/13   Drema Dallas, DO Consulting Physician Neurology  03/24/15     Chief Complaint  Patient presents with  . Pain management    Pt is here today for this office to start taking over her pain management. Per pt, has not had medication sunce June. Pt is fasting. Rechecked BP manually.      Subjective: Pt presents for an OV to take over chronic pain management.  Patient has been following with pain management for her chronic musculoskeletal complaints concerning her hip pain/chronic pain syndrome.  She has chronic right hip pain secondary to right hip tendinitis and chronic pain secondary to right acetabular labrum tear.  She has undergone surgical procedures for her hip as well.  Unfortunately it has not increased her quality of life or control her pain.  She reports chronic pain with daily use such as walking, climbing stairs etc.  She states that she would try hiking or any physical sport she would have severe pain.  She states the pain is worse with external rotation and flexion of her hip.  She has been prescribed Norco 10-325 3 times daily as needed through her pain specialist.  She reports she routinely takes 1 to 2 tablets daily for her pain. Indication for chronic opioid: Chronic right hip pain labrum tear Medication and dose: Norco 10-3 25 twice daily # pills per month: 60 Last UDS date: Collected today Pain contract signed (Y/N): Signed today Date narcotic database last reviewed (include red flags): 12/03/2018   Depression screen [facility] 2/9 03/13/2018 08/29/2017 01/29/2017 10/25/2016 09/18/2016  Decreased Interest 3 1 2  0 1  Down, Depressed, Hopeless 3 3 2  0 3  PHQ - 2 Score 6 4 4  0 4  Altered sleeping 3 3 3  0 1  Tired, decreased energy 3  3 2 1 1   Change in appetite 0 1 1 2 2   Feeling bad or failure about yourself  3 2 2  0 1  Trouble concentrating 2 1 1  0 1  Moving slowly or fidgety/restless 1 0 1 0 0  Suicidal thoughts 0 0 0 0 0  PHQ-9 Score 18 14 14 3 10   Difficult doing work/chores Somewhat difficult Somewhat difficult - Not difficult at all -    Allergies  Allergen Reactions  . Penicillins Hives and Rash    Has patient had a PCN reaction causing immediate rash, facial/tongue/throat swelling, SOB or lightheadedness with hypotension: Yes Has patient had a PCN reaction causing severe rash involving mucus membranes or skin necrosis: No Has patient had a PCN reaction that required hospitalization: No Has patient had a PCN reaction occurring within the last 10 years: No If all of the above answers are "NO", then may proceed with Cephalosporin use.;    Social History   Social History Narrative   Single.     Lives in [place] with her mom and dad and her 68 y/o son .   Works as a with mental/developmental disabilities.   Describes somewhat wild teenage years, "made some bad choices and hung out with the wrong people".  However, after that she had no issues with drugs or alcohol.   Reports that she had a second child a couple of  years after her son [name] (2008) and she gave him/her up for adoption right after birth.                 Past Medical History:  Diagnosis Date  . Allergy   . Brain injury [facility]) 33 yrs old   fell off babysitters cabinet, and on a separate occasion she fell down a flight of stairs  . Chronic pain disorder   . Chronic right hip pain   . Closed head injury About age 89 yrs   ICH per pt, with seizure disorder for appox 2 yrs afterward.  No significant residual disability.  . Depression with anxiety    Failed buspar.  Did well on citalopram.  . Drug overdose 2004   Xanax  . H/O varicella   . History of chlamydia   . HSV infection    Genital clx pos HSV  2--2007.  Marland Kitchen Hx of migraines    chronic migraines (Dr. Everlena Cooper) 03/2015  . Hx: UTI (urinary tract infection) 09/06/10  . IBS (irritable bowel syndrome)   . Peri-trochanteric right hip tendinitis   . Right hip pain 05/2009   MRI 10/12/09: Femoral head and neck stress injury plus small acetabular labral tear.  [facility] ortho (Dr. Caswell Corwin) as of 11/2012.  Preferred pain mgmt: steroid injection into right subtrochanteric bursa 12/06/15.  . Seizure (HCC)    for 2 yrs after closed head injury/ICH  . Tear of right acetabular labrum   . Trochanteric bursitis of right hip    Past Surgical History:  Procedure Laterality Date  . HIP SURGERY  10/10/2010   Right hip GT bursectomy and osteophytectomy for intractable right greater troch pain (Dr. Despina Hick).  [facility] second opinion 06/2012--resulted in repeat MRI, then right hip IA injection that gave mild short term response, then was referred to PT with iontophoresis (as of 09/07/12)--was told to f/u in 6 wks.  Marland Kitchen MOLE REMOVAL     on back  . WISDOM TOOTH EXTRACTION     Family History  Problem Relation Age of Onset  . Breast cancer Mother 70  . Thyroid cancer Mother   . Hepatitis Father        Hep C  . Alcohol abuse Father   . Thyroid disease Sister   . Anxiety disorder Sister   . Thyroid cancer Sister   . Thrombophlebitis Maternal Grandfather   . Anxiety disorder Maternal Grandfather   . Heart disease Maternal Grandfather   . Alzheimer's disease Paternal Grandmother    Allergies as of 12/03/2018      Reactions   Penicillins Hives, Rash   Has patient had a PCN reaction causing immediate rash, facial/tongue/throat swelling, SOB or lightheadedness with hypotension: Yes Has patient had a PCN reaction causing severe rash involving mucus membranes or skin necrosis: No Has patient had a PCN reaction that required hospitalization: No Has patient had a PCN reaction occurring within the last 10 years: No If all of the above answers are "NO", then may proceed with  Cephalosporin use.;      Medication List       Accurate as of December 03, 2018 11:13 AM. If you have any questions, ask your nurse or doctor.        albuterol 108 (90 Base) MCG/ACT inhaler Commonly known as: VENTOLIN HFA Inhale 2 puffs into the lungs every 6 (six) hours as needed for wheezing or shortness of breath.   clonazePAM 0.5 MG tablet Commonly known as: KLONOPIN Take 1 tablet (0.5  mg total) by mouth 2 (two) times daily as needed for anxiety.   HYDROcodone-acetaminophen 10-325 MG tablet Commonly known as: NORCO Take 1 tablet by mouth 3 (three) times daily.   mirtazapine 30 MG tablet Commonly known as: Remeron Take 1 tablet (30 mg total) by mouth at bedtime.   nitrofurantoin (macrocrystal-monohydrate) 100 MG capsule Commonly known as: MACROBID 1 capsule PO qhs prn after intercourse   omeprazole 20 MG capsule Commonly known as: PRILOSEC Take 1 capsule (20 mg total) by mouth daily.   rizatriptan 10 MG tablet Commonly known as: MAXALT May repeat in 2 hours if needed--max of 30mg  in 24h period.   sucralfate 1 g tablet Commonly known as: Carafate Take 1 tablet (1 g total) by mouth 4 (four) times daily -  with meals and at bedtime.   valACYclovir 1000 MG tablet Commonly known as: VALTREX Take 1 tablet (1,000 mg total) by mouth daily.   venlafaxine XR 150 MG 24 hr capsule Commonly known as: EFFEXOR-XR Take 1 capsule (150 mg total) by mouth daily with breakfast. Needs appt prior to any additional refills.   vitamin B-12 100 MCG tablet Commonly known as: CYANOCOBALAMIN Take 1 tablet (100 mcg total) by mouth daily.   zolpidem 6.25 MG CR tablet Commonly known as: AMBIEN CR Take 2 tablets (12.5 mg total) by mouth at bedtime as needed for sleep.       All past medical history, surgical history, allergies, family history, immunizations andmedications were updated in the EMR today and reviewed under the history and medication portions of their EMR.     ROS:  Negative, with the exception of above mentioned in HPI   Objective:  BP (!) 78/55 (BP Location: Left Arm, Patient Position: Sitting, Cuff Size: Normal)   Pulse 93   Temp 98.2 F (36.8 C) (Temporal)   Resp 16   Ht 5\' 5"  (1.651 m)   Wt 126 lb (57.2 kg)   SpO2 99%   BMI 20.97 kg/m  Body mass index is 20.97 kg/m. Gen: Afebrile. No acute distress. Nontoxic in appearance, well developed, well nourished.  HENT: AT. [place]. Eyes:Pupils Equal Round Reactive to light, Extraocular movements intact,  Conjunctiva without redness, discharge or icterus Neuro: Normal gait. PERLA. EOMi. Alert. Oriented x3 Psych: Normal affect, dress and demeanor. Normal speech. Normal thought content and judgment.  No exam data present No results found. No results found for this or any previous visit (from the past 24 hour(s)).  Assessment/Plan: Terri [name] is a 33 y.o. female present for OV for  Chronic right hip pain/Encounter for chronic pain management/Peri-trochanteric right hip tendinitis/Trochanteric bursitis, right hip/Trigger point of right side of body/Tear of right acetabular labrum, sequela/Chronic pain syndrome Agreed to take over management of her chronic hip pain.  She has a Careers advisersurgeon in which she follows with for her conditions.  She unfortunately has not had a decrease in her pain despite attempts at surgical treatment. -[place] controlled substance database reviewed 12/03/2018.  And is appropriate. - Norco 10-325 mg twice daily prescribed #60, with 2 additional refills called in. - Pain Mgmt, Profile 8 w/Conf, U -Patient is aware she will need to follow-up every 3 months face to face with this provider only to continue chronic pain management.   Reviewed expectations re: course of current medical issues.  Discussed self-management of symptoms.  Outlined signs and symptoms indicating need for more acute intervention.  Patient verbalized understanding and all questions were answered.   Patient received an After-Visit Summary.  Orders Placed This Encounter  Procedures  . Pain Mgmt, Profile 8 w/Conf, U   > 25 minutes spent with patient, >50% of time spent face to face     Note is dictated utilizing voice recognition software. Although note has been proof read prior to signing, occasional typographical errors still can be missed. If any questions arise, please do not hesitate to call for verification.   electronically signed by:  Howard Pouch, DO  [place]

## 2018-12-03 NOTE — Patient Instructions (Signed)
I have sent in 3 month supply of medications. You will need to call pharmacy for refills each time and not use # of script on bottle for automatic refill. They will have dates on them monthly on when can be picked up.    Follow up in person every 3 months.

## 2018-12-07 ENCOUNTER — Other Ambulatory Visit: Payer: Self-pay

## 2018-12-07 ENCOUNTER — Encounter: Payer: Self-pay | Admitting: Family Medicine

## 2018-12-07 MED ORDER — VALACYCLOVIR HCL 1 G PO TABS: 1000.0000 mg | ORAL_TABLET | Freq: Every day | ORAL | 3 refills | Status: DC

## 2018-12-07 NOTE — Telephone Encounter (Signed)
Received faxed refill request for Valtrex from [facility].   LOV:12/03/2018 Next ov: Not scheduled  Last written:11/12/2018 #30 no refills    Please advise

## 2018-12-08 ENCOUNTER — Telehealth: Payer: 59 | Admitting: Nurse Practitioner

## 2018-12-08 ENCOUNTER — Other Ambulatory Visit: Payer: Self-pay

## 2018-12-08 DIAGNOSIS — Z20822 Contact with and (suspected) exposure to covid-19: Secondary | ICD-10-CM

## 2018-12-08 DIAGNOSIS — J069 Acute upper respiratory infection, unspecified: Secondary | ICD-10-CM

## 2018-12-08 MED ORDER — FLUTICASONE PROPIONATE 50 MCG/ACT NA SUSP: 2.0000 | Freq: Every day | NASAL | 6 refills | Status: DC

## 2018-12-08 NOTE — Progress Notes (Signed)
We are sorry you are not feeling well.  Here is how we plan to help!  Based on what you have shared with me, it looks like you may have a viral upper respiratory infection.  Upper respiratory infections are caused by a large number of viruses; however, rhinovirus is the most common cause.   Symptoms vary from person to person, with common symptoms including sore throat, cough, fatigue or lack of energy and feeling of general discomfort.  A low-grade fever of up to 100.4 may present, but is often uncommon.  Symptoms vary however, and are closely related to a person's age or underlying illnesses.  The most common symptoms associated with an upper respiratory infection are nasal discharge or congestion, cough, sneezing, headache and pressure in the ears and face.  These symptoms usually persist for about 3 to 10 days, but can last up to 2 weeks.  It is important to know that upper respiratory infections do not cause serious illness or complications in most cases.    Upper respiratory infections can be transmitted from person to person, with the most common method of transmission being a person's hands.  The virus is able to live on the skin and can infect other persons for up to 2 hours after direct contact.  Also, these can be transmitted when someone coughs or sneezes; thus, it is important to cover the mouth to reduce this risk.  To keep the spread of the illness at bay, good hand hygiene is very important.  This is an infection that is most likely caused by a virus. There are no specific treatments other than to help you with the symptoms until the infection runs its course.  We are sorry you are not feeling well.  Here is how we plan to help!   For nasal congestion, you may use an oral decongestants such as Mucinex D or if you have glaucoma or high blood pressure use plain Mucinex.  Saline nasal spray or nasal drops can help and can safely be used as often as needed for congestion.  For your congestion,  I have prescribed Fluticasone nasal spray one spray in each nostril twice a day  If you do not have a history of heart disease, hypertension, diabetes or thyroid disease, prostate/bladder issues or glaucoma, you may also use Sudafed to treat nasal congestion.  It is highly recommended that you consult with a pharmacist or your primary care physician to ensure this medication is safe for you to take.     If you have a cough, you may use cough suppressants such as Delsym and Robitussin.  If you have glaucoma or high blood pressure, you can also use Coricidin HBP.    If you have a sore or scratchy throat, use a saltwater gargle-  to  teaspoon of salt dissolved in a 4-ounce to 8-ounce glass of warm water.  Gargle the solution for approximately 15-30 seconds and then spit.  It is important not to swallow the solution.  You can also use throat lozenges/cough drops and Chloraseptic spray to help with throat pain or discomfort.  Warm or cold liquids can also be helpful in relieving throat pain.  For headache, pain or general discomfort, you can use Ibuprofen or Tylenol as directed.   Some authorities believe that zinc sprays or the use of Echinacea may shorten the course of your symptoms.   HOME CARE . Only take medications as instructed by your medical team. . Be sure to drink plenty   of fluids. Water is fine as well as fruit juices, sodas and electrolyte beverages. You may want to stay away from caffeine or alcohol. If you are nauseated, try taking small sips of liquids. How do you know if you are getting enough fluid? Your urine should be a pale yellow or almost colorless. . Get rest. . Taking a steamy shower or using a humidifier may help nasal congestion and ease sore throat pain. You can place a towel over your head and breathe in the steam from hot water coming from a faucet. . Using a saline nasal spray works much the same way. . Cough drops, hard candies and sore throat lozenges may ease your  cough. . Avoid close contacts especially the very young and the elderly . Cover your mouth if you cough or sneeze . Always remember to wash your hands.   GET HELP RIGHT AWAY IF: . You develop worsening fever. . If your symptoms do not improve within 10 days . You develop yellow or green discharge from your nose over 3 days. . You have coughing fits . You develop a severe head ache or visual changes. . You develop shortness of breath, difficulty breathing or start having chest pain . Your symptoms persist after you have completed your treatment plan  MAKE SURE YOU   Understand these instructions.  Will watch your condition.  Will get help right away if you are not doing well or get worse.  Your e-visit answers were reviewed by a board certified advanced clinical practitioner to complete your personal care plan. Depending upon the condition, your plan could have included both over the counter or prescription medications. Please review your pharmacy choice. If there is a problem, you may call our nursing hot line at and have the prescription routed to another pharmacy. Your safety is important to us. If you have drug allergies check your prescription carefully.   You can use MyChart to ask questions about today's visit, request a non-urgent call back, or ask for a work or school excuse for 24 hours related to this e-Visit. If it has been greater than 24 hours you will need to follow up with your provider, or enter a new e-Visit to address those concerns. You will get an e-mail in the next two days asking about your experience.  I hope that your e-visit has been valuable and will speed your recovery. Thank you for using e-visits.   5-10 minutes spent reviewing and documenting in chart.    

## 2018-12-09 LAB — PAIN MGMT, PROFILE 8 W/CONF, U
6 Acetylmorphine: NEGATIVE ng/mL
Alcohol Metabolites: NEGATIVE ng/mL (ref <500)
Alphahydroxyalprazolam: NEGATIVE ng/mL
Alphahydroxymidazolam: NEGATIVE ng/mL
Alphahydroxytriazolam: NEGATIVE ng/mL
Aminoclonazepam: 103 ng/mL
Amphetamines: NEGATIVE ng/mL
Benzodiazepines: POSITIVE ng/mL
Buprenorphine, Urine: NEGATIVE ng/mL
Buprenorphine: NEGATIVE ng/mL
Cocaine Metabolite: NEGATIVE ng/mL
Codeine: NEGATIVE ng/mL
Creatinine: 140.8 mg/dL
Hydrocodone: NEGATIVE ng/mL
Hydromorphone: NEGATIVE ng/mL
Hydroxyethylflurazepam: NEGATIVE ng/mL
Lorazepam: NEGATIVE ng/mL
MDMA: NEGATIVE ng/mL
Marijuana Metabolite: NEGATIVE ng/mL
Morphine: NEGATIVE ng/mL
Nordiazepam: NEGATIVE ng/mL
Norhydrocodone: NEGATIVE ng/mL
Opiates: NEGATIVE ng/mL
Oxazepam: NEGATIVE ng/mL
Oxidant: NEGATIVE ug/mL
Oxycodone: NEGATIVE ng/mL
Temazepam: NEGATIVE ng/mL
pH: 5.6 (ref 4.5–9.0)

## 2018-12-10 LAB — NOVEL CORONAVIRUS, NAA: SARS-CoV-2, NAA: NOT DETECTED

## 2019-01-12 ENCOUNTER — Other Ambulatory Visit: Payer: Self-pay | Admitting: Family Medicine

## 2019-01-12 DIAGNOSIS — G479 Sleep disorder, unspecified: Secondary | ICD-10-CM

## 2019-01-13 NOTE — Telephone Encounter (Signed)
Please call patient. I have received refill request for pts ambien and pt is due for 6 mos follow up. Please schedule ASAP.

## 2019-02-22 ENCOUNTER — Ambulatory Visit: Payer: 59 | Admitting: Family Medicine

## 2019-02-22 ENCOUNTER — Encounter: Payer: Self-pay | Admitting: Family Medicine

## 2019-02-22 ENCOUNTER — Other Ambulatory Visit: Payer: Self-pay

## 2019-02-22 VITALS — BP 93/62 | HR 71 | Temp 98.4°F | Resp 18 | Wt 123.4 lb

## 2019-02-22 DIAGNOSIS — F418 Other specified anxiety disorders: Secondary | ICD-10-CM

## 2019-02-22 DIAGNOSIS — F411 Generalized anxiety disorder: Secondary | ICD-10-CM

## 2019-02-22 DIAGNOSIS — F410 Panic disorder [episodic paroxysmal anxiety] without agoraphobia: Secondary | ICD-10-CM

## 2019-02-22 DIAGNOSIS — G479 Sleep disorder, unspecified: Secondary | ICD-10-CM

## 2019-02-22 DIAGNOSIS — G894 Chronic pain syndrome: Secondary | ICD-10-CM

## 2019-02-22 DIAGNOSIS — G8929 Other chronic pain: Secondary | ICD-10-CM

## 2019-02-22 DIAGNOSIS — M7061 Trochanteric bursitis, right hip: Secondary | ICD-10-CM

## 2019-02-22 DIAGNOSIS — Z3042 Encounter for surveillance of injectable contraceptive: Secondary | ICD-10-CM | POA: Diagnosis not present

## 2019-02-22 DIAGNOSIS — M25551 Pain in right hip: Secondary | ICD-10-CM

## 2019-02-22 DIAGNOSIS — S73191S Other sprain of right hip, sequela: Secondary | ICD-10-CM

## 2019-02-22 LAB — POCT URINE PREGNANCY: Preg Test, Ur: NEGATIVE

## 2019-02-22 MED ORDER — HYDROCODONE-ACETAMINOPHEN 10-325 MG PO TABS: 1.0000 | ORAL_TABLET | Freq: Two times a day (BID) | ORAL | 0 refills | Status: DC

## 2019-02-22 MED ORDER — ZOLPIDEM TARTRATE ER 6.25 MG PO TBCR: 6.2500 mg | EXTENDED_RELEASE_TABLET | Freq: Every evening | ORAL | 1 refills | Status: DC | PRN

## 2019-02-22 MED ORDER — CLONAZEPAM 0.5 MG PO TABS: 0.5000 mg | ORAL_TABLET | Freq: Two times a day (BID) | ORAL | 5 refills | Status: DC | PRN

## 2019-02-22 MED ORDER — MEDROXYPROGESTERONE ACETATE 150 MG/ML IM SUSP
150.0000 mg | Freq: Once | INTRAMUSCULAR | Status: AC
  Administered 2019-02-22: 150 mg via INTRAMUSCULAR

## 2019-02-22 NOTE — Patient Instructions (Signed)
I have refilled the controlled medications today.  Start depo provera today, assuming preg test is negative. Repeat every 12 weeks.   Please help [place] help you:  We are honored you have chosen [facility] for your Primary Care home. Below you will find basic instructions that you may need to access in the future. Please help [place] help you by reading the instructions, which cover many of the frequent questions we experience.   Prescription refills and request:  -In order to allow more efficient response time, please call your pharmacy for all refills. They will forward the request electronically to [place]. This allows for the quickest possible response. Request left on a nurse line can take longer to refill, since these are checked as time allows between office patients and other phone calls.  - refill request can take up to 3-5 working days to complete.  - If request is sent electronically and request is appropiate, it is usually completed in 1-2 business days.  - all patients will need to be seen routinely for all chronic medical conditions requiring prescription medications (see follow-up below). If you are overdue for follow up on your condition, you will be asked to make an appointment and we will call in enough medication to cover you until your appointment (up to 30 days).  - all controlled substances will require a face to face visit to request/refill.  - if you desire your prescriptions to go through a new pharmacy, and have an active script at original pharmacy, you will need to call your pharmacy and have scripts transferred to new pharmacy. This is completed between the pharmacy locations and not by your provider.    Results: If any images or labs were ordered, it can take up to 1 week to get results depending on the test ordered and the lab/facility running and resulting the test. - Normal or stable results, which do not need further discussion, may be released to your mychart immediately  with attached note to you. A call may not be generated for normal results. Please make certain to sign up for mychart. If you have questions on how to activate your mychart you can call the front office.  - If your results need further discussion, our office will attempt to contact you via phone, and if unable to reach you after 2 attempts, we will release your abnormal result to your mychart with instructions.  - All results will be automatically released in mychart after 1 week.  - Your provider will provide you with explanation and instruction on all relevant material in your results. Please keep in mind, results and labs may appear confusing or abnormal to the untrained eye, but it does not mean they are actually abnormal for you personally. If you have any questions about your results that are not covered, or you desire more detailed explanation than what was provided, you should make an appointment with your provider to do so.   Our office handles many outgoing and incoming calls daily. If we have not contacted you within 1 week about your results, please check your mychart to see if there is a message first and if not, then contact our office.  In helping with this matter, you help decrease call volume, and therefore allow [place] to be able to respond to patients needs more efficiently.   Acute office visits (sick visit):  An acute visit is intended for a new problem and are scheduled in shorter time slots to allow  schedule openings for patients with new problems. This is the appropriate visit to discuss a new problem. Problems will not be addressed by phone call or Echart message. Appointment is needed if requesting treatment. In order to provide you with excellent quality medical care with proper time for you to explain your problem, have an exam and receive treatment with instructions, these appointments should be limited to one new problem per visit. If you experience a new problem, in which you  desire to be addressed, please make an acute office visit, we save openings on the schedule to accommodate you. Please do not save your new problem for any other type of visit, let us take care of it properly and quickly for you.   Follow up visits:  Depending on your condition(s) your provider will need to see you routinely in order to provide you with quality care and prescribe medication(s). Most chronic conditions (Example: hypertension, Diabetes, depression/anxiety... etc), require visits a couple times a year. Your provider will instruct you on proper follow up for your personal medical conditions and history. Please make certain to make follow up appointments for your condition as instructed. Failing to do so could result in lapse in your medication treatment/refills. If you request a refill, and are overdue to be seen on a condition, we will always provide you with a 30 day script (once) to allow you time to schedule.    Medicare wellness (well visit): - we have a wonderful Nurse Selena Batten), that will meet with you and provide you will yearly medicare wellness visits. These visits should occur yearly (can not be scheduled less than 1 calendar year apart) and cover preventive health, immunizations, advance directives and screenings you are entitled to yearly through your medicare benefits. Do not miss out on your entitled benefits, this is when medicare will pay for these benefits to be ordered for you.  These are strongly encouraged by your provider and is the appropriate type of visit to make certain you are up to date with all preventive health benefits. If you have not had your medicare wellness exam in the last 12 months, please make certain to schedule one by calling the office and schedule your medicare wellness with Selena Batten as soon as possible.   Yearly physical (well visit):  - Adults are recommended to be seen yearly for physicals. Check with your insurance and date of your last physical, most  insurances require one calendar year between physicals. Physicals include all preventive health topics, screenings, medical exam and labs that are appropriate for gender/age and history. You may have fasting labs needed at this visit. This is a well visit (not a sick visit), new problems should not be covered during this visit (see acute visit).  - Pediatric patients are seen more frequently when they are younger. Your provider will advise you on well child visit timing that is appropriate for your their age. - This is not a medicare wellness visit. Medicare wellness exams do not have an exam portion to the visit. Some medicare companies allow for a physical, some do not allow a yearly physical. If your medicare allows a yearly physical you can schedule the medicare wellness with our nurse Selena Batten and have your physical with your provider after, on the same day. Please check with insurance for your full benefits.   Late Policy/No Shows:  - all new patients should arrive 15-30 minutes earlier than appointment to allow [place] time  to  obtain all personal demographics,  insurance  information and for you to complete office paperwork. - All established patients should arrive 10-15 minutes earlier than appointment time to update all information and be checked in .  - In our best efforts to run on time, if you are late for your appointment you will be asked to either reschedule or if able, we will work you back into the schedule. There will be a wait time to work you back in the schedule,  depending on availability.  - If you are unable to make it to your appointment as scheduled, please call 24 hours ahead of time to allow [place] to fill the time slot with someone else who needs to be seen. If you do not cancel your appointment ahead of time, you may be charged a no show fee.

## 2019-02-22 NOTE — Progress Notes (Signed)
Terri [name] , March 11, 1985, 34 y.o., female MRN: [id number] Patient Care Team    Relationship Specialty Notifications Start End  Natalia Leatherwood, DO PCP - General Family Medicine  05/16/15   Ardell Isaacs, MD Consulting Physician Pain Medicine  03/17/13   Drema Dallas, DO Consulting Physician Neurology  03/24/15     Chief Complaint  Patient presents with  . Pain management  . Contraception    wants to start Depo-provera. Removed her Mirena in Nov.      Subjective: Terri [name] is a 34 y.o. female present for chronic pain management.    - she reports she is doing well now that she is back on her painmedication routinely. She has chronic right hip pain secondary to right hip tendinitis and chronic pain secondary to right acetabular labrum tear.  She has undergone surgical procedures for her hip as well.  Unfortunately it has not increased her quality of life or control her pain.  She reports chronic pain with daily use such as walking, climbing stairs etc.  She states that she would try hiking or any physical sport she would have severe pain.  She states the pain is worse with external rotation and flexion of her hip.  She has been prescribed Norco 10-325 3 times daily as needed through her pain specialist.  She reports she routinely takes 1 to 2 tablets daily for her pain. Indication for chronic opioid: Chronic right hip pain labrum tear Medication and dose: Norco 10-3 25 twice daily # pills per month: 60 Last UDS date: Collected 11/2018 Pain contract signed (Y/N): Signed 11/2018 Date narcotic database last reviewed (include red flags): 02/22/19   Contraceptive management: lmp 02/05/2019 after IUD removal in [place]. She had been on depo provera in the past and would like to restart.    Depression screen [facility] 2/9 03/13/2018 08/29/2017 01/29/2017 10/25/2016 09/18/2016  Decreased Interest 3 1 2  0 1  Down, Depressed, Hopeless 3 3 2  0 3  PHQ - 2 Score 6 4 4  0 4  Altered sleeping 3 3 3  0  1  Tired, decreased energy 3 3 2 1 1   Change in appetite 0 1 1 2 2   Feeling bad or failure about yourself  3 2 2  0 1  Trouble concentrating 2 1 1  0 1  Moving slowly or fidgety/restless 1 0 1 0 0  Suicidal thoughts 0 0 0 0 0  PHQ-9 Score 18 14 14 3 10   Difficult doing work/chores Somewhat difficult Somewhat difficult - Not difficult at all -    Allergies  Allergen Reactions  . Penicillins Hives and Rash    Has patient had a PCN reaction causing immediate rash, facial/tongue/throat swelling, SOB or lightheadedness with hypotension: Yes Has patient had a PCN reaction causing severe rash involving mucus membranes or skin necrosis: No Has patient had a PCN reaction that required hospitalization: No Has patient had a PCN reaction occurring within the last 10 years: No If all of the above answers are "NO", then may proceed with Cephalosporin use.;    Social History   Social History Narrative   Single.     Lives in [place] with her mom and dad and her 72 y/o son .   Works as a with mental/developmental disabilities.   Describes somewhat wild teenage years, "made some bad choices and hung out with the wrong people".  However, after that she had no issues with drugs or alcohol.  Reports that she had a second child a couple of years after her son Jeannetta Ellis (2008) and she gave him/her up for adoption right after birth.                 Past Medical History:  Diagnosis Date  . Allergic rhinitis 12/01/2014  . Allergy   . Brain injury [facility]) 34 yrs old   fell off babysitters cabinet, and on a separate occasion she fell down a flight of stairs  . Chronic pain disorder   . Chronic right hip pain   . Closed head injury About age 65 yrs   ICH per pt, with seizure disorder for appox 2 yrs afterward.  No significant residual disability.  . Depression with anxiety    Failed buspar.  Did well on citalopram.  . Drug overdose 2004   Xanax  . H/O varicella   .  History of chlamydia   . HSV infection    Genital clx pos HSV 2--2007.  Marland Kitchen Hx of migraines    chronic migraines (Dr. Tomi Likens) 03/2015  . Hx: UTI (urinary tract infection) 09/06/10  . IBS (irritable bowel syndrome)   . Peri-trochanteric right hip tendinitis   . Right hip pain 05/2009   MRI 10/12/09: Femoral head and neck stress injury plus small acetabular labral tear.  [facility] ortho (Dr. Aretha Parrot) as of 11/2012.  Preferred pain mgmt: steroid injection into right subtrochanteric bursa 12/06/15.  . Seizure ([place])    for 2 yrs after closed head injury/ICH  . Tear of right acetabular labrum   . Trochanteric bursitis of right hip    Past Surgical History:  Procedure Laterality Date  . HIP SURGERY  10/10/2010   Right hip GT bursectomy and osteophytectomy for intractable right greater troch pain (Dr. Maureen Ralphs).  [facility] second opinion 06/2012--resulted in repeat MRI, then right hip IA injection that gave mild short term response, then was referred to PT with iontophoresis (as of 09/07/12)--was told to f/u in 6 wks.  Marland Kitchen MOLE REMOVAL     on back  . WISDOM TOOTH EXTRACTION     Family History  Problem Relation Age of Onset  . Breast cancer Mother 25  . Thyroid cancer Mother   . Hepatitis Father        Hep C  . Alcohol abuse Father   . Thyroid disease Sister   . Anxiety disorder Sister   . Thyroid cancer Sister   . Thrombophlebitis Maternal Grandfather   . Anxiety disorder Maternal Grandfather   . Heart disease Maternal Grandfather   . Alzheimer's disease Paternal Grandmother    Allergies as of 02/22/2019      Reactions   Penicillins Hives, Rash   Has patient had a PCN reaction causing immediate rash, facial/tongue/throat swelling, SOB or lightheadedness with hypotension: Yes Has patient had a PCN reaction causing severe rash involving mucus membranes or skin necrosis: No Has patient had a PCN reaction that required hospitalization: No Has patient had a PCN reaction occurring within the last 10 years:  No If all of the above answers are "NO", then may proceed with Cephalosporin use.;      Medication List       Accurate as of February 22, 2019  5:18 PM. If you have any questions, ask your nurse or doctor.        STOP taking these medications   sucralfate 1 g tablet Commonly known as: Carafate Stopped by: Howard Pouch, DO     TAKE these medications  albuterol 108 (90 Base) MCG/ACT inhaler Commonly known as: VENTOLIN HFA Inhale 2 puffs into the lungs every 6 (six) hours as needed for wheezing or shortness of breath.   clonazePAM 0.5 MG tablet Commonly known as: KLONOPIN Take 1 tablet (0.5 mg total) by mouth 2 (two) times daily as needed for anxiety.   fluticasone 50 MCG/ACT nasal spray Commonly known as: FLONASE Place 2 sprays into both nostrils daily.   HYDROcodone-acetaminophen 10-325 MG tablet Commonly known as: NORCO Take 1 tablet by mouth 2 (two) times daily.   omeprazole 20 MG capsule Commonly known as: PRILOSEC Take 1 capsule (20 mg total) by mouth daily.   rizatriptan 10 MG tablet Commonly known as: MAXALT May repeat in 2 hours if needed--max of 30mg  in 24h period.   valACYclovir 1000 MG tablet Commonly known as: VALTREX Take 1 tablet (1,000 mg total) by mouth daily.   venlafaxine XR 150 MG 24 hr capsule Commonly known as: EFFEXOR-XR Take 1 capsule (150 mg total) by mouth daily with breakfast. Needs appt prior to any additional refills.   vitamin B-12 100 MCG tablet Commonly known as: CYANOCOBALAMIN Take 1 tablet (100 mcg total) by mouth daily.   zolpidem 6.25 MG CR tablet Commonly known as: AMBIEN CR Take 1 tablet (6.25 mg total) by mouth at bedtime as needed for sleep. What changed: how much to take Changed by: , DO       All past medical history, surgical history, allergies, family history, immunizations andmedications were updated in the EMR today and reviewed under the history and medication portions of their EMR.     ROS:  Negative, with the exception of above mentioned in HPI   Objective:  BP 93/62 (BP Location: Left Arm, Patient Position: Sitting, Cuff Size: Normal)   Pulse 71   Temp 98.4 F (36.9 C) (Temporal)   Resp 18   Wt 123 lb 6.4 oz (56 kg)   SpO2 100%   BMI 20.53 kg/m  Body mass index is 20.53 kg/m. Gen: Afebrile. No acute distress.  Nontoxic presentation. HENT: AT. [place].  Eyes:Pupils Equal Round Reactive to light, Extraocular movements intact,  Conjunctiva without redness, discharge or icterus. Neuro:  Normal gait. PERLA. EOMi. Alert. Oriented x3  Psych: Normal affect, dress and demeanor. Normal speech. Normal thought content and judgment.   No exam data present No results found. Results for orders placed or performed in visit on 02/22/19 (from the past 24 hour(s))  POCT urine pregnancy     Status: None   Collection Time: 02/22/19  8:33 AM  Result Value Ref Range   Preg Test, Ur Negative Negative    Assessment/Plan: Terri [name] is a 34 y.o. female present for OV for  Chronic right hip pain/Encounter for chronic pain management/Peri-trochanteric right hip tendinitis/Trochanteric bursitis, right hip/Trigger point of right side of body/Tear of right acetabular labrum, sequela/Chronic pain syndrome -Stable today on current regimen. - She unfortunately has not had a decrease in her pain despite attempts at surgical treatment. -[place] controlled substance database reviewed 02/22/19.  And is appropriate. - Norco 10-325 mg twice daily prescribed #60, with 2 additional refills called in. - UDS UTD 11/2018 -Patient is aware she will need to follow-up every 3 months face to face with this provider only to continue chronic pain management.  Patient was encouraged to keep chronic pain appointments separate from acute issues.  sleep disorder -Discussed options with her and restart Ambien for her today.  Controlled substance database reviewed. -Follow-up  face-to-face every 6  months.   Encounter for Depo-Provera contraception - POCT urine pregnancy> negative. -Depo-Medrol injection provided today.  -Follow-up for nurse visit between March 29-April 12 for repeat injection.   Reviewed expectations re: course of current medical issues.  Discussed self-management of symptoms.  Outlined signs and symptoms indicating need for more acute intervention.  Patient verbalized understanding and all questions were answered.  Patient received an After-Visit Summary.   Orders Placed This Encounter  Procedures  . POCT urine pregnancy   Meds ordered this encounter  Medications  . zolpidem (AMBIEN CR) 6.25 MG CR tablet    Sig: Take 1 tablet (6.25 mg total) by mouth at bedtime as needed for sleep.    Dispense:  90 tablet    Refill:  1  . DISCONTD: HYDROcodone-acetaminophen (NORCO) 10-325 MG tablet    Sig: Take 1 tablet by mouth 2 (two) times daily.    Dispense:  60 tablet    Refill:  0    Do not refill until 02/22/2018  . clonazePAM (KLONOPIN) 0.5 MG tablet    Sig: Take 1 tablet (0.5 mg total) by mouth 2 (two) times daily as needed for anxiety.    Dispense:  60 tablet    Refill:  5  . HYDROcodone-acetaminophen (NORCO) 10-325 MG tablet    Sig: Take 1 tablet by mouth 2 (two) times daily.    Dispense:  60 tablet    Refill:  0    Do not refill until 03/23/2018  . medroxyPROGESTERone (DEPO-PROVERA) injection 150 mg     Note is dictated utilizing voice recognition software. Although note has been proof read prior to signing, occasional typographical errors still can be missed. If any questions arise, please do not hesitate to call for verification.   electronically signed by:  Felix Pacini, DO  [facility] - [place]

## 2019-02-23 ENCOUNTER — Encounter: Payer: Self-pay | Admitting: Family Medicine

## 2019-02-23 ENCOUNTER — Ambulatory Visit: Payer: 59 | Admitting: Family Medicine

## 2019-02-23 VITALS — BP 86/60 | HR 88 | Temp 98.3°F | Resp 16 | Ht 65.0 in | Wt 123.1 lb

## 2019-02-23 DIAGNOSIS — G479 Sleep disorder, unspecified: Secondary | ICD-10-CM

## 2019-02-23 DIAGNOSIS — F418 Other specified anxiety disorders: Secondary | ICD-10-CM

## 2019-02-23 DIAGNOSIS — F410 Panic disorder [episodic paroxysmal anxiety] without agoraphobia: Secondary | ICD-10-CM | POA: Diagnosis not present

## 2019-02-23 DIAGNOSIS — F411 Generalized anxiety disorder: Secondary | ICD-10-CM

## 2019-02-23 MED ORDER — QUETIAPINE FUMARATE 50 MG PO TABS: 50.0000 mg | ORAL_TABLET | Freq: Every day | ORAL | 0 refills | Status: DC

## 2019-02-23 MED ORDER — VENLAFAXINE HCL ER 37.5 MG PO CP24: ORAL_CAPSULE | ORAL | 0 refills | Status: DC

## 2019-02-23 MED ORDER — VENLAFAXINE HCL ER 75 MG PO CP24: ORAL_CAPSULE | ORAL | 0 refills | Status: DC

## 2019-02-23 MED ORDER — HYDROCODONE-ACETAMINOPHEN 10-325 MG PO TABS: 1.0000 | ORAL_TABLET | Freq: Two times a day (BID) | ORAL | 0 refills | Status: DC

## 2019-02-23 NOTE — Addendum Note (Signed)
Addended by: Felix Pacini A on: 02/23/2019 05:52 PM   Modules accepted: Orders

## 2019-02-23 NOTE — Progress Notes (Signed)
Patient ID: Terri [name], female   DOB: February 11, 1986, 34 y.o.   MRN: [id number]    Terri [name], Terri [name] [birth year], 34 y.o., female MRN: [id number] Patient Care Team    Relationship Specialty Notifications Start End  Natalia Leatherwood, DO PCP - General Family Medicine  05/16/15   Ardell Isaacs, MD Consulting Physician Pain Medicine  03/17/13   Drema Dallas, DO Consulting Physician Neurology  03/24/15     Chief Complaint  Patient presents with  . Depression  . Anxiety    Subjective:  Terri [name] is a 34 y.o. female presents today for follow up Generalized anxiety disorder/Depression with anxiety/Panic disorder Sleep disorder Presents today to follow-up on her anxiety, depression and sleep disorder.  She reports she stopped using the majority of her medications over 6 months ago [place].  She states today that she feels she underestimated how much benefit she was getting from those medications and would like to restart.  Her last visit for these conditions she was prescribed Effexor 150 mg daily reported she was doing well on that medication.  She was also prescribed Remeron 30 mg nightly.  This did help her sleep but was not giving her much added benefit in the depression anxiety department.  She is prescribed Ambien for her sleep disorder, this does not always provide her a full nights rest.  She is also prescribed Klonopin for her panic attacks.  She states she rarely needs to use this medication but keeps it on backup in the event of a panic attack.  Initial note 09/18/2016: Pt presents for an OV with Increased complaint of anxiety and depression. She states over the last 2 months something has changed, she is not certain what it is and she is feeling depressed. She has been on medications in the past, and has seen counseling when she was a teenager. She reports she always felt like she really didn't need medications and could handle things on her own. Last noted she  was on Celexa a few years ago, and she felt like she didn't really need it and she was doing well. She reports this has changed over the last few months, she found that she is being very hard on herself and critical. She used to love to this dinner parties and be social with her family and friends, and now she doesn't enjoy as much because she is critical about what she says what she thinks she is cooking etc. She started a new job a few months ago, but states that she loves it, her boss is great since she does not think this is the source of her issue. She is prescribed Klonopin twice a day when necessary for her long-term anxiety, she in the past has infrequently uses. She states she has been using it regularly as scheduled last few weeks. She also has continued to use her Ambien when needed. She states she knows she has some guilt surrounding the child she put up for adoption about 9 years ago. Her family has contact with the child (daughter), but patient does not. She has always felt she has dealt with that situation well, and did it was all right for the child, but admits today maybe she is dealing with it as well as she thought.  Depression screen [facility] 2/9 02/23/2019 03/13/2018 08/29/2017 01/29/2017 10/25/2016  Decreased Interest 3 3 1 2  0  Down, Depressed, Hopeless 3 3 3 2  0  PHQ -  2 Score 6 6 4 4  0  Altered sleeping 3 3 3 3  0  Tired, decreased energy 3 3 3 2 1   Change in appetite 1 0 1 1 2   Feeling bad or failure about yourself  3 3 2 2  0  Trouble concentrating 1 2 1 1  0  Moving slowly or fidgety/restless 0 1 0 1 0  Suicidal thoughts 0 0 0 0 0  PHQ-9 Score 17 18 14 14 3   Difficult doing work/chores Very difficult Somewhat difficult Somewhat difficult - Not difficult at all   GAD 7 : Generalized Anxiety Score 02/23/2019 03/13/2018 08/29/2017 01/29/2017  Nervous, Anxious, on Edge 3 2 1 1   Control/stop worrying 1 3 0 3  Worry too much - different things 3 3 0 2  Trouble relaxing 1 3 1 2   Restless 3  2 1 1   Easily annoyed or irritable 1 2 0 1  Afraid - awful might happen 0 0 0 0  Total GAD 7 Score 12 15 3 10   Anxiety Difficulty Very difficult Somewhat difficult Somewhat difficult -    Allergies  Allergen Reactions  . Penicillins Hives and Rash    Has patient had a PCN reaction causing immediate rash, facial/tongue/throat swelling, SOB or lightheadedness with hypotension: Yes Has patient had a PCN reaction causing severe rash involving mucus membranes or skin necrosis: No Has patient had a PCN reaction that required hospitalization: No Has patient had a PCN reaction occurring within the last 10 years: No If all of the above answers are "NO", then may proceed with Cephalosporin use.;    Social History   Tobacco Use  . Smoking status: Former Smoker    Packs/day: 0.30    Types: Cigarettes    Quit date: 02/12/2007    Years since quitting: 12.0  . Smokeless tobacco: Never Used  Substance Use Topics  . Alcohol use: Yes    Comment: occasional   Past Medical History:  Diagnosis Date  . Allergic rhinitis 12/01/2014  . Allergy   . Brain injury [facility]) 34 yrs old   fell off babysitters cabinet, and on a separate occasion she fell down a flight of stairs  . Chronic pain disorder   . Chronic right hip pain   . Closed head injury About age 82 yrs   ICH per pt, with seizure disorder for appox 2 yrs afterward.  No significant residual disability.  . Depression with anxiety    Failed buspar.  Did well on citalopram.  . Drug overdose 2004   Xanax  . H/O varicella   . History of chlamydia   . HSV infection    Genital clx pos HSV 2--2007.  Marland Kitchen Hx of migraines    chronic migraines (Dr. Tomi Likens) 03/2015  . Hx: UTI (urinary tract infection) 09/06/10  . IBS (irritable bowel syndrome)   . Peri-trochanteric right hip tendinitis   . Right hip pain 05/2009   MRI 10/12/09: Femoral head and neck stress injury plus small acetabular labral tear.  [facility] ortho (Dr. Aretha Parrot) as of 11/2012.  Preferred pain  mgmt: steroid injection into right subtrochanteric bursa 12/06/15.  . Seizure ([place])    for 2 yrs after closed head injury/ICH  . Tear of right acetabular labrum   . Trochanteric bursitis of right hip    Past Surgical History:  Procedure Laterality Date  . HIP SURGERY  10/10/2010   Right hip GT bursectomy and osteophytectomy for intractable right greater troch pain (Dr. Maureen Ralphs).  [facility] second opinion  06/2012--resulted in repeat MRI, then right hip IA injection that gave mild short term response, then was referred to PT with iontophoresis (as of 09/07/12)--was told to f/u in 6 wks.  Marland Kitchen MOLE REMOVAL     on back  . WISDOM TOOTH EXTRACTION     Family History  Problem Relation Age of Onset  . Breast cancer Mother 71  . Thyroid cancer Mother   . Hepatitis Father        Hep C  . Alcohol abuse Father   . Thyroid disease Sister   . Anxiety disorder Sister   . Thyroid cancer Sister   . Thrombophlebitis Maternal Grandfather   . Anxiety disorder Maternal Grandfather   . Heart disease Maternal Grandfather   . Alzheimer's disease Paternal Grandmother    Allergies as of 02/23/2019      Reactions   Penicillins Hives, Rash   Has patient had a PCN reaction causing immediate rash, facial/tongue/throat swelling, SOB or lightheadedness with hypotension: Yes Has patient had a PCN reaction causing severe rash involving mucus membranes or skin necrosis: No Has patient had a PCN reaction that required hospitalization: No Has patient had a PCN reaction occurring within the last 10 years: No If all of the above answers are "NO", then may proceed with Cephalosporin use.;      Medication List       Accurate as of February 23, 2019  8:15 AM. If you have any questions, ask your nurse or doctor.        STOP taking these medications   albuterol 108 (90 Base) MCG/ACT inhaler Commonly known as: VENTOLIN HFA Stopped by: Felix Pacini, DO   omeprazole 20 MG capsule Commonly known as: PRILOSEC Stopped by:  Felix Pacini, DO     TAKE these medications   clonazePAM 0.5 MG tablet Commonly known as: KLONOPIN Take 1 tablet (0.5 mg total) by mouth 2 (two) times daily as needed for anxiety.   fluticasone 50 MCG/ACT nasal spray Commonly known as: FLONASE Place 2 sprays into both nostrils daily.   HYDROcodone-acetaminophen 10-325 MG tablet Commonly known as: NORCO Take 1 tablet by mouth 2 (two) times daily.   rizatriptan 10 MG tablet Commonly known as: MAXALT May repeat in 2 hours if needed--max of 30mg  in 24h period.   valACYclovir 1000 MG tablet Commonly known as: VALTREX Take 1 tablet (1,000 mg total) by mouth daily.   venlafaxine XR 150 MG 24 hr capsule Commonly known as: EFFEXOR-XR Take 1 capsule (150 mg total) by mouth daily with breakfast. Needs appt prior to any additional refills.   vitamin B-12 100 MCG tablet Commonly known as: CYANOCOBALAMIN Take 1 tablet (100 mcg total) by mouth daily.   zolpidem 6.25 MG CR tablet Commonly known as: AMBIEN CR Take 1 tablet (6.25 mg total) by mouth at bedtime as needed for sleep.       Results for orders placed or performed in visit on 02/22/19 (from the past 24 hour(s))  POCT urine pregnancy     Status: None   Collection Time: 02/22/19  8:33 AM  Result Value Ref Range   Preg Test, Ur Negative Negative   No results found.   ROS: Negative, with the exception of above mentioned in HPI  Objective:  BP (!) 86/60 (BP Location: Left Arm, Patient Position: Sitting, Cuff Size: Normal)   Pulse 88   Temp 98.3 F (36.8 C) (Temporal)   Resp 16   Ht 5\' 5"  (1.651 m)   Wt 123 lb  2 oz (55.8 kg)   SpO2 99%   BMI 20.49 kg/m  Body mass index is 20.49 kg/m. Gen: Afebrile. No acute distress.  HENT: AT. [place].  Eyes:Pupils Equal Round Reactive to light, Extraocular movements intact,  Conjunctiva without redness, discharge or icterus. CV: RRR  Neuro:  Normal gait. PERLA. EOMi. Alert. Oriented x3  Psych: Normal affect, dress and demeanor.  Normal speech. Normal thought content and judgment.   Assessment/Plan: Lucienne L Hendel is a 34 y.o. female present for acute OV for  Generalized anxiety disorder Depression with anxiety Panic disorder Sleep disorder -Discussed multiple different options with her today and it was agreed she will restart Effexor and new medication start of Seroquel nightly (taper 50-100 mg). -Patient now has an appointment with therapist to establish with "Selena Batten "at [facility]. - [place] database reviewed 02/23/19 - benzo/ambien- control substance contract signed -Restart Effexor with taper to Effexor 150 mg daily.  BS instructions provided on tapering. - continue Klonopin 0.5 mg twice a day when necessary for panic only. Patient does not use this medication often.  -Follow-up at 3-1/2-4 weeks.  If needed can increase Seroquel at that time.  Patient is aware to not use the Ambien while Seroquel is being tapered.  She may find she does not need the Ambien prescription any longer.    No orders of the defined types were placed in this encounter.  Meds ordered this encounter  Medications  . venlafaxine XR (EFFEXOR XR) 37.5 MG 24 hr capsule    Sig: Take 1 capsule (37.5 mg total) by mouth daily with breakfast for 7 days, THEN 2 capsules (75 mg total) daily with breakfast for 23 days.    Dispense:  53 capsule    Refill:  0  . venlafaxine XR (EFFEXOR XR) 75 MG 24 hr capsule    Sig: Take 1 capsule (75 mg total) by mouth daily with breakfast for 7 days, THEN 2 capsules (150 mg total) daily with breakfast for 23 days.    Dispense:  53 capsule    Refill:  0    Do not refill until 03/22/2019  . QUEtiapine (SEROQUEL) 50 MG tablet    Sig: Take 1-2 tablets (50-100 mg total) by mouth at bedtime.    Dispense:  60 tablet    Refill:  0       electronically signed by:  Felix Pacini, DO  [facility] - [place]

## 2019-02-23 NOTE — Patient Instructions (Signed)
I am glad you have scheduled yourself with a counselor.   Restart effexor taper 37.5 mg for 7 days, then 75 mg. 2nd bottle with be tapering from 75 mg to 150 mg you were on prior (instructions on label).  Start seroquel about 1 hour before bed. Start 1 tab> increase to 2 tabs after 1 week if needed only (goal: sleep- without sleep hangover and better control of anxiety). We will need to slowly taper this- once tolerance established if needed we can add a day dose also.    Avoid use of ambien- use for break through insomnia only.    Follow up in 3.5-4 weeks.

## 2019-03-17 ENCOUNTER — Other Ambulatory Visit: Payer: Self-pay | Admitting: Family Medicine

## 2019-04-02 ENCOUNTER — Ambulatory Visit: Payer: 59 | Admitting: Family Medicine

## 2019-04-21 ENCOUNTER — Ambulatory Visit: Payer: 59 | Admitting: Family Medicine

## 2019-04-21 ENCOUNTER — Other Ambulatory Visit: Payer: Self-pay

## 2019-04-21 ENCOUNTER — Encounter: Payer: Self-pay | Admitting: Family Medicine

## 2019-04-21 VITALS — BP 97/66 | HR 99 | Temp 98.3°F | Resp 16 | Ht 65.0 in | Wt 115.0 lb

## 2019-04-21 DIAGNOSIS — F411 Generalized anxiety disorder: Secondary | ICD-10-CM

## 2019-04-21 DIAGNOSIS — G479 Sleep disorder, unspecified: Secondary | ICD-10-CM

## 2019-04-21 DIAGNOSIS — F418 Other specified anxiety disorders: Secondary | ICD-10-CM | POA: Diagnosis not present

## 2019-04-21 MED ORDER — MIRTAZAPINE 30 MG PO TABS: 30.0000 mg | ORAL_TABLET | Freq: Every day | ORAL | 1 refills | Status: DC

## 2019-04-21 MED ORDER — VENLAFAXINE HCL ER 150 MG PO CP24: 150.0000 mg | ORAL_CAPSULE | Freq: Every day | ORAL | 1 refills | Status: DC

## 2019-04-21 NOTE — Progress Notes (Signed)
Patient ID: Terri [name], female   DOB: 1985/12/27, 34 y.o.   MRN: [id number]    Terri Shyleigh, [name] 02-Feb-1986, 34 y.o., female MRN: [id number] Patient Care Team    Relationship Specialty Notifications Start End  Natalia Leatherwood, DO PCP - General Family Medicine  05/16/15   Ardell Isaacs, MD Consulting Physician Pain Medicine  03/17/13   Drema Dallas, DO Consulting Physician Neurology  03/24/15     Chief Complaint  Patient presents with  . Anxiety    Pt is wanting to discuss medications and possibly stopping Seroquel.   . Depression  . Insomnia    Subjective:  Terri [name] is a 34 y.o. female presents today for follow up Generalized anxiety disorder/Depression with anxiety/Panic disorder Sleep disorder She reports the Seroquel 50 mg helped her sleep, but caused her to be too sleepy the next day. It helped with anxiety as well. She states she preferred her Remeron over the Seroquel. She has tapered up to the effexor 75 mg qd (prior dose 150). She states she does feel she needs to continue tapering to the 150 mg QD.  Overall much improved. She did use the [place] only after she discontinued the Seroquel.   Initial note 09/18/2016: Pt presents for an OV with Increased complaint of anxiety and depression. She states over the last 2 months something has changed, she is not certain what it is and she is feeling depressed. She has been on medications in the past, and has seen counseling when she was a teenager. She reports she always felt like she really didn't need medications and could handle things on her own. Last noted she was on Celexa a few years ago, and she felt like she didn't really need it and she was doing well. She reports this has changed over the last few months, she found that she is being very hard on herself and critical. She used to love to this dinner parties and be social with her family and friends, and now she doesn't enjoy as much because she is critical about what she says  what she thinks she is cooking etc. She started a new job a few months ago, but states that she loves it, her boss is great since she does not think this is the source of her issue. She is prescribed Klonopin twice a day when necessary for her long-term anxiety, she in the past has infrequently uses. She states she has been using it regularly as scheduled last few weeks. She also has continued to use her Ambien when needed. She states she knows she has some guilt surrounding the child she put up for adoption about 9 years ago. Her family has contact with the child (daughter), but patient does not. She has always felt she has dealt with that situation well, and did it was all right for the child, but admits today maybe she is dealing with it as well as she thought.  Depression screen [facility] 2/9 04/21/2019 02/23/2019 03/13/2018 08/29/2017 01/29/2017  Decreased Interest 3 3 3 1 2   Down, Depressed, Hopeless 3 3 3 3 2   PHQ - 2 Score 6 6 6 4 4   Altered sleeping 0 3 3 3 3   Tired, decreased energy 3 3 3 3 2   Change in appetite 3 1 0 1 1  Feeling bad or failure about yourself  3 3 3 2 2   Trouble concentrating 3 1 2 1 1   Moving slowly or fidgety/restless 0 0  1 0 1  Suicidal thoughts 1 0 0 0 0  PHQ-9 Score 19 17 18 14 14   Difficult doing work/chores Very difficult Very difficult Somewhat difficult Somewhat difficult -   GAD 7 : Generalized Anxiety Score 04/21/2019 02/23/2019 03/13/2018 08/29/2017  Nervous, Anxious, on Edge 0 3 2 1   Control/stop worrying 3 1 3  0  Worry too much - different things 3 3 3  0  Trouble relaxing 0 1 3 1   Restless 0 3 2 1   Easily annoyed or irritable 1 1 2  0  Afraid - awful might happen 0 0 0 0  Total GAD 7 Score 7 12 15 3   Anxiety Difficulty Extremely difficult Very difficult Somewhat difficult Somewhat difficult    Allergies  Allergen Reactions  . Penicillins Hives and Rash    Has patient had a PCN reaction causing immediate rash, facial/tongue/throat swelling, SOB or  lightheadedness with hypotension: Yes Has patient had a PCN reaction causing severe rash involving mucus membranes or skin necrosis: No Has patient had a PCN reaction that required hospitalization: No Has patient had a PCN reaction occurring within the last 10 years: No If all of the above answers are "NO", then may proceed with Cephalosporin use.;    Social History   Tobacco Use  . Smoking status: Former Smoker    Packs/day: 0.30    Types: Cigarettes    Quit date: 02/12/2007    Years since quitting: 12.1  . Smokeless tobacco: Never Used  Substance Use Topics  . Alcohol use: Yes    Comment: occasional   Past Medical History:  Diagnosis Date  . Allergic rhinitis 12/01/2014  . Allergy   . Brain injury [facility]) 34 yrs old   fell off babysitters cabinet, and on a separate occasion she fell down a flight of stairs  . Chronic pain disorder   . Chronic right hip pain   . Closed head injury About age 64 yrs   ICH per pt, with seizure disorder for appox 2 yrs afterward.  No significant residual disability.  . Depression with anxiety    Failed buspar.  Did well on citalopram.  . Drug overdose 2004   Xanax  . H/O varicella   . History of chlamydia   . HSV infection    Genital clx pos HSV 2--2007.  Marland Kitchen Hx of migraines    chronic migraines (Dr. Tomi Likens) 03/2015  . Hx: UTI (urinary tract infection) 09/06/10  . IBS (irritable bowel syndrome)   . Peri-trochanteric right hip tendinitis   . Right hip pain 05/2009   MRI 10/12/09: Femoral head and neck stress injury plus small acetabular labral tear.  [facility] ortho (Dr. Aretha Parrot) as of 11/2012.  Preferred pain mgmt: steroid injection into right subtrochanteric bursa 12/06/15.  . Seizure ([place])    for 2 yrs after closed head injury/ICH  . Tear of right acetabular labrum   . Trochanteric bursitis of right hip    Past Surgical History:  Procedure Laterality Date  . HIP SURGERY  10/10/2010   Right hip GT bursectomy and osteophytectomy for intractable right  greater troch pain (Dr. Maureen Ralphs).  [facility] second opinion 06/2012--resulted in repeat MRI, then right hip IA injection that gave mild short term response, then was referred to PT with iontophoresis (as of 09/07/12)--was told to f/u in 6 wks.  Marland Kitchen MOLE REMOVAL     on back  . WISDOM TOOTH EXTRACTION     Family History  Problem Relation Age of Onset  . Breast cancer  Mother 69  . Thyroid cancer Mother   . Hepatitis Father        Hep C  . Alcohol abuse Father   . Thyroid disease Sister   . Anxiety disorder Sister   . Thyroid cancer Sister   . Thrombophlebitis Maternal Grandfather   . Anxiety disorder Maternal Grandfather   . Heart disease Maternal Grandfather   . Alzheimer's disease Paternal Grandmother     No results found for this or any previous visit (from the past 24 hour(s)). No results found.   ROS: Negative, with the exception of above mentioned in HPI  Objective:  BP 97/66 (BP Location: Left Arm, Patient Position: Sitting, Cuff Size: Normal)   Pulse 99   Temp 98.3 F (36.8 C) (Temporal)   Resp 16   Ht 5\' 5"  (1.651 m)   Wt 115 lb (52.2 kg)   LMP 04/21/2019   SpO2 98%   BMI 19.14 kg/m  Body mass index is 19.14 kg/m. Gen: Afebrile. No acute distress. Pleasant caucasian female.  HENT: AT. [place].  Eyes:Pupils Equal Round Reactive to light, Extraocular movements intact,  Conjunctiva without redness, discharge or icterus. CV: RRR Chest: CTAB, no wheeze or crackles Neuro:  Normal gait. PERLA. EOMi. Alert. Oriented x3 Psych: Normal affect, dress and demeanor. Normal speech. Normal thought content and judgment.  Assessment/Plan: Tiyonna L Huffaker is a 34 y.o. female present for acute OV for  Generalized anxiety disorder Depression with anxiety Panic disorder Sleep disorder - anxiety and depression improving as meds are being restarted -Patient now established with   [facility]. - [facility] database reviewed 04/21/19 - benzo/ambien- control substance contract signed - continue  Effexor with taper to Effexor 150 mg daily - continue Klonopin 0.5 mg twice a day when necessary for panic only. - start Remeron taper to 30 mg QHS. Patient is aware to not use the Ambien while on remeron  F/U prior to 6 months on above conditions.    No orders of the defined types were placed in this encounter.  Meds ordered this encounter  Medications  . mirtazapine (REMERON) 30 MG tablet    Sig: Take 1 tablet (30 mg total) by mouth at bedtime.    Dispense:  90 tablet    Refill:  1  . venlafaxine XR (EFFEXOR XR) 150 MG 24 hr capsule    Sig: Take 1 capsule (150 mg total) by mouth daily with breakfast.    Dispense:  90 capsule    Refill:  1       electronically signed by:  06/21/19, DO  [facility] - [place]

## 2019-04-21 NOTE — Patient Instructions (Signed)
COVID-19 Vaccine Information can be found at: [email or web address] For questions related to vaccine distribution or appointments, please email [email or web address] or call [phone].  Covid Vaccine appointment go to [email or web address].   6 months follow up.

## 2019-06-01 ENCOUNTER — Ambulatory Visit: Payer: 59 | Admitting: Family Medicine

## 2019-06-01 DIAGNOSIS — Z0289 Encounter for other administrative examinations: Secondary | ICD-10-CM

## 2019-06-03 ENCOUNTER — Emergency Department (HOSPITAL_BASED_OUTPATIENT_CLINIC_OR_DEPARTMENT_OTHER)
Admission: EM | Admit: 2019-06-03 | Discharge: 2019-06-03 | Disposition: A | Discharge status: Home / Self Care | Payer: 59 | Attending: Emergency Medicine | Admitting: Emergency Medicine

## 2019-06-03 ENCOUNTER — Encounter (HOSPITAL_BASED_OUTPATIENT_CLINIC_OR_DEPARTMENT_OTHER): Payer: Self-pay

## 2019-06-03 ENCOUNTER — Emergency Department (HOSPITAL_BASED_OUTPATIENT_CLINIC_OR_DEPARTMENT_OTHER): Payer: 59

## 2019-06-03 ENCOUNTER — Other Ambulatory Visit: Payer: Self-pay

## 2019-06-03 DIAGNOSIS — M545 Low back pain: Secondary | ICD-10-CM | POA: Insufficient documentation

## 2019-06-03 DIAGNOSIS — Z87891 Personal history of nicotine dependence: Secondary | ICD-10-CM | POA: Diagnosis not present

## 2019-06-03 DIAGNOSIS — Y9301 Activity, walking, marching and hiking: Secondary | ICD-10-CM | POA: Insufficient documentation

## 2019-06-03 DIAGNOSIS — W108XXA Fall (on) (from) other stairs and steps, initial encounter: Secondary | ICD-10-CM | POA: Insufficient documentation

## 2019-06-03 DIAGNOSIS — Y999 Unspecified external cause status: Secondary | ICD-10-CM | POA: Diagnosis not present

## 2019-06-03 DIAGNOSIS — S300XXA Contusion of lower back and pelvis, initial encounter: Secondary | ICD-10-CM

## 2019-06-03 DIAGNOSIS — Y929 Unspecified place or not applicable: Secondary | ICD-10-CM | POA: Insufficient documentation

## 2019-06-03 DIAGNOSIS — W19XXXA Unspecified fall, initial encounter: Secondary | ICD-10-CM

## 2019-06-03 LAB — URINALYSIS, ROUTINE W REFLEX MICROSCOPIC
Bilirubin Urine: NEGATIVE
Glucose, UA: NEGATIVE mg/dL
Ketones, ur: NEGATIVE mg/dL
Nitrite: NEGATIVE
Protein, ur: NEGATIVE mg/dL
Specific Gravity, Urine: >1.03 — ABNORMAL HIGH (ref 1.005–1.030)
pH: 5.5 (ref 5.0–8.0)

## 2019-06-03 LAB — URINALYSIS, MICROSCOPIC (REFLEX)

## 2019-06-03 LAB — PREGNANCY, URINE: Preg Test, Ur: NEGATIVE

## 2019-06-03 MED ORDER — HYDROCODONE-ACETAMINOPHEN 5-325 MG PO TABS: 2.0000 | ORAL_TABLET | ORAL | 0 refills | Status: DC | PRN

## 2019-06-03 NOTE — ED Provider Notes (Signed)
[facility] EMERGENCY DEPARTMENT Provider Note   CSN: [id number] Arrival date & time: 06/03/19  1116     History Chief Complaint  Patient presents with  . Fall    Terri [name] is a 34 y.o. female.  Patient is a 34 year old female with history of migraines, chronic hip pain status post surgery x2 in the past.  She presents today for evaluation of fall.  Patient states she was walking down the stairs yesterday when she slipped and landed on her buttocks.  She is complaining of pain in her low back.  There is no radiation into her legs and no bowel or bladder complaints.  She denies other injury.  Her pain is worse when she ambulates and bends.  The history is provided by the patient.  Fall This is a new problem. The current episode started yesterday. The problem occurs constantly. The problem has not changed since onset.The symptoms are aggravated by twisting and bending. Nothing relieves the symptoms. She has tried nothing for the symptoms.       Past Medical History:  Diagnosis Date  . Allergic rhinitis 12/01/2014  . Allergy   . Brain injury [facility]) 34 yrs old   fell off babysitters cabinet, and on a separate occasion she fell down a flight of stairs  . Chronic pain disorder   . Chronic right hip pain   . Closed head injury About age 25 yrs   ICH per pt, with seizure disorder for appox 2 yrs afterward.  No significant residual disability.  . Depression with anxiety    Failed buspar.  Did well on citalopram.  . Drug overdose 2004   Xanax  . H/O varicella   . History of chlamydia   . HSV infection    Genital clx pos HSV 2--2007.  Marland Kitchen Hx of migraines    chronic migraines (Dr. Everlena Cooper) 03/2015  . Hx: UTI (urinary tract infection) 09/06/10  . IBS (irritable bowel syndrome)   . Peri-trochanteric right hip tendinitis   . Right hip pain 05/2009   MRI 10/12/09: Femoral head and neck stress injury plus small acetabular labral tear.  [facility] ortho (Dr. Caswell Corwin) as of 11/2012.   Preferred pain mgmt: steroid injection into right subtrochanteric bursa 12/06/15.  . Seizure ([facility])    for 2 yrs after closed head injury/ICH  . Tear of right acetabular labrum   . Trochanteric bursitis of right hip     Patient Active Problem List   Diagnosis Date Noted  . Encounter for chronic pain management 11/30/2018  . Chronic pain syndrome 11/30/2018  . Peri-trochanteric right hip tendinitis 11/30/2018  . Trochanteric bursitis, right hip 11/30/2018  . Chronic right hip pain 11/30/2018  . Tear of right acetabular labrum 11/30/2018  . Seizure disorder ([facility]) 11/26/2018  . Early satiety 11/25/2018  . Frequent UTI 07/02/2018  . Sleep disorder 09/18/2016  . Genital HSV 08/24/2015  . History of traumatic brain injury 05/30/2015  . Vitamin D deficiency 05/30/2015  . Migraine 01/24/2015  . IBS (irritable bowel syndrome) 10/10/2014  . Trigger point of right side of body 05/09/2014  . Contraceptive management 12/17/2011  . Panic disorder 07/05/2010  . Generalized anxiety disorder 07/05/2010  . Depression with anxiety 07/05/2010  . Right hip pain 07/05/2010    Past Surgical History:  Procedure Laterality Date  . HIP SURGERY  10/10/2010   Right hip GT bursectomy and osteophytectomy for intractable right greater troch pain (Dr. Despina Hick).  [facility] second opinion 06/2012--resulted in repeat MRI,  then right hip IA injection that gave mild short term response, then was referred to PT with iontophoresis (as of 09/07/12)--was told to f/u in 6 wks.  Marland Kitchen MOLE REMOVAL     on back  . WISDOM TOOTH EXTRACTION       OB History    Gravida  2   Para  2   Term  2   Preterm      AB      Living  1     SAB      TAB      Ectopic      Multiple      Live Births  1           Family History  Problem Relation Age of Onset  . Breast cancer Mother 4  . Thyroid cancer Mother   . Hepatitis Father        Hep C  . Alcohol abuse Father   . Thyroid disease Sister   . Anxiety disorder  Sister   . Thyroid cancer Sister   . Thrombophlebitis Maternal Grandfather   . Anxiety disorder Maternal Grandfather   . Heart disease Maternal Grandfather   . Alzheimer's disease Paternal Grandmother     Social History   Tobacco Use  . Smoking status: Former Smoker    Packs/day: 0.30    Types: Cigarettes    Quit date: 02/12/2007    Years since quitting: 12.3  . Smokeless tobacco: Never Used  Substance Use Topics  . Alcohol use: Yes    Comment: occasional  . Drug use: No    Home Medications Prior to Admission medications   Medication Sig Start Date End Date Taking? Authorizing Provider  clonazePAM (KLONOPIN) 0.5 MG tablet Take 1 tablet (0.5 mg total) by mouth 2 (two) times daily as needed for anxiety. 02/22/19   Kuneff, Renee A, DO  fluticasone (FLONASE) 50 MCG/ACT nasal spray Place 2 sprays into both nostrils daily. 12/08/18   Daphine Deutscher Mary-Margaret, FNP  HYDROcodone-acetaminophen (NORCO) 10-325 MG tablet Take 1 tablet by mouth 2 (two) times daily. 02/23/19   Kuneff, Renee A, DO  mirtazapine (REMERON) 30 MG tablet Take 1 tablet (30 mg total) by mouth at bedtime. 04/21/19   Kuneff, Renee A, DO  rizatriptan (MAXALT) 10 MG tablet May repeat in 2 hours if needed--max of 30mg  in 24h period. 12/20/15   Kuneff, Renee A, DO  valACYclovir (VALTREX) 1000 MG tablet Take 1 tablet (1,000 mg total) by mouth daily. 12/07/18   Kuneff, Renee A, DO  venlafaxine XR (EFFEXOR XR) 150 MG 24 hr capsule Take 1 capsule (150 mg total) by mouth daily with breakfast. 04/21/19   Kuneff, Renee A, DO  vitamin B-12 (CYANOCOBALAMIN) 100 MCG tablet Take 1 tablet (100 mcg total) by mouth daily. 01/25/15   Kuneff, Renee A, DO  zolpidem (AMBIEN CR) 6.25 MG CR tablet Take 1 tablet (6.25 mg total) by mouth at bedtime as needed for sleep. 02/22/19   Kuneff, Renee A, DO    Allergies    Penicillins  Review of Systems   Review of Systems  All other systems reviewed and are negative.   Physical Exam Updated Vital  Signs BP 108/79 (BP Location: Left Arm)   Pulse (!) 102   Temp 98 F (36.7 C) (Oral)   Resp 18   Ht 5\' 4"  (1.626 m)   Wt 54.4 kg   SpO2 97%   BMI 20.60 kg/m   Physical Exam Vitals and nursing note reviewed.  Constitutional:      General: She is not in acute distress.    Appearance: She is well-developed. She is not diaphoretic.  HENT:     Head: Normocephalic and atraumatic.  Cardiovascular:     Rate and Rhythm: Normal rate and regular rhythm.     Heart sounds: No murmur. No friction rub. No gallop.   Pulmonary:     Effort: Pulmonary effort is normal. No respiratory distress.     Breath sounds: Normal breath sounds. No wheezing.  Abdominal:     General: Bowel sounds are normal. There is no distension.     Palpations: Abdomen is soft.     Tenderness: There is no abdominal tenderness.  Musculoskeletal:        General: Normal range of motion.     Cervical back: Normal range of motion and neck supple.     Comments: There is tenderness to palpation in the lower lumbar region.  There is no bony tenderness or step-off.  Skin:    General: Skin is warm and dry.  Neurological:     Mental Status: She is alert and oriented to person, place, and time.     Comments: Strength is 5 out of 5 in both lower extremities.  DTRs are 3+ and symmetrical in the patellar and Achilles tendons bilaterally.  Motor and sensation are intact to both legs.     ED Results / Procedures / Treatments   Labs (all labs ordered are listed, but only abnormal results are displayed) Labs Reviewed  PREGNANCY, URINE  URINALYSIS, ROUTINE W REFLEX MICROSCOPIC    EKG None  Radiology No results found.  Procedures Procedures (including critical care time)  Medications Ordered in ED Medications - No data to display  ED Course  I have reviewed the triage vital signs and the nursing notes.  Pertinent labs & imaging results that were available during my care of the patient were reviewed by me and considered  in my medical decision making (see chart for details).    MDM Rules/Calculators/A&P  Patient is a 34 year old female with past medical history of chronic hip pain presenting with complaints of fall.  This occurred yesterday evening.  She slipped on the steps and landed on her buttock.  She is complaining of pain in the lumbar region with no radiation to the legs.  No bowel or bladder complaints.    Patient does have tenderness in the lower lumbar region, however there is no step-off.  X-rays are negative for fracture.  At this point, I feel as though discharge is appropriate.  Patient takes hydrocodone, but is out of this medication.  She will be prescribed a small quantity which she can take until she follows up with her primary doctor.  [employer] has been reviewed.  Patient has received monthly hydrocodone from a single provider, but no other concerning pattern.  Final Clinical Impression(s) / ED Diagnoses Final diagnoses:  None    Rx / DC Orders ED Discharge Orders    None       Veryl Speak, MD 06/03/19 1314

## 2019-06-03 NOTE — ED Triage Notes (Signed)
Pt states she fell down steps yesterday-pain to left mid/lower back pain-NAD-slow gait

## 2019-06-24 ENCOUNTER — Encounter: Payer: Self-pay | Admitting: Family Medicine

## 2019-06-24 ENCOUNTER — Other Ambulatory Visit: Payer: Self-pay

## 2019-06-24 ENCOUNTER — Ambulatory Visit: Payer: 59 | Admitting: Family Medicine

## 2019-06-24 VITALS — BP 106/64 | HR 102 | Temp 98.3°F | Resp 19 | Ht 65.0 in | Wt 123.2 lb

## 2019-06-24 DIAGNOSIS — M25551 Pain in right hip: Secondary | ICD-10-CM

## 2019-06-24 DIAGNOSIS — G8929 Other chronic pain: Secondary | ICD-10-CM

## 2019-06-24 DIAGNOSIS — Z3042 Encounter for surveillance of injectable contraceptive: Secondary | ICD-10-CM

## 2019-06-24 DIAGNOSIS — M7061 Trochanteric bursitis, right hip: Secondary | ICD-10-CM

## 2019-06-24 DIAGNOSIS — G894 Chronic pain syndrome: Secondary | ICD-10-CM

## 2019-06-24 DIAGNOSIS — S73191S Other sprain of right hip, sequela: Secondary | ICD-10-CM

## 2019-06-24 LAB — POCT URINE PREGNANCY: Preg Test, Ur: NEGATIVE

## 2019-06-24 MED ORDER — HYDROCODONE-ACETAMINOPHEN 10-325 MG PO TABS: 1.0000 | ORAL_TABLET | Freq: Three times a day (TID) | ORAL | 0 refills | Status: DC | PRN

## 2019-06-24 MED ORDER — MEDROXYPROGESTERONE ACETATE 150 MG/ML IM SUSP
150.0000 mg | Freq: Once | INTRAMUSCULAR | Status: AC
  Administered 2019-06-24: 150 mg via INTRAMUSCULAR

## 2019-06-24 NOTE — Progress Notes (Signed)
Terri [name] , 1986/01/29, 34 y.o., female MRN: [id number] Patient Care Team    Relationship Specialty Notifications Start End  Natalia Leatherwood, DO PCP - General Family Medicine  05/16/15   Ardell Isaacs, MD Consulting Physician Pain Medicine  03/17/13   Drema Dallas, DO Consulting Physician Neurology  03/24/15     Chief Complaint  Patient presents with  . pain management    Pt would like to go to 1 tablet TID.      Subjective: Terri [name] is a 34 y.o. female present for her chronic pain management appt.   Patient has been following with pain management for her chronic musculoskeletal complaints concerning her hip pain/chronic pain syndrome.  She has chronic right hip pain secondary to right hip tendinitis and chronic pain secondary to right acetabular labrum tear.  She has undergone surgical procedures for her hip as well.  Unfortunately, it has not increased her quality of life or control her pain.   She reports chronic pain with daily use such as walking, climbing stairs etc.  She states that she would try hiking or any physical sport she would have severe pain.   She states the pain is worse with external rotation and flexion of her hip.  She has been prescribed Norco 10-325 3 times daily as needed through her pain specialist.  Her pain had been more controlled and she was only needing 1-2 tabs daily, and he will the last few months.  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 Indication for chronic opioid: Chronic right hip pain labrum tear Medication and dose: Norco 10-3 25 twice daily # pills per month: 90 Last UDS date: Collected today Pain contract signed (Y/N): y Date narcotic database last reviewed (include red flags): 06/24/19    Depo provera:  Patient is overdue for her Depo-Provera shot.  She would like her shot completed today.   Depression screen [facility] 2/9  04/21/2019 02/23/2019 03/13/2018 08/29/2017 01/29/2017  Decreased Interest 3 3 3 1 2   Down, Depressed, Hopeless 3 3 3 3 2   PHQ - 2 Score 6 6 6 4 4   Altered sleeping 0 3 3 3 3   Tired, decreased energy 3 3 3 3 2   Change in appetite 3 1 0 1 1  Feeling bad or failure about yourself  3 3 3 2 2   Trouble concentrating 3 1 2 1 1   Moving slowly or fidgety/restless 0 0 1 0 1  Suicidal thoughts 1 0 0 0 0  PHQ-9 Score 19 17 18 14 14   Difficult doing work/chores Very difficult Very difficult Somewhat difficult Somewhat difficult -    Allergies  Allergen Reactions  . Penicillins Hives and Rash    Has patient had a PCN reaction causing immediate rash, facial/tongue/throat swelling, SOB or lightheadedness with hypotension: Yes Has patient had a PCN reaction causing severe rash involving mucus membranes or skin necrosis: No Has patient had a PCN reaction that required hospitalization: No Has patient had a PCN reaction occurring within the last 10 years: No If all of the above answers are "NO", then may proceed with Cephalosporin use.;    Social History   Social History Narrative   Single.     Lives in [place] with her mom and dad and her 28 y/o son .   Works as a with mental/developmental disabilities.  Describes somewhat wild teenage years, "made some bad choices and hung out with the wrong people".  However, after that she had no issues with drugs or alcohol.   Reports that she had a second child a couple of years after her son [name] (2008) and she gave him/her up for adoption right after birth.                 Past Medical History:  Diagnosis Date  . Allergic rhinitis 12/01/2014  . Allergy   . Brain injury [facility]) 34 yrs old   fell off babysitters cabinet, and on a separate occasion she fell down a flight of stairs  . Chronic pain disorder   . Chronic right hip pain   . Closed head injury About age 51 yrs   ICH per pt, with seizure disorder for appox 2  yrs afterward.  No significant residual disability.  . Depression with anxiety    Failed buspar.  Did well on citalopram.  . Drug overdose 2004   Xanax  . H/O varicella   . History of chlamydia   . HSV infection    Genital clx pos HSV 2--2007.  Marland Kitchen Hx of migraines    chronic migraines (Dr. Everlena Cooper) 03/2015  . Hx: UTI (urinary tract infection) 09/06/10  . IBS (irritable bowel syndrome)   . Peri-trochanteric right hip tendinitis   . Right hip pain 05/2009   MRI 10/12/09: Femoral head and neck stress injury plus small acetabular labral tear.  [facility] ortho (Dr. Caswell Corwin) as of 11/2012.  Preferred pain mgmt: steroid injection into right subtrochanteric bursa 12/06/15.  . Seizure (HCC)    for 2 yrs after closed head injury/ICH  . Tear of right acetabular labrum   . Trochanteric bursitis of right hip    Past Surgical History:  Procedure Laterality Date  . HIP SURGERY  10/10/2010   Right hip GT bursectomy and osteophytectomy for intractable right greater troch pain (Dr. Despina Hick).  [facility] second opinion 06/2012--resulted in repeat MRI, then right hip IA injection that gave mild short term response, then was referred to PT with iontophoresis (as of 09/07/12)--was told to f/u in 6 wks.  Marland Kitchen MOLE REMOVAL     on back  . WISDOM TOOTH EXTRACTION     Family History  Problem Relation Age of Onset  . Breast cancer Mother 85  . Thyroid cancer Mother   . Hepatitis Father        Hep C  . Alcohol abuse Father   . Thyroid disease Sister   . Anxiety disorder Sister   . Thyroid cancer Sister   . Thrombophlebitis Maternal Grandfather   . Anxiety disorder Maternal Grandfather   . Heart disease Maternal Grandfather   . Alzheimer's disease Paternal Grandmother    Allergies as of 06/24/2019      Reactions   Penicillins Hives, Rash   Has patient had a PCN reaction causing immediate rash, facial/tongue/throat swelling, SOB or lightheadedness with hypotension: Yes Has patient had a PCN reaction causing severe rash  involving mucus membranes or skin necrosis: No Has patient had a PCN reaction that required hospitalization: No Has patient had a PCN reaction occurring within the last 10 years: No If all of the above answers are "NO", then may proceed with Cephalosporin use.;      Medication List       Accurate as of Jun 24, 2019 10:46 AM. If you have any questions, ask your nurse or doctor.  clonazePAM 0.5 MG tablet Commonly known as: KLONOPIN Take 1 tablet (0.5 mg total) by mouth 2 (two) times daily as needed for anxiety.   fluticasone 50 MCG/ACT nasal spray Commonly known as: FLONASE Place 2 sprays into both nostrils daily.   HYDROcodone-acetaminophen 10-325 MG tablet Commonly known as: NORCO Take 1 tablet by mouth 2 (two) times daily. What changed: Another medication with the same name was removed. Continue taking this medication, and follow the directions you see here. Changed by: Howard Pouch, DO   mirtazapine 30 MG tablet Commonly known as: Remeron Take 1 tablet (30 mg total) by mouth at bedtime.   rizatriptan 10 MG tablet Commonly known as: MAXALT May repeat in 2 hours if needed--max of 30mg  in 21H period.   valACYclovir 1000 MG tablet Commonly known as: VALTREX Take 1 tablet (1,000 mg total) by mouth daily.   venlafaxine XR 150 MG 24 hr capsule Commonly known as: Effexor XR Take 1 capsule (150 mg total) by mouth daily with breakfast.   vitamin B-12 100 MCG tablet Commonly known as: CYANOCOBALAMIN Take 1 tablet (100 mcg total) by mouth daily.   zolpidem 6.25 MG CR tablet Commonly known as: AMBIEN CR Take 1 tablet (6.25 mg total) by mouth at bedtime as needed for sleep.       All past medical history, surgical history, allergies, family history, immunizations andmedications were updated in the EMR today and reviewed under the history and medication portions of their EMR.     ROS: Negative, with the exception of above mentioned in HPI   Objective:  BP 106/64  (BP Location: Left Arm, Patient Position: Sitting, Cuff Size: Normal)   Pulse (!) 102   Temp 98.3 F (36.8 C) (Temporal)   Resp 19   Ht 5\' 5"  (1.651 m)   Wt 123 lb 4 oz (55.9 kg)   SpO2 97%   BMI 20.51 kg/m  Body mass index is 20.51 kg/m. Gen: Afebrile. No acute distress.  HENT: AT. [place].  Eyes:Pupils Equal Round Reactive to light, Extraocular movements intact,  Conjunctiva without redness, discharge or icterus. Neuro:  Normal gait. PERLA. EOMi. Alert. Oriented x3  Psych: Normal affect, dress and demeanor. Normal speech. Normal thought content and judgment.   No exam data present No results found. Results for orders placed or performed in visit on 06/24/19 (from the past 24 hour(s))  POCT urine pregnancy     Status: None   Collection Time: 06/24/19 10:42 AM  Result Value Ref Range   Preg Test, Ur Negative Negative    Assessment/Plan: Terri [name] is a 34 y.o. female present for OV for  Chronic right hip pain/Encounter for chronic pain management/Peri-trochanteric right hip tendinitis/Trochanteric bursitis, right hip/Trigger point of right side of body/Tear of right acetabular labrum, sequela/Chronic pain syndrome She has a Psychologist, sport and exercise in which she follows with for her conditions.  She unfortunately has not had a decrease in her pain despite attempts at surgical treatment. -[place] controlled substance database reviewed today and nd is appropriate. - Norco 10-325 mg 3 times daily as needed prescribed #90, with 2 additional refills called in. - Pain Mgmt, Profile 8 w/Conf, U -Patient is aware she will need to follow-up every 3 months face to face with this provider only to continue chronic pain management.   Depo-Provera: Pregnancy test is negative. Depo-Provera shot provided to her today. Repeat shot every 12-13 weeks    Reviewed expectations re: course of current medical issues.  Discussed self-management of symptoms.  Outlined signs and symptoms indicating need for  more acute intervention.  Patient verbalized understanding and all questions were answered.  Patient received an After-Visit Summary.    Orders Placed This Encounter  Procedures  . DRUG MONITORING, PANEL 8 WITH CONFIRMATION, URINE  . POCT urine pregnancy   Meds ordered this encounter  Medications  . DISCONTD: HYDROcodone-acetaminophen (NORCO) 10-325 MG tablet    Sig: Take 1 tablet by mouth 3 (three) times daily as needed.    Dispense:  90 tablet    Refill:  0  . DISCONTD: HYDROcodone-acetaminophen (NORCO) 10-325 MG tablet    Sig: Take 1 tablet by mouth 3 (three) times daily as needed. May refill after 07/22/2019    Dispense:  90 tablet    Refill:  0  . HYDROcodone-acetaminophen (NORCO) 10-325 MG tablet    Sig: Take 1 tablet by mouth 3 (three) times daily as needed.    Dispense:  90 tablet    Refill:  0    May refill after 08/18/2019  . medroxyPROGESTERone (DEPO-PROVERA) injection 150 mg   Referral Orders  No referral(s) requested today    Note is dictated utilizing voice recognition software. Although note has been proof read prior to signing, occasional typographical errors still can be missed. If any questions arise, please do not hesitate to call for verification.   electronically signed by:  Felix Pacini, DO  [facility] [place]

## 2019-06-26 LAB — DRUG MONITORING, PANEL 8 WITH CONFIRMATION, URINE
6 Acetylmorphine: NEGATIVE ng/mL (ref <10)
Alcohol Metabolites: POSITIVE ng/mL — AB
Amphetamines: NEGATIVE ng/mL (ref <500)
Benzodiazepines: NEGATIVE ng/mL (ref <100)
Buprenorphine, Urine: NEGATIVE ng/mL (ref <5)
Cocaine Metabolite: NEGATIVE ng/mL (ref <150)
Codeine: NEGATIVE ng/mL (ref <50)
Creatinine: 108.1 mg/dL
Ethyl Glucuronide (ETG): 1639 ng/mL — ABNORMAL HIGH (ref <500)
Ethyl Sulfate (ETS): 317 ng/mL — ABNORMAL HIGH (ref <100)
Hydrocodone: 988 ng/mL — ABNORMAL HIGH (ref <50)
Hydromorphone: 395 ng/mL — ABNORMAL HIGH (ref <50)
MDMA: NEGATIVE ng/mL (ref <500)
Marijuana Metabolite: NEGATIVE ng/mL (ref <20)
Morphine: NEGATIVE ng/mL (ref <50)
Norhydrocodone: 1163 ng/mL — ABNORMAL HIGH (ref <50)
Opiates: POSITIVE ng/mL — AB (ref <100)
Oxidant: NEGATIVE ug/mL
Oxycodone: NEGATIVE ng/mL (ref <100)
pH: 5.3 (ref 4.5–9.0)

## 2019-06-26 LAB — DM TEMPLATE

## 2019-07-18 DIAGNOSIS — Z20822 Contact with and (suspected) exposure to covid-19: Secondary | ICD-10-CM | POA: Diagnosis not present

## 2019-07-18 DIAGNOSIS — Z03818 Encounter for observation for suspected exposure to other biological agents ruled out: Secondary | ICD-10-CM | POA: Diagnosis not present

## 2019-07-19 DIAGNOSIS — F322 Major depressive disorder, single episode, severe without psychotic features: Secondary | ICD-10-CM | POA: Diagnosis not present

## 2019-07-19 DIAGNOSIS — F411 Generalized anxiety disorder: Secondary | ICD-10-CM | POA: Diagnosis not present

## 2019-07-19 DIAGNOSIS — F332 Major depressive disorder, recurrent severe without psychotic features: Secondary | ICD-10-CM | POA: Diagnosis not present

## 2019-07-19 DIAGNOSIS — Z5181 Encounter for therapeutic drug level monitoring: Secondary | ICD-10-CM | POA: Diagnosis not present

## 2019-07-20 DIAGNOSIS — F332 Major depressive disorder, recurrent severe without psychotic features: Secondary | ICD-10-CM | POA: Diagnosis not present

## 2019-07-20 DIAGNOSIS — F411 Generalized anxiety disorder: Secondary | ICD-10-CM | POA: Diagnosis not present

## 2019-07-20 DIAGNOSIS — F322 Major depressive disorder, single episode, severe without psychotic features: Secondary | ICD-10-CM | POA: Diagnosis not present

## 2019-07-27 DIAGNOSIS — E559 Vitamin D deficiency, unspecified: Secondary | ICD-10-CM | POA: Diagnosis not present

## 2019-07-27 DIAGNOSIS — F332 Major depressive disorder, recurrent severe without psychotic features: Secondary | ICD-10-CM | POA: Diagnosis not present

## 2019-07-27 DIAGNOSIS — N3000 Acute cystitis without hematuria: Secondary | ICD-10-CM | POA: Diagnosis not present

## 2019-07-27 DIAGNOSIS — Z0000 Encounter for general adult medical examination without abnormal findings: Secondary | ICD-10-CM | POA: Diagnosis not present

## 2019-07-27 DIAGNOSIS — M25551 Pain in right hip: Secondary | ICD-10-CM | POA: Diagnosis not present

## 2019-08-13 ENCOUNTER — Ambulatory Visit: Payer: 59 | Admitting: Family Medicine

## 2019-08-13 DIAGNOSIS — Z0289 Encounter for other administrative examinations: Secondary | ICD-10-CM

## 2019-08-23 DIAGNOSIS — F1410 Cocaine abuse, uncomplicated: Secondary | ICD-10-CM

## 2019-08-23 DIAGNOSIS — F1510 Other stimulant abuse, uncomplicated: Secondary | ICD-10-CM | POA: Insufficient documentation

## 2019-08-23 DIAGNOSIS — Z03818 Encounter for observation for suspected exposure to other biological agents ruled out: Secondary | ICD-10-CM | POA: Diagnosis not present

## 2019-08-23 DIAGNOSIS — Z20822 Contact with and (suspected) exposure to covid-19: Secondary | ICD-10-CM | POA: Diagnosis not present

## 2019-08-23 HISTORY — DX: Other stimulant abuse, uncomplicated: F15.10

## 2019-08-23 HISTORY — DX: Cocaine abuse, uncomplicated: F14.10

## 2019-09-08 ENCOUNTER — Ambulatory Visit: Payer: BC Managed Care – PPO | Admitting: Family Medicine

## 2019-09-08 DIAGNOSIS — Z0289 Encounter for other administrative examinations: Secondary | ICD-10-CM

## 2019-09-10 ENCOUNTER — Telehealth: Payer: Self-pay

## 2019-09-10 MED ORDER — MIRTAZAPINE 30 MG PO TABS: 30.0000 mg | ORAL_TABLET | Freq: Every day | ORAL | 0 refills | Status: DC

## 2019-09-10 MED ORDER — VENLAFAXINE HCL ER 150 MG PO CP24: 150.0000 mg | ORAL_CAPSULE | Freq: Every day | ORAL | 0 refills | Status: DC

## 2019-09-10 NOTE — Telephone Encounter (Signed)
Pt was scheduled for appt next week and was advised that 1 weeks worth of medication would be sent to pharmacy to last until that appt.

## 2019-09-10 NOTE — Addendum Note (Signed)
Addended by: Daryel November L on: 09/10/2019 04:16 PM   Modules accepted: Orders

## 2019-09-10 NOTE — Telephone Encounter (Signed)
Patient had to leave rehab facility unexpectedly. She left behind medications effexor & remeron. Can she get a refill?

## 2019-09-10 NOTE — Telephone Encounter (Signed)
Pt does not have upcoming appt and was a no show for last appt on 08/13/19. Rehab notes in pts chart. Please advise

## 2019-09-10 NOTE — Telephone Encounter (Signed)
Pt no showed her appt 2 days ago.  Her options are  to call the rehab facility and pick up her meds or make an appt for follow up with this provider in next available slot.    If pt makes an appt can call in 1 week refill of both script

## 2019-09-13 ENCOUNTER — Ambulatory Visit (INDEPENDENT_AMBULATORY_CARE_PROVIDER_SITE_OTHER): Payer: BC Managed Care – PPO | Admitting: Family Medicine

## 2019-09-13 ENCOUNTER — Other Ambulatory Visit: Payer: Self-pay

## 2019-09-13 DIAGNOSIS — Z3042 Encounter for surveillance of injectable contraceptive: Secondary | ICD-10-CM

## 2019-09-13 MED ORDER — MEDROXYPROGESTERONE ACETATE 150 MG/ML IM SUSP
150.0000 mg | Freq: Once | INTRAMUSCULAR | Status: AC
  Administered 2019-09-13: 150 mg via INTRAMUSCULAR

## 2019-09-13 NOTE — Progress Notes (Addendum)
Terri [name] is a 34 y.o. female presents to the office today for depo provera injections, per physician's orders. Original order: 06/24/19 Repeat shot every 12-13 weeks  Depo provera, 150mg ,  IM was administered left upper outter quad today. Patient tolerated injection. Patient due for follow up labs/provider appt: Yes. Date due: August 2021, appt made Yes Patient next injection due: 10/18-11/1, appt made No  02-08-2004, CMA  Electronically Signed by: Misty Stanley, DO [facility]- [place]

## 2019-09-15 ENCOUNTER — Encounter: Payer: Self-pay | Admitting: Family Medicine

## 2019-09-15 ENCOUNTER — Ambulatory Visit (INDEPENDENT_AMBULATORY_CARE_PROVIDER_SITE_OTHER): Payer: BC Managed Care – PPO | Admitting: Family Medicine

## 2019-09-15 ENCOUNTER — Other Ambulatory Visit: Payer: Self-pay

## 2019-09-15 VITALS — BP 106/69 | HR 76 | Temp 98.0°F | Resp 18 | Ht 65.0 in | Wt 123.0 lb

## 2019-09-15 DIAGNOSIS — F410 Panic disorder [episodic paroxysmal anxiety] without agoraphobia: Secondary | ICD-10-CM | POA: Diagnosis not present

## 2019-09-15 DIAGNOSIS — F411 Generalized anxiety disorder: Secondary | ICD-10-CM | POA: Diagnosis not present

## 2019-09-15 DIAGNOSIS — F418 Other specified anxiety disorders: Secondary | ICD-10-CM | POA: Diagnosis not present

## 2019-09-15 DIAGNOSIS — M7061 Trochanteric bursitis, right hip: Secondary | ICD-10-CM

## 2019-09-15 DIAGNOSIS — S73191S Other sprain of right hip, sequela: Secondary | ICD-10-CM

## 2019-09-15 DIAGNOSIS — G479 Sleep disorder, unspecified: Secondary | ICD-10-CM

## 2019-09-15 DIAGNOSIS — R0989 Other specified symptoms and signs involving the circulatory and respiratory systems: Secondary | ICD-10-CM

## 2019-09-15 MED ORDER — ZOLPIDEM TARTRATE ER 6.25 MG PO TBCR: 6.2500 mg | EXTENDED_RELEASE_TABLET | Freq: Every evening | ORAL | 5 refills | Status: DC | PRN

## 2019-09-15 MED ORDER — MIRTAZAPINE 30 MG PO TABS: 30.0000 mg | ORAL_TABLET | Freq: Every day | ORAL | 5 refills | Status: DC

## 2019-09-15 MED ORDER — VENLAFAXINE HCL ER 150 MG PO CP24: 150.0000 mg | ORAL_CAPSULE | Freq: Every day | ORAL | 5 refills | Status: DC

## 2019-09-15 NOTE — Patient Instructions (Signed)
I have refilled your remeron, effexor and ambien.  Have cxr completed at [facility].  I have referred you back to your pain specialist. Call them to make appt.   Next appt here 5.5 mos. Sooner if needed.
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
## 2019-09-15 NOTE — Telephone Encounter (Signed)
Patient had f/u appt today. Please advise, thanks.

## 2019-09-16 ENCOUNTER — Ambulatory Visit (HOSPITAL_BASED_OUTPATIENT_CLINIC_OR_DEPARTMENT_OTHER)
Admission: RE | Admit: 2019-09-16 | Discharge: 2019-09-16 | Disposition: A | Discharge status: Home / Self Care | Payer: BC Managed Care – PPO | Source: Ambulatory Visit | Attending: Family Medicine | Admitting: Family Medicine

## 2019-09-16 DIAGNOSIS — S299XXA Unspecified injury of thorax, initial encounter: Secondary | ICD-10-CM | POA: Diagnosis not present

## 2019-09-16 DIAGNOSIS — R0989 Other specified symptoms and signs involving the circulatory and respiratory systems: Secondary | ICD-10-CM | POA: Diagnosis not present

## 2019-09-17 ENCOUNTER — Other Ambulatory Visit: Payer: Self-pay

## 2019-09-17 NOTE — Telephone Encounter (Signed)
Patient left voicemail requesting new rx for Hydrocodone. Confirmed with pharmacy that last rx picked up on 08/18/19 #90 w/ 0 rf. Her last appt was 09/15/19, referral was done for pain mgmt specialist same day as well.  Please advise if appropriate, thanks.

## 2019-09-23 MED ORDER — DICLOFENAC SODIUM 75 MG PO TBEC: 75.0000 mg | DELAYED_RELEASE_TABLET | Freq: Two times a day (BID) | ORAL | 2 refills | Status: DC

## 2019-09-23 MED ORDER — CLONIDINE HCL 0.1 MG PO TABS: ORAL_TABLET | ORAL | 0 refills | Status: DC

## 2019-09-23 NOTE — Telephone Encounter (Signed)
Diclofenac twice daily prescribed to help with pain. Clonidine tapered over 1 month to help with any potential withdrawal symptoms. Patient will take 2 weeks of 3 times daily, then 1 week of twice daily and then 1 week of nightly dosing.  Patient was sent in a chart message discussing using half tabs of her hydrocodone daily until she runs out (if she has any left).  Hopefully she will be hearing back from her pain specialist on appointment date.

## 2019-09-23 NOTE — Telephone Encounter (Signed)
LM requesting call back.  

## 2019-09-24 DIAGNOSIS — M533 Sacrococcygeal disorders, not elsewhere classified: Secondary | ICD-10-CM | POA: Diagnosis not present

## 2019-09-24 DIAGNOSIS — M549 Dorsalgia, unspecified: Secondary | ICD-10-CM | POA: Diagnosis not present

## 2019-09-24 DIAGNOSIS — M25559 Pain in unspecified hip: Secondary | ICD-10-CM | POA: Diagnosis not present

## 2019-09-24 DIAGNOSIS — Z79899 Other long term (current) drug therapy: Secondary | ICD-10-CM | POA: Diagnosis not present

## 2019-09-24 DIAGNOSIS — Z79891 Long term (current) use of opiate analgesic: Secondary | ICD-10-CM | POA: Diagnosis not present

## 2019-09-24 DIAGNOSIS — G894 Chronic pain syndrome: Secondary | ICD-10-CM | POA: Diagnosis not present

## 2019-09-24 NOTE — Telephone Encounter (Signed)
Patient advised of provider recommendations and new RX with instructions, patient verbalized understanding and had no questions at this time.

## 2019-10-11 ENCOUNTER — Other Ambulatory Visit: Payer: Self-pay

## 2019-10-11 ENCOUNTER — Encounter: Payer: Self-pay | Admitting: Family Medicine

## 2019-10-11 NOTE — Telephone Encounter (Signed)
Rx request will not be sent due to recent fill.

## 2019-10-14 DIAGNOSIS — Z79899 Other long term (current) drug therapy: Secondary | ICD-10-CM | POA: Diagnosis not present

## 2019-10-14 DIAGNOSIS — Z79891 Long term (current) use of opiate analgesic: Secondary | ICD-10-CM | POA: Diagnosis not present

## 2019-10-22 DIAGNOSIS — Z03818 Encounter for observation for suspected exposure to other biological agents ruled out: Secondary | ICD-10-CM | POA: Diagnosis not present

## 2019-10-22 DIAGNOSIS — Z1152 Encounter for screening for COVID-19: Secondary | ICD-10-CM | POA: Diagnosis not present

## 2019-10-22 DIAGNOSIS — J019 Acute sinusitis, unspecified: Secondary | ICD-10-CM | POA: Diagnosis not present

## 2019-10-25 ENCOUNTER — Encounter: Payer: Self-pay | Admitting: Family Medicine

## 2019-11-04 DIAGNOSIS — Z79899 Other long term (current) drug therapy: Secondary | ICD-10-CM | POA: Diagnosis not present

## 2019-11-04 DIAGNOSIS — F1120 Opioid dependence, uncomplicated: Secondary | ICD-10-CM | POA: Diagnosis not present

## 2019-11-04 DIAGNOSIS — Z79891 Long term (current) use of opiate analgesic: Secondary | ICD-10-CM | POA: Diagnosis not present

## 2019-11-12 DIAGNOSIS — Z20822 Contact with and (suspected) exposure to covid-19: Secondary | ICD-10-CM | POA: Diagnosis not present

## 2019-11-28 DIAGNOSIS — J020 Streptococcal pharyngitis: Secondary | ICD-10-CM | POA: Diagnosis not present

## 2019-11-28 DIAGNOSIS — Z03818 Encounter for observation for suspected exposure to other biological agents ruled out: Secondary | ICD-10-CM | POA: Diagnosis not present

## 2019-12-02 DIAGNOSIS — Z79891 Long term (current) use of opiate analgesic: Secondary | ICD-10-CM | POA: Diagnosis not present

## 2019-12-02 DIAGNOSIS — Z79899 Other long term (current) drug therapy: Secondary | ICD-10-CM | POA: Diagnosis not present

## 2019-12-02 DIAGNOSIS — F1120 Opioid dependence, uncomplicated: Secondary | ICD-10-CM | POA: Diagnosis not present

## 2019-12-09 DIAGNOSIS — F1120 Opioid dependence, uncomplicated: Secondary | ICD-10-CM | POA: Diagnosis not present

## 2019-12-09 DIAGNOSIS — Z79891 Long term (current) use of opiate analgesic: Secondary | ICD-10-CM | POA: Diagnosis not present

## 2019-12-09 DIAGNOSIS — Z79899 Other long term (current) drug therapy: Secondary | ICD-10-CM | POA: Diagnosis not present

## 2019-12-24 ENCOUNTER — Ambulatory Visit: Payer: BC Managed Care – PPO | Admitting: Family Medicine

## 2019-12-24 ENCOUNTER — Ambulatory Visit (INDEPENDENT_AMBULATORY_CARE_PROVIDER_SITE_OTHER): Payer: BC Managed Care – PPO

## 2019-12-24 ENCOUNTER — Other Ambulatory Visit: Payer: Self-pay

## 2019-12-24 DIAGNOSIS — Z3042 Encounter for surveillance of injectable contraceptive: Secondary | ICD-10-CM | POA: Diagnosis not present

## 2019-12-24 DIAGNOSIS — Z789 Other specified health status: Secondary | ICD-10-CM

## 2019-12-24 MED ORDER — MEDROXYPROGESTERONE ACETATE 150 MG/ML IM SUSP
150.0000 mg | Freq: Once | INTRAMUSCULAR | Status: AC
  Administered 2019-12-24: 150 mg via INTRAMUSCULAR

## 2019-12-24 NOTE — Progress Notes (Signed)
Terri [name] is a 34 y.o. female presents to the office today for depo provera injections, per physician's orders. Original order: 06/24/19 Repeat shot every 12-13 weeks Depo provera, 150mg ,  IM was administered left upper outter quad today. Patient tolerated injection. Patient due for follow up labs/provider appt: Yes. Date due: February 2022, appt made Yes

## 2020-02-12 DIAGNOSIS — Z20822 Contact with and (suspected) exposure to covid-19: Secondary | ICD-10-CM | POA: Diagnosis not present

## 2020-02-12 DIAGNOSIS — J069 Acute upper respiratory infection, unspecified: Secondary | ICD-10-CM | POA: Diagnosis not present

## 2020-02-21 DIAGNOSIS — N390 Urinary tract infection, site not specified: Secondary | ICD-10-CM | POA: Diagnosis not present

## 2020-02-24 ENCOUNTER — Other Ambulatory Visit: Payer: Self-pay

## 2020-02-28 ENCOUNTER — Ambulatory Visit: Payer: BC Managed Care – PPO | Admitting: Family Medicine

## 2020-03-01 ENCOUNTER — Ambulatory Visit: Payer: BC Managed Care – PPO | Admitting: Family Medicine

## 2020-03-19 DIAGNOSIS — F332 Major depressive disorder, recurrent severe without psychotic features: Secondary | ICD-10-CM | POA: Diagnosis not present

## 2020-03-24 ENCOUNTER — Ambulatory Visit: Payer: BC Managed Care – PPO

## 2020-04-06 DIAGNOSIS — F332 Major depressive disorder, recurrent severe without psychotic features: Secondary | ICD-10-CM | POA: Diagnosis not present

## 2020-04-13 DIAGNOSIS — F332 Major depressive disorder, recurrent severe without psychotic features: Secondary | ICD-10-CM | POA: Diagnosis not present

## 2020-05-16 DIAGNOSIS — Z202 Contact with and (suspected) exposure to infections with a predominantly sexual mode of transmission: Secondary | ICD-10-CM | POA: Diagnosis not present

## 2020-06-05 DIAGNOSIS — F332 Major depressive disorder, recurrent severe without psychotic features: Secondary | ICD-10-CM | POA: Diagnosis not present

## 2020-06-20 DIAGNOSIS — F411 Generalized anxiety disorder: Secondary | ICD-10-CM | POA: Diagnosis not present

## 2020-06-29 DIAGNOSIS — F411 Generalized anxiety disorder: Secondary | ICD-10-CM | POA: Diagnosis not present

## 2020-07-04 DIAGNOSIS — F411 Generalized anxiety disorder: Secondary | ICD-10-CM | POA: Diagnosis not present

## 2020-09-20 DIAGNOSIS — U071 COVID-19: Secondary | ICD-10-CM | POA: Diagnosis not present

## 2020-09-29 DIAGNOSIS — Z03818 Encounter for observation for suspected exposure to other biological agents ruled out: Secondary | ICD-10-CM | POA: Diagnosis not present

## 2020-10-04 DIAGNOSIS — J020 Streptococcal pharyngitis: Secondary | ICD-10-CM | POA: Diagnosis not present

## 2020-12-01 DIAGNOSIS — R079 Chest pain, unspecified: Secondary | ICD-10-CM | POA: Diagnosis not present

## 2020-12-01 DIAGNOSIS — R072 Precordial pain: Secondary | ICD-10-CM | POA: Diagnosis not present

## 2020-12-01 DIAGNOSIS — R0789 Other chest pain: Secondary | ICD-10-CM | POA: Diagnosis not present

## 2020-12-01 DIAGNOSIS — R000 Tachycardia, unspecified: Secondary | ICD-10-CM | POA: Diagnosis not present

## 2021-03-13 ENCOUNTER — Other Ambulatory Visit: Payer: Self-pay

## 2021-03-13 ENCOUNTER — Emergency Department (HOSPITAL_BASED_OUTPATIENT_CLINIC_OR_DEPARTMENT_OTHER)
Admission: EM | Admit: 2021-03-13 | Discharge: 2021-03-14 | Disposition: A | Discharge status: Home / Self Care | Payer: BC Managed Care – PPO | Attending: Emergency Medicine | Admitting: Emergency Medicine

## 2021-03-13 ENCOUNTER — Emergency Department (HOSPITAL_BASED_OUTPATIENT_CLINIC_OR_DEPARTMENT_OTHER): Payer: BC Managed Care – PPO | Admitting: Radiology

## 2021-03-13 ENCOUNTER — Encounter (HOSPITAL_BASED_OUTPATIENT_CLINIC_OR_DEPARTMENT_OTHER): Payer: Self-pay | Admitting: Urology

## 2021-03-13 DIAGNOSIS — S299XXA Unspecified injury of thorax, initial encounter: Secondary | ICD-10-CM | POA: Diagnosis not present

## 2021-03-13 DIAGNOSIS — R0789 Other chest pain: Secondary | ICD-10-CM

## 2021-03-13 DIAGNOSIS — R072 Precordial pain: Secondary | ICD-10-CM | POA: Diagnosis not present

## 2021-03-13 DIAGNOSIS — R079 Chest pain, unspecified: Secondary | ICD-10-CM | POA: Diagnosis not present

## 2021-03-13 DIAGNOSIS — Z87891 Personal history of nicotine dependence: Secondary | ICD-10-CM | POA: Diagnosis not present

## 2021-03-13 NOTE — ED Triage Notes (Signed)
States "felt pop in chest" 3 days ago  Pain with movement and deep breathing Denies any specific injury  States any type of palpation painful  Tearful during triage

## 2021-03-14 ENCOUNTER — Emergency Department (HOSPITAL_BASED_OUTPATIENT_CLINIC_OR_DEPARTMENT_OTHER): Payer: BC Managed Care – PPO

## 2021-03-14 DIAGNOSIS — R079 Chest pain, unspecified: Secondary | ICD-10-CM | POA: Diagnosis not present

## 2021-03-14 MED ORDER — OXYCODONE-ACETAMINOPHEN 10-325 MG PO TABS: 1.0000 | ORAL_TABLET | Freq: Four times a day (QID) | ORAL | 0 refills | Status: DC | PRN

## 2021-03-14 MED ORDER — ONDANSETRON HCL 4 MG/2ML IJ SOLN
4.0000 mg | Freq: Once | INTRAMUSCULAR | Status: AC
  Administered 2021-03-14: 4 mg via INTRAVENOUS
  Filled 2021-03-14: qty 2

## 2021-03-14 MED ORDER — FENTANYL CITRATE PF 50 MCG/ML IJ SOSY
100.0000 ug | PREFILLED_SYRINGE | Freq: Once | INTRAMUSCULAR | Status: AC
  Administered 2021-03-14: 100 ug via INTRAVENOUS
  Filled 2021-03-14: qty 2

## 2021-03-14 MED ORDER — NAPROXEN 375 MG PO TABS: ORAL_TABLET | ORAL | 0 refills | Status: DC

## 2021-03-14 MED ORDER — KETOROLAC TROMETHAMINE 15 MG/ML IJ SOLN
15.0000 mg | Freq: Once | INTRAMUSCULAR | Status: AC
  Administered 2021-03-14: 15 mg via INTRAVENOUS
  Filled 2021-03-14: qty 1

## 2021-03-14 NOTE — ED Provider Notes (Signed)
[facility] Provider Note: [name], MD, FACEP  CSN: [id number] MRN: [id number] ARRIVAL: 03/13/21 at 1844 ROOM: DB001/DB001   CHIEF COMPLAINT  Chest Injury   HISTORY OF PRESENT ILLNESS  03/14/21 12:17 AM Terri [name] is a 36 y.o. female who felt a "pop" in her chest 3 days ago as she was getting up from a sofa.  She is now having severe, sharp, well localized pain along her left sternal border.  The pain is worse with movement or breathing and it is making her short of breath.  There is no lump or mass at the site.   Past Medical History:  Diagnosis Date   Allergic rhinitis 12/01/2014   Allergy    Brain injury 36 yrs old   fell off babysitters cabinet, and on a separate occasion she fell down a flight of stairs   Chronic pain disorder    Chronic right hip pain    Closed head injury About age 45 yrs   ICH per pt, with seizure disorder for appox 2 yrs afterward.  No significant residual disability.   Cocaine abuse (HCC) 08/23/2019   Depression with anxiety    Failed buspar.  Did well on citalopram.   Drug overdose 2004   Xanax   H/O varicella    History of chlamydia    HSV infection    Genital clx pos HSV 2--2007.   Hx of migraines    chronic migraines (Dr. Everlena Cooper) 03/2015   Hx: UTI (urinary tract infection) 09/06/10   IBS (irritable bowel syndrome)    Peri-trochanteric right hip tendinitis    Right hip pain 05/2009   MRI 10/12/09: Femoral head and neck stress injury plus small acetabular labral tear.  [facility] ortho (Dr. Caswell Corwin) as of 11/2012.  Preferred pain mgmt: steroid injection into right subtrochanteric bursa 12/06/15.   Seizure (HCC)    for 2 yrs after closed head injury/ICH   Stimulant abuse (HCC) 08/23/2019   Tear of right acetabular labrum    Trochanteric bursitis of right hip     Past Surgical History:  Procedure Laterality Date   HIP SURGERY  10/10/2010   Right hip GT bursectomy and osteophytectomy for intractable right greater troch pain (Dr.  Despina Hick).  [facility] second opinion 06/2012--resulted in repeat MRI, then right hip IA injection that gave mild short term response, then was referred to PT with iontophoresis (as of 09/07/12)--was told to f/u in 6 wks.   MOLE REMOVAL     on back   WISDOM TOOTH EXTRACTION      Family History  Problem Relation Age of Onset   Breast cancer Mother 55   Thyroid cancer Mother    Hepatitis Father        Hep C   Alcohol abuse Father    Thyroid disease Sister    Anxiety disorder Sister    Thyroid cancer Sister    Thrombophlebitis Maternal Grandfather    Anxiety disorder Maternal Grandfather    Heart disease Maternal Grandfather    Alzheimer's disease Paternal Grandmother     Social History   Tobacco Use   Smoking status: Former    Packs/day: 0.30    Types: Cigarettes    Quit date: 02/12/2007    Years since quitting: 14.0   Smokeless tobacco: Never  Vaping Use   Vaping Use: Never used  Substance Use Topics   Alcohol use: Yes    Comment: occasional   Drug use: Not Currently    Comment: last use  1 year ago    Prior to Admission medications   Medication Sig Start Date End Date Taking? Authorizing Provider  naproxen (NAPROSYN) 375 MG tablet Take 1 tablet twice daily as needed for chest pain. 03/14/21  Yes Stacia Feazell, MD  oxyCODONE-acetaminophen (PERCOCET) 10-325 MG tablet Take 1 tablet by mouth every 6 (six) hours as needed (For severe chest pain). 03/14/21  Yes Reda Gettis, MD  cloNIDine (CATAPRES) 0.1 MG tablet TID for 2 weeks, then BID for 1 week, then QHS for 1 week. 09/23/19   Kuneff, Renee A, DO  fluticasone (FLONASE) 50 MCG/ACT nasal spray Place 2 sprays into both nostrils daily. 12/08/18   Daphine Deutscher, Mary-Margaret, FNP  mirtazapine (REMERON) 30 MG tablet Take 1 tablet (30 mg total) by mouth at bedtime. 09/15/19   Kuneff, Renee A, DO  valACYclovir (VALTREX) 1000 MG tablet Take 1 tablet (1,000 mg total) by mouth daily. 12/07/18   Kuneff, Renee A, DO  venlafaxine XR (EFFEXOR XR) 150 MG 24 hr  capsule Take 1 capsule (150 mg total) by mouth daily with breakfast. 09/15/19   Kuneff, Renee A, DO  vitamin B-12 (CYANOCOBALAMIN) 100 MCG tablet Take 1 tablet (100 mcg total) by mouth daily. 01/25/15   Kuneff, Renee A, DO  zolpidem (AMBIEN CR) 6.25 MG CR tablet Take 1 tablet (6.25 mg total) by mouth at bedtime as needed for sleep. 09/15/19   Kuneff, Renee A, DO    Allergies Penicillins   REVIEW OF SYSTEMS  Negative except as noted here or in the History of Present Illness.   PHYSICAL EXAMINATION  Initial Vital Signs Blood pressure 117/74, pulse 88, temperature 97.7 F (36.5 C), resp. rate 15, height 5\' 5"  (1.651 m), weight 49.9 kg, last menstrual period 03/02/2021, SpO2 100 %.  Examination General: Well-developed, well-nourished female in no acute distress; appearance consistent with age of record HENT: normocephalic; atraumatic Eyes: Normal appearance Neck: supple Heart: regular rate and rhythm Lungs: clear to auscultation bilaterally Chest: Severe left side sternocostal point tenderness without mass or deformity Abdomen: soft; nondistended; nontender; bowel sounds present Extremities: No deformity; full range of motion; pulses normal Neurologic: Awake, alert and oriented; motor function intact in all extremities and symmetric; no facial droop Skin: Warm and dry Psychiatric: Tearful; grimacing   RESULTS  Summary of this visit's results, reviewed and interpreted by myself:   EKG Interpretation  Date/Time:  Tuesday March 13 2021 19:04:43 EST Ventricular Rate:  103 PR Interval:  120 QRS Duration: 76 QT Interval:  330 QTC Calculation: 432 R Axis:   88 Text Interpretation: Sinus tachycardia Otherwise normal ECG When compared with ECG of 24-May-2017 05:46, PREVIOUS ECG IS PRESENT when compared to prior, similar appearance. no STEMI Confirmed by 26-May-2017 (Theda Belfast) on 03/13/2021 7:42:14 PM       Laboratory Studies: No results found for this or any previous visit (from  the past 24 hour(s)). Imaging Studies: DG Chest 2 View  Result Date: 03/13/2021 CLINICAL DATA:  Chest injury. Injury 3 days ago pain with movement and inspiration. EXAM: CHEST - 2 VIEW COMPARISON:  December 01, 2020. FINDINGS: The heart size and mediastinal contours are within normal limits. Both lungs are clear. The visualized skeletal structures are unremarkable. IMPRESSION: No active cardiopulmonary disease. Electronically Signed   By: December 03, 2020 M.D.   On: 03/13/2021 19:16   CT Chest Wo Contrast  Result Date: 03/14/2021 CLINICAL DATA:  Left chest pain EXAM: CT CHEST WITHOUT CONTRAST TECHNIQUE: Multidetector CT imaging of the chest was performed following the  standard protocol without IV contrast. RADIATION DOSE REDUCTION: This exam was performed according to the departmental dose-optimization program which includes automated exposure control, adjustment of the mA and/or kV according to patient size and/or use of iterative reconstruction technique. COMPARISON:  Chest radiograph dated 03/13/2021 FINDINGS: Cardiovascular: Heart is normal in size.  No pericardial effusion. No evidence of thoracic aneurysm. Mediastinum/Nodes: No suspicious mediastinal lymphadenopathy. Visualized thyroid is unremarkable. Lungs/Pleura: Azygous fissure. Lungs are clear. No suspicious pulmonary nodules. No pleural effusion or pneumothorax. Upper Abdomen: Visualized upper abdomen is grossly unremarkable. Musculoskeletal: No fracture is seen. Specifically, the ribs are intact. IMPRESSION: Negative CT chest. No rib fracture is seen. Electronically Signed   By: Charline BillsSriyesh  Krishnan M.D.   On: 03/14/2021 00:52    ED COURSE and MDM  Nursing notes, initial and subsequent vitals signs, including pulse oximetry, reviewed and interpreted by myself.  Vitals:   03/14/21 0045 03/14/21 0100 03/14/21 0115 03/14/21 0134  BP:    119/75  Pulse: 81 83 70 84  Resp:    19  Temp:      SpO2: 100% 98% 95% 98%  Weight:      Height:        Medications  ketorolac (TORADOL) 15 MG/ML injection 15 mg (15 mg Intravenous Given 03/14/21 0055)  ondansetron (ZOFRAN) injection 4 mg (4 mg Intravenous Given 03/14/21 0055)  fentaNYL (SUBLIMAZE) injection 100 mcg (100 mcg Intravenous Given 03/14/21 0055)   1:46 AM Suspect costochondral fracture along the sternal border given unremarkable CT findings.  Patient's pain significantly improved at this time.   PROCEDURES  Procedures   ED DIAGNOSES     ICD-10-CM   1. Sternocostal pain  R07.89          Paula LibraMolpus, Cynthis Purington, MD 03/14/21 [id number]

## 2021-03-14 NOTE — ED Notes (Signed)
Patient transported to CT 

## 2021-03-27 ENCOUNTER — Telehealth: Payer: Self-pay

## 2021-03-27 NOTE — Telephone Encounter (Signed)
Received fax from [employer] Requesting Dr.Kuneff to complete and sign disability/leave form  Attached Charge Form  - put in [place] front office area

## 2021-03-28 ENCOUNTER — Ambulatory Visit (INDEPENDENT_AMBULATORY_CARE_PROVIDER_SITE_OTHER): Payer: BC Managed Care – PPO | Admitting: Family Medicine

## 2021-03-28 ENCOUNTER — Other Ambulatory Visit: Payer: Self-pay

## 2021-03-28 ENCOUNTER — Encounter: Payer: Self-pay | Admitting: Family Medicine

## 2021-03-28 VITALS — BP 95/66 | HR 113 | Temp 98.3°F | Ht 65.0 in | Wt 115.0 lb

## 2021-03-28 DIAGNOSIS — M9908 Segmental and somatic dysfunction of rib cage: Secondary | ICD-10-CM

## 2021-03-28 DIAGNOSIS — R0781 Pleurodynia: Secondary | ICD-10-CM | POA: Diagnosis not present

## 2021-03-28 DIAGNOSIS — M940 Chondrocostal junction syndrome [Tietze]: Secondary | ICD-10-CM | POA: Diagnosis not present

## 2021-03-28 NOTE — Progress Notes (Signed)
This visit occurred during the SARS-CoV-2 public health emergency.  Safety protocols were in place, including screening questions prior to the visit, additional usage of staff PPE, and extensive cleaning of exam room while observing appropriate contact time as indicated for disinfecting solutions.    Terri [name] , Aug 04, 1985, 36 y.o., female MRN: [id number] Patient Care Team    Relationship Specialty Notifications Start End  Natalia Leatherwood, DO PCP - General Family Medicine  05/16/15   Ardell Isaacs, MD Consulting Physician Pain Medicine  03/17/13   Drema Dallas, DO Consulting Physician Neurology  03/24/15     Chief Complaint  Patient presents with   Chest Pain    Pt presents for FMLA forms for CP; ED visit printed; pt was not take out of work at time of ED visit; pt was out of work 01/30-02/06/23     Subjective: Pt presents for an OV with complaints of chest discomfort.  She was seen in the ED 03/14/2021 after experiencing chest discomfort.  Patient reports pressure was placed over her sternum, just very mild pressure and she heard a pop on the left side of her sternum.  She states she figured it would feel sore but improve within a few days.  Event occurred on 03/11/2021.  She had to call out of work the following day because she could not lift or raise her arm she was so sore.  She eventually went to the ED on 01/11/2022 since she was not feeling any improvement.  Exam per ED physician was severe left-sided sternal costal point tenderness without mass or deformity.  Chest x-ray was completed which showed no acute cardiopulmonary disease.  CT chest without contrast was also completed secondary to severe discomfort and no rib fracture was seen.  Per ED physician a costochondral fracture along the sternal border was suspected since the CT was normal.  It was provided with pain medication from the ED physician and reports it has slowly improved her discomfort.  She is still sore in this  area.  Depression screen [facility] 2/9 03/28/2021 04/21/2019 02/23/2019 03/13/2018 08/29/2017  Decreased Interest 0 3 3 3 1   Down, Depressed, Hopeless 0 3 3 3 3   PHQ - 2 Score 0 6 6 6 4   Altered sleeping - 0 3 3 3   Tired, decreased energy - 3 3 3 3   Change in appetite - 3 1 0 1  Feeling bad or failure about yourself  - 3 3 3 2   Trouble concentrating - 3 1 2 1   Moving slowly or fidgety/restless - 0 0 1 0  Suicidal thoughts - 1 0 0 0  PHQ-9 Score - 19 17 18 14   Difficult doing work/chores - Very difficult Very difficult Somewhat difficult Somewhat difficult    Allergies  Allergen Reactions   Penicillins Hives and Rash    Has patient had a PCN reaction causing immediate rash, facial/tongue/throat swelling, SOB or lightheadedness with hypotension: Yes Has patient had a PCN reaction causing severe rash involving mucus membranes or skin necrosis: No Has patient had a PCN reaction that required hospitalization: No Has patient had a PCN reaction occurring within the last 10 years: No If all of the above answers are "NO", then may proceed with Cephalosporin use.;    Social History   Social History Narrative   Single.     Lives in [place] with her mom and dad and  son [name].   Works as a Mining engineer with  mental/developmental disabilities.   Describes somewhat wild teenage years, "made some bad choices and hung out with the wrong people".  However, after that she had no issues with drugs or alcohol.   Reports that she had a second child a couple of years after her son [name] (2008) and she gave him/her up for adoption right after birth.                 Past Medical History:  Diagnosis Date   Allergic rhinitis 12/01/2014   Allergy    Brain injury 36 yrs old   fell off babysitters cabinet, and on a separate occasion she fell down a flight of stairs   Chronic pain disorder    Chronic right hip pain    Closed head injury About age 66 yrs   ICH per pt, with seizure disorder for  appox 2 yrs afterward.  No significant residual disability.   Cocaine abuse (HCC) 08/23/2019   Depression with anxiety    Failed buspar.  Did well on citalopram.   Drug overdose 2004   Xanax   Generalized anxiety disorder 07/05/2010   H/O varicella    History of chlamydia    HSV infection    Genital clx pos HSV 2--2007.   Hx of migraines    chronic migraines (Dr. Everlena CooperJaffe) 03/2015   Hx: UTI (urinary tract infection) 09/06/10   IBS (irritable bowel syndrome)    Panic disorder 07/05/2010   Peri-trochanteric right hip tendinitis    Right hip pain 05/2009   MRI 10/12/09: Femoral head and neck stress injury plus small acetabular labral tear.  [facility] ortho (Dr. Caswell CorwinStubbs) as of 11/2012.  Preferred pain mgmt: steroid injection into right subtrochanteric bursa 12/06/15.   Seizure (HCC)    for 2 yrs after closed head injury/ICH   Sleep disorder 09/18/2016   Stimulant abuse (HCC) 08/23/2019   Tear of right acetabular labrum    Trigger point of right side of body 05/09/2014   Trochanteric bursitis of right hip    Trochanteric bursitis, right hip 11/30/2018   Past Surgical History:  Procedure Laterality Date   HIP SURGERY  10/10/2010   Right hip GT bursectomy and osteophytectomy for intractable right greater troch pain (Dr. Despina HickAlusio).  [facility] second opinion 06/2012--resulted in repeat MRI, then right hip IA injection that gave mild short term response, then was referred to PT with iontophoresis (as of 09/07/12)--was told to f/u in 6 wks.   MOLE REMOVAL     on back   WISDOM TOOTH EXTRACTION     Family History  Problem Relation Age of Onset   Breast cancer Mother [id number]   Thyroid cancer Mother    Hepatitis Father        Hep C   Alcohol abuse Father    Thyroid disease Sister    Anxiety disorder Sister    Thyroid cancer Sister    Thrombophlebitis Maternal Grandfather    Anxiety disorder Maternal Grandfather    Heart disease Maternal Grandfather    Alzheimer's disease Paternal Grandmother    Allergies as of  03/28/2021       Reactions   Penicillins Hives, Rash   Has patient had a PCN reaction causing immediate rash, facial/tongue/throat swelling, SOB or lightheadedness with hypotension: Yes Has patient had a PCN reaction causing severe rash involving mucus membranes or skin necrosis: No Has patient had a PCN reaction that required hospitalization: No Has patient had a PCN reaction occurring within the last 10 years: No If  all of the above answers are "NO", then may proceed with Cephalosporin use.;        Medication List        Accurate as of March 28, 2021 11:59 PM. If you have any questions, ask your nurse or doctor.          STOP taking these medications    cloNIDine 0.1 MG tablet Commonly known as: CATAPRES Stopped by: Felix Pacini, DO   fluticasone 50 MCG/ACT nasal spray Commonly known as: FLONASE Stopped by: Felix Pacini, DO   mirtazapine 30 MG tablet Commonly known as: Remeron Stopped by: Felix Pacini, DO   oxyCODONE-acetaminophen 10-325 MG tablet Commonly known as: Percocet Stopped by: Felix Pacini, DO   valACYclovir 1000 MG tablet Commonly known as: VALTREX Stopped by: Felix Pacini, DO   venlafaxine XR 150 MG 24 hr capsule Commonly known as: Effexor XR Stopped by: Felix Pacini, DO   vitamin B-12 100 MCG tablet Commonly known as: CYANOCOBALAMIN Stopped by: Felix Pacini, DO   zolpidem 6.25 MG CR tablet Commonly known as: AMBIEN CR Stopped by: Felix Pacini, DO       TAKE these medications    naproxen 375 MG tablet Commonly known as: NAPROSYN Take 1 tablet twice daily as needed for chest pain.        All past medical history, surgical history, allergies, family history, immunizations andmedications were updated in the EMR today and reviewed under the history and medication portions of their EMR.     ROS Negative, with the exception of above mentioned in HPI   Objective:  BP 95/66    Pulse (!) 113    Temp 98.3 F (36.8 C) (Oral)    Ht 5'  5" (1.651 m)    Wt 115 lb (52.2 kg)    LMP 03/02/2021 (Exact Date)    SpO2 100%    BMI 19.14 kg/m  Body mass index is 19.14 kg/m. Physical Exam Vitals and nursing note reviewed.  Constitutional:      General: She is not in acute distress.    Appearance: Normal appearance. She is normal weight. She is not ill-appearing or toxic-appearing.  Eyes:     Extraocular Movements: Extraocular movements intact.     Conjunctiva/sclera: Conjunctivae normal.     Pupils: Pupils are equal, round, and reactive to light.  Musculoskeletal:       Arms:     Comments: Strongly tender to palpation over left sternal border. Left inhalation rib somatic dysfunction at multiple levels.   Skin:    General: Skin is warm and dry.     Findings: No ecchymosis, erythema or rash.  Neurological:     General: No focal deficit present.     Mental Status: She is alert and oriented to person, place, and time. Mental status is at baseline.  Psychiatric:        Mood and Affect: Mood normal.        Behavior: Behavior normal.        Thought Content: Thought content normal.        Judgment: Judgment normal.    No results found. No results found. No results found for this or any previous visit (from the past 24 hour(s)).  Assessment/Plan: Joseph L Carlin is a 36 y.o. 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  This is  the second occurrence within a month she has had a "pop" along the sternal border.  First occurred on the right a few weeks ago and now on the left.  Suspect slipping rib syndrome/prior subluxation as cause.  We discussed treatment options today including sports medicine and she is agreeable to this approach since she is still significantly tender. -Continue NSAIDs.  Avoid heavy lifting when able. - Ambulatory  referral to Sports Medicine FMLA papers completed for the time she reported off of 03/12/2021 - 03/19/2021 Reviewed expectations re: course of current medical issues. Discussed self-management of symptoms. Outlined signs and symptoms indicating need for more acute intervention. Patient verbalized understanding and all questions were answered. Patient received an After-Visit Summary.    Orders Placed This Encounter  Procedures   Ambulatory referral to Sports Medicine   No orders of the defined types were placed in this encounter.  Referral Orders         Ambulatory referral to Sports Medicine       Note is dictated utilizing voice recognition software. Although note has been proof read prior to signing, occasional typographical errors still can be missed. If any questions arise, please do not hesitate to call for verification.   electronically signed by:  Felix Pacini, DO  [facility] [place]

## 2021-03-28 NOTE — Telephone Encounter (Signed)
Pt hasn't been seen since 09/2019.  Pt sched for appt today at 1130 for 30 min slot.

## 2021-03-29 DIAGNOSIS — R0781 Pleurodynia: Secondary | ICD-10-CM | POA: Insufficient documentation

## 2021-03-29 DIAGNOSIS — M9908 Segmental and somatic dysfunction of rib cage: Secondary | ICD-10-CM | POA: Insufficient documentation

## 2021-03-29 DIAGNOSIS — R0789 Other chest pain: Secondary | ICD-10-CM | POA: Insufficient documentation

## 2021-03-29 DIAGNOSIS — M940 Chondrocostal junction syndrome [Tietze]: Secondary | ICD-10-CM | POA: Insufficient documentation

## 2021-03-29 NOTE — Telephone Encounter (Signed)
Completed FMLA forms for patient today.  Please fax forms to her employer ASAP and inform patient we have completed/faxed forms for her.

## 2021-03-30 NOTE — Telephone Encounter (Signed)
Pt informed

## 2021-03-30 NOTE — Telephone Encounter (Signed)
Form faxed

## 2021-04-02 ENCOUNTER — Ambulatory Visit: Payer: BC Managed Care – PPO | Admitting: Family Medicine

## 2021-05-01 ENCOUNTER — Telehealth: Payer: Self-pay | Admitting: Family Medicine

## 2021-05-02 ENCOUNTER — Ambulatory Visit: Payer: BC Managed Care – PPO | Admitting: Family Medicine

## 2021-05-04 ENCOUNTER — Ambulatory Visit: Payer: BC Managed Care – PPO | Admitting: Family Medicine

## 2021-05-08 ENCOUNTER — Ambulatory Visit: Payer: BC Managed Care – PPO | Admitting: Family Medicine

## 2021-05-15 ENCOUNTER — Ambulatory Visit: Payer: BC Managed Care – PPO | Admitting: Family Medicine

## 2021-05-15 ENCOUNTER — Encounter: Payer: Self-pay | Admitting: Family Medicine

## 2021-05-15 VITALS — BP 98/66 | HR 91 | Temp 98.2°F | Ht 65.0 in | Wt 110.0 lb

## 2021-05-15 DIAGNOSIS — F419 Anxiety disorder, unspecified: Secondary | ICD-10-CM | POA: Diagnosis not present

## 2021-05-15 DIAGNOSIS — F330 Major depressive disorder, recurrent, mild: Secondary | ICD-10-CM | POA: Diagnosis not present

## 2021-05-15 DIAGNOSIS — G479 Sleep disorder, unspecified: Secondary | ICD-10-CM | POA: Diagnosis not present

## 2021-05-15 MED ORDER — MIRTAZAPINE 30 MG PO TABS: 30.0000 mg | ORAL_TABLET | Freq: Every day | ORAL | 2 refills | Status: DC

## 2021-05-15 MED ORDER — VENLAFAXINE HCL ER 75 MG PO CP24: 75.0000 mg | ORAL_CAPSULE | Freq: Every day | ORAL | 2 refills | Status: DC

## 2021-05-15 MED ORDER — VENLAFAXINE HCL ER 37.5 MG PO CP24: 37.5000 mg | ORAL_CAPSULE | Freq: Every day | ORAL | 0 refills | Status: DC

## 2021-05-15 NOTE — Patient Instructions (Signed)
Managing Loss, Adult ?People experience loss in many different ways throughout their lives. Events such as moving, changing jobs, and losing friends can create a sense of loss. The loss may be as serious as a major health change, divorce, death of a pet, or death of a loved one. All of these types of loss are likely to create a physical and emotional reaction known as grief. Grief is the result of a major change or an absence of something or someone that you count on. Grief is a normal reaction to loss. ?A variety of factors can affect your grieving experience, including: ?The nature of your loss. ?Your relationship to what or whom you lost. ?Your understanding of grief and how to manage it. ?Your support system. ?Be aware that when grief becomes extreme, it can lead to more severe issues like isolation, depression, anxiety, or suicidal thoughts. Talk with your health care provider if you have any of these issues. ?How to manage lifestyle changes ?Keep to your normal routine as much as possible. ?If you have trouble focusing or doing normal activities, it is acceptable to take some time away from your normal routine. ?Spend time with friends and loved ones. ?Eat a healthy diet, get plenty of sleep, and rest when you feel tired. ?How to recognize changes  ?The way that you deal with your grief will affect your ability to function as you normally do. When grieving, you may experience these changes: ?Numbness, shock, sadness, anxiety, anger, denial, and guilt. ?Thoughts about death. ?Unexpected crying. ?A physical sensation of emptiness in your stomach. ?Problems sleeping and eating. ?Tiredness (fatigue). ?Loss of interest in normal activities. ?Dreaming about or imagining seeing the person who died. ?A need to remember what or whom you lost. ?Difficulty thinking about anything other than your loss for a period of time. ?Relief. If you have been expecting the loss for a while, you may feel a sense of relief when it  happens. ?Follow these instructions at home: ?Activity ?Express your feelings in healthy ways, such as: ?Talking with others about your loss. It may be helpful to find others who have had a similar loss, such as a support group. ?Writing down your feelings in a journal. ?Doing physical activities to release stress and emotional energy. ?Doing creative activities like painting, sculpting, or playing or listening to music. ?Practicing resilience. This is the ability to recover and adjust after facing challenges. Reading some resources that encourage resilience may help you to learn ways to practice those behaviors. ? ?General instructions ?Be patient with yourself and others. Allow the grieving process to happen, and remember that grieving takes time. ?It is likely that you may never feel completely done with some grief. You may find a way to move on while still cherishing memories and feelings about your loss. ?Accepting your loss is a process. It can take months or longer to adjust. ?Keep all follow-up visits. This is important. ?Where to find support ?To get support for managing loss: ?Ask your health care provider for help and recommendations, such as grief counseling or therapy. ?Think about joining a support group for people who are managing a loss. ?Where to find more information ?You can find more information about managing loss from: ?American Society of Clinical Oncology: www.cancer.net ?American Psychological Association: TVStereos.ch ?Contact a health care provider if: ?Your grief is extreme and keeps getting worse. ?You have ongoing grief that does not improve. ?Your body shows symptoms of grief, such as illness. ?You feel depressed, anxious, or  hopeless. ?Get help right away if: ?You have thoughts about hurting yourself or others. ?Get help right away if you feel like you may hurt yourself or others, or have thoughts about taking your own life. Go to your nearest emergency room or: ?Call 911. ?Call the  National Suicide Prevention Lifeline at 803-724-0379 or 988. This is open 24 hours a day. ?Text the Crisis Text Line at (863) 048-8506. ?Summary ?Grief is the result of a major change or an absence of someone or something that you count on. Grief is a normal reaction to loss. ?The depth of grief and the period of recovery depend on the type of loss and your ability to adjust to the change and process your feelings. ?Processing grief requires patience and a willingness to accept your feelings and talk about your loss with people who are supportive. ?It is important to find resources that work for you and to realize that people experience grief differently. There is not one grieving process that works for everyone in the same way. ?Be aware that when grief becomes extreme, it can lead to more severe issues like isolation, depression, anxiety, or suicidal thoughts. Talk with your health care provider if you have any of these issues. ?This information is not intended to replace advice given to you by your health care provider. Make sure you discuss any questions you have with your health care provider. ?Document Revised: 09/18/2020 Document Reviewed: 09/18/2020 ?Elsevier Patient Education ? 2022 Elsevier Inc. ? ? ?Major Depressive Disorder, Adult ?Major depressive disorder is a mental health condition. This disorder affects feelings. It can also affect the body. Symptoms of this condition last most of the day, almost every day, for 2 weeks. This disorder can affect: ?Relationships. ?Daily activities, such as work and school. ?Activities that you normally like to do. ?What are the causes? ?The cause of this condition is not known. The disorder is likely caused by a mix of things, including: ?Your personality, such as being a shy person. ?Your behavior, or how you act toward others. ?Your thoughts and feelings. ?Too much alcohol or drugs. ?How you react to stress. ?Health and mental problems that you have had for a long  time. ?Things that hurt you in the past (trauma). ?Big changes in your life, such as divorce. ?What increases the risk? ?The following factors may make you more likely to develop this condition: ?Having family members with depression. ?Being a woman. ?Problems in the family. ?Low levels of some brain chemicals. ?Things that caused you pain as a child, especially if you lost a parent or were abused. ?A lot of stress in your life, such as from: ?Living without basic needs of life, such as food and shelter. ?Being treated poorly because of race, sex, or religion (discrimination). ?Health and mental problems that you have had for a long time. ?What are the signs or symptoms? ?The main symptoms of this condition are: ?Being sad all the time. ?Being grouchy all the time. ?Loss of interest in things and activities. ?Other symptoms include: ?Sleeping too much or too little. ?Eating too much or too little. ?Gaining or losing weight, without knowing why. ?Feeling tired or having low energy. ?Being restless and weak. ?Feeling hopeless, worthless, or guilty. ?Trouble thinking clearly or making decisions. ?Thoughts of hurting yourself or others, or thoughts of ending your life. ?Spending a lot of time alone. ?Inability to complete common tasks of daily life. ?If you have very bad MDD, you may: ?Believe things that are not true. ?  Hear, see, taste, or feel things that are not there. ?Have mild depression that lasts for at least 2 years. ?Feel very sad and hopeless. ?Have trouble speaking or moving. ?How is this treated? ?This condition may be treated with: ?Talk therapy. This teaches you to know bad thoughts, feelings, and actions and how to change them. ?This can also help you to communicate with others. ?This can be done with members of your family. ?Medicines. These can be used to treat worry (anxiety), depression, or low levels of chemicals in the brain. ?Lifestyle changes. You may need to: ?Limit alcohol use. ?Limit drug  use. ?Get regular exercise. ?Get plenty of sleep. ?Make healthy eating choices. ?Spend more time outdoors. ?Brain stimulation. This treatment excites the brain. This is done when symptoms are very bad or have not got

## 2021-05-15 NOTE — Progress Notes (Signed)
Patient ID: Terri [name], female   DOB: 18-Jan-1986, 36 y.o.   MRN: [id number] ? ? ? ?Terri [name] , [birth year], 36 y.o., female ?MRN: [id number] ?Patient Care Team  ?  Relationship Specialty Notifications Start End  ?Natalia Leatherwood, DO PCP - General Family Medicine  05/16/15   ?Ardell Isaacs, MD Consulting Physician Pain Medicine  03/17/13   ?Drema Dallas, DO Consulting Physician Neurology  03/24/15   ? ? ?Chief Complaint  ?Patient presents with  ? Depression  ? ? ?Subjective:  ?Terri [name] is a 36 y.o. female presents today to discuss her depression. ?Generalized anxiety disorder/Depression with anxiety/Panic disorder ?Sleep disorder ?Patient presents today to discuss her recurrent depression and anxiety.  In the past she has had been tried on Effexor, Seroquel, Remeron and Celexa.  She has not required medications for about 2 years.  She reports her symptoms rebounded when she had a traumatic experience finding her friend dead on the bathroom floor.  She denies any SI or HI .  She would like to restart her medications that she had been on prior.  She has started therapy 2 weeks ago. ? ?Prior Notes: ?She reports the Seroquel 50 mg helped her sleep, but caused her to be too sleepy the next day. It helped with anxiety as well. She states she preferred her Remeron over the Seroquel. She has tapered up to the effexor 75 mg qd (prior dose 150). She states she does feel she needs to continue tapering to the 150 mg QD.  ?Overall much improved. She did use the [place] only after she discontinued the Seroquel.  ? ?Initial note 09/18/2016: ?Pt presents for an OV with Increased complaint of anxiety and depression. She states over the last 2 months something has changed, she is not certain what it is and she is feeling depressed. She has been on medications in the past, and has seen counseling when she was a teenager. She reports she always felt like she really didn't need medications and could handle things on her own. Last  noted she was on Celexa a few years ago, and she felt like she didn't really need it and she was doing well. She reports this has changed over the last few months, she found that she is being very hard on herself and critical. She used to love to this dinner parties and be social with her family and friends, and now she doesn't enjoy as much because she is critical about what she says what she thinks she is cooking etc. She started a new job a few months ago, but states that she loves it, her boss is great since she does not think this is the source of her issue. She is prescribed Klonopin twice a day when necessary for her long-term anxiety, she in the past has infrequently uses. She states she has been using it regularly as scheduled last few weeks. She also has continued to use her Ambien when needed. She states she knows she has some guilt surrounding the child she put up for adoption about 9 years ago. Her family has contact with the child (daughter), but patient does not. She has always felt she has dealt with that situation well, and did it was all right for the child, but admits today maybe she is dealing with it as well as she thought. ? ? ?  05/15/2021  ?  2:59 PM 03/28/2021  ? 11:45 AM 04/21/2019  ? 10:38 AM 02/23/2019  ?  8:07 AM 03/13/2018  ?  8:48 AM  ?Depression screen PHQ 2/9  ?Decreased Interest 3 0 3 3 3   ?Down, Depressed, Hopeless 3 0 3 3 3   ?PHQ - 2 Score 6 0 6 6 6   ?Altered sleeping 3  0 3 3  ?Tired, decreased energy 3  3 3 3   ?Change in appetite 3  3 1  0  ?Feeling bad or failure about yourself  3  3 3 3   ?Trouble concentrating 2  3 1 2   ?Moving slowly or fidgety/restless 1  0 0 1  ?Suicidal thoughts 1  1 0 0  ?PHQ-9 Score 22  19 17 18   ?Difficult doing work/chores   Very difficult Very difficult Somewhat difficult  ? ? ?  05/15/2021  ?  2:59 PM 04/21/2019  ? 10:37 AM 02/23/2019  ?  8:09 AM 03/13/2018  ?  8:50 AM  ?GAD 7 : Generalized Anxiety Score  ?Nervous, Anxious, on Edge 3 0 3 2  ?Control/stop  worrying 2 3 1 3   ?Worry too much - different things 3 3 3 3   ?Trouble relaxing 3 0 1 3  ?Restless 2 0 3 2  ?Easily annoyed or irritable 2 1 1 2   ?Afraid - awful might happen 3 0 0 0  ?Total GAD 7 Score 18 7 12 15   ?Anxiety Difficulty  Extremely difficult Very difficult Somewhat difficult  ? ? ?Allergies  ?Allergen Reactions  ? Penicillins Hives and Rash  ?  Has patient had a PCN reaction causing immediate rash, facial/tongue/throat swelling, SOB or lightheadedness with hypotension: Yes ?Has patient had a PCN reaction causing severe rash involving mucus membranes or skin necrosis: No ?Has patient had a PCN reaction that required hospitalization: No ?Has patient had a PCN reaction occurring within the last 10 years: No ?If all of the above answers are "NO", then may proceed with Cephalosporin use.; ?  ? ?Social History  ? ?Tobacco Use  ? Smoking status: Former  ?  Packs/day: 0.30  ?  Types: Cigarettes  ?  Quit date: 02/12/2007  ?  Years since quitting: 14.2  ? Smokeless tobacco: Never  ?Substance Use Topics  ? Alcohol use: Yes  ?  Comment: occasional  ? ?Past Medical History:  ?Diagnosis Date  ? Allergic rhinitis 12/01/2014  ? Allergy   ? Brain injury [facility](HCC) 36 yrs old  ? 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 Chronic pain disorder   ? Chronic right hip pain   ? Closed head injury About age 36 yrs  ? ICH per pt, with seizure disorder for appox 2 yrs afterward.  No significant residual disability.  ? Cocaine abuse (HCC) 08/23/2019  ? Depression with anxiety   ? Failed buspar.  Did well on citalopram.  ? Drug overdose 2004  ? Xanax  ? Generalized anxiety disorder 07/05/2010  ? H/O varicella   ? History of chlamydia   ? HSV infection   ? Genital clx pos HSV 2--2007.  ? Hx of migraines   ? chronic migraines (Dr. Everlena CooperJaffe) 03/2015  ? Hx: UTI (urinary tract infection) 09/06/10  ? IBS (irritable bowel syndrome)   ? Panic disorder 07/05/2010  ? Peri-trochanteric right hip tendinitis    ? Right hip pain 05/2009  ? MRI 10/12/09: Femoral head and neck stress injury plus small acetabular labral tear.  [facility] ortho (Dr. Caswell CorwinStubbs) as of 11/2012.  Preferred pain mgmt: steroid injection into right  subtrochanteric bursa 12/06/15.  ? Seizure (HCC)   ? for 2 yrs after closed head injury/[facility]  ? Sleep disorder 09/18/2016  ? Stimulant abuse (HCC) 08/23/2019  ? Tear of right acetabular labrum   ? Trigger point of right side of body 05/09/2014  ? Trochanteric bursitis of right hip   ? Trochanteric bursitis, right hip 11/30/2018  ? ?Past Surgical History:  ?Procedure Laterality Date  ? HIP SURGERY  10/10/2010  ? Right hip GT bursectomy and osteophytectomy for intractable right greater troch pain (Dr. Despina Hick).  [facility] second opinion 06/2012--resulted in repeat MRI, then right hip IA injection that gave mild short term response, then was referred to PT with iontophoresis (as of 09/07/12)--was told to f/u in 6 wks.  ? MOLE REMOVAL    ? on back  ? WISDOM TOOTH EXTRACTION    ? ?Family History  ?Problem Relation Age of Onset  ? Breast cancer Mother 49  ? Thyroid cancer Mother   ? Hepatitis Father   ?     Hep C  ? Alcohol abuse Father   ? Thyroid disease Sister   ? Anxiety disorder Sister   ? Thyroid cancer Sister   ? Thrombophlebitis Maternal Grandfather   ? Anxiety disorder Maternal Grandfather   ? Heart disease Maternal Grandfather   ? Alzheimer's disease Paternal Grandmother   ? ? ?No results found for this or any previous visit (from the past 24 hour(s)). ?No results found. ? ? ?ROS: Negative, with the exception of above mentioned in HPI ? ?Objective:  ?BP 98/66   Pulse 91   Temp 98.2 ?F (36.8 ?C) (Oral)   Ht 5\' 5"  (1.651 m)   Wt 110 lb (49.9 kg)   SpO2 100%   BMI 18.30 kg/m?  ?Body mass index is 18.3 kg/m? ?Physical Exam ?Vitals and nursing note reviewed.  ?Constitutional:   ?   General: She is not in acute distress. ?   Appearance: Normal appearance. She is normal weight. She is not ill-appearing or toxic-appearing.   ?Eyes:  ?   Extraocular Movements: Extraocular movements intact.  ?   Conjunctiva/sclera: Conjunctivae normal.  ?   Pupils: Pupils are equal, round, and reactive to light.  ?Neurological:  ?   Mental Status: She is al

## 2021-05-16 DIAGNOSIS — F419 Anxiety disorder, unspecified: Secondary | ICD-10-CM | POA: Insufficient documentation

## 2021-05-16 DIAGNOSIS — F330 Major depressive disorder, recurrent, mild: Secondary | ICD-10-CM | POA: Insufficient documentation

## 2021-05-21 NOTE — Telephone Encounter (Signed)
signed

## 2021-05-30 ENCOUNTER — Telehealth: Payer: Self-pay

## 2021-05-30 DIAGNOSIS — F419 Anxiety disorder, unspecified: Secondary | ICD-10-CM

## 2021-05-30 DIAGNOSIS — G479 Sleep disorder, unspecified: Secondary | ICD-10-CM

## 2021-05-30 DIAGNOSIS — F330 Major depressive disorder, recurrent, mild: Secondary | ICD-10-CM

## 2021-05-30 NOTE — Telephone Encounter (Signed)
Received FMLA. Will place on PCP desk for review. ?Pt last seen 05/15/21. ?

## 2021-05-31 NOTE — Telephone Encounter (Signed)
Completed and placed back in [place] work basket. ?These make patient aware forms of been completed and referral to psychiatry has been placed per her request. ?

## 2021-05-31 NOTE — Addendum Note (Signed)
Addended by: Felix Pacini A on: 05/31/2021 11:55 AM ? ? Modules accepted: Orders ? ?

## 2021-06-01 NOTE — Telephone Encounter (Signed)
faxed

## 2021-06-01 NOTE — Telephone Encounter (Signed)
Original sent to scan for copy ?

## 2021-06-01 NOTE — Telephone Encounter (Signed)
Pt informed form is completed and would like copy scanned into chart.  ?

## 2021-06-26 ENCOUNTER — Encounter: Payer: Self-pay | Admitting: Family Medicine

## 2021-06-26 ENCOUNTER — Ambulatory Visit: Payer: BC Managed Care – PPO | Admitting: Family Medicine

## 2021-06-26 VITALS — BP 106/73 | HR 94 | Temp 97.6°F | Wt 113.4 lb

## 2021-06-26 DIAGNOSIS — G479 Sleep disorder, unspecified: Secondary | ICD-10-CM | POA: Diagnosis not present

## 2021-06-26 DIAGNOSIS — F330 Major depressive disorder, recurrent, mild: Secondary | ICD-10-CM | POA: Diagnosis not present

## 2021-06-26 DIAGNOSIS — F419 Anxiety disorder, unspecified: Secondary | ICD-10-CM

## 2021-06-26 MED ORDER — TRAZODONE HCL 50 MG PO TABS: 25.0000 mg | ORAL_TABLET | Freq: Every evening | ORAL | 5 refills | Status: DC | PRN

## 2021-06-26 MED ORDER — MIRTAZAPINE 30 MG PO TABS: 30.0000 mg | ORAL_TABLET | Freq: Every day | ORAL | 5 refills | Status: DC

## 2021-06-26 MED ORDER — VENLAFAXINE HCL ER 150 MG PO CP24: 150.0000 mg | ORAL_CAPSULE | Freq: Every day | ORAL | 5 refills | Status: DC

## 2021-06-26 NOTE — Progress Notes (Signed)
Patient ID: Terri [name], female   DOB: 22-Mar-1985, 36 y.o.   MRN: [id number] ? ? ? ?Terri [name] , Jun 06, 1985, 36 y.o., female ?MRN: [id number] ?Patient Care Team  ?  Relationship Specialty Notifications Start End  ?Ma Hillock, DO PCP - General Family Medicine  05/16/15   ?Dian Situ, MD Consulting Physician Pain Medicine  03/17/13   ?Pieter Partridge, DO Consulting Physician Neurology  03/24/15   ? ? ?Chief Complaint  ?Patient presents with  ? Depression  ? Anxiety  ? ? ?Subjective:  ?Terri [name] is a 36 y.o. female presents today to discuss her depression. ?Generalized anxiety disorder/Depression with anxiety/Panic disorder ?Sleep disorder ?Pt reports compliance with effexor 75 mg qd and remeron 30 mg qhs. She reports she has continued therapy sessions. She is back to work. She is starting to feel improved from last visit. She reports it has been a rough process, but she is making it. She denies SI or HI.  ? ?Prior note: ?Patient presents today to discuss her recurrent depression and anxiety.  In the past she has had been tried on Effexor, Seroquel, Remeron and Celexa.  She has not required medications for about 2 years.  She reports her symptoms rebounded when she had a traumatic experience finding her friend dead on the bathroom floor.  She denies any SI or HI .  She would like to restart her medications that she had been on prior.  She has started therapy 2 weeks ago. ? ?Prior Notes: ?She reports the Seroquel 50 mg helped her sleep, but caused her to be too sleepy the next day. It helped with anxiety as well. She states she preferred her Remeron over the Seroquel. She has tapered up to the effexor 75 mg qd (prior dose 150). She states she does feel she needs to continue tapering to the 150 mg QD.  ?Overall much improved. She did use the [place] only after she discontinued the Seroquel.  ? ?Initial note 09/18/2016: ?Pt presents for an OV with Increased complaint of anxiety and depression. She states over the  last 2 months something has changed, she is not certain what it is and she is feeling depressed. She has been on medications in the past, and has seen counseling when she was a teenager. She reports she always felt like she really didn't need medications and could handle things on her own. Last noted she was on Celexa a few years ago, and she felt like she didn't really need it and she was doing well. She reports this has changed over the last few months, she found that she is being very hard on herself and critical. She used to love to this dinner parties and be social with her family and friends, and now she doesn't enjoy as much because she is critical about what she says what she thinks she is cooking etc. She started a new job a few months ago, but states that she loves it, her boss is great since she does not think this is the source of her issue. She is prescribed Klonopin twice a day when necessary for her long-term anxiety, she in the past has infrequently uses. She states she has been using it regularly as scheduled last few weeks. She also has continued to use her Ambien when needed. She states she knows she has some guilt surrounding the child she put up for adoption about 9 years ago. Her family has contact with the child (daughter), but  patient does not. She has always felt she has dealt with that situation well, and did it was all right for the child, but admits today maybe she is dealing with it as well as she thought. ? ? ?  06/26/2021  ?  3:10 PM 05/15/2021  ?  2:59 PM 03/28/2021  ? 11:45 AM 04/21/2019  ? 10:38 AM 02/23/2019  ?  8:07 AM  ?Depression screen PHQ 2/9  ?Decreased Interest 2 3 0 3 3  ?Down, Depressed, Hopeless 2 3 0 3 3  ?PHQ - 2 Score 4 6 0 6 6  ?Altered sleeping 2 3  0 3  ?Tired, decreased energy 3 3  3 3   ?Change in appetite 1 3  3 1   ?Feeling bad or failure about yourself  2 3  3 3   ?Trouble concentrating 3 2  3 1   ?Moving slowly or fidgety/restless 1 1  0 0  ?Suicidal thoughts 1 1  1  0   ?PHQ-9 Score 17 22  19 17   ?Difficult doing work/chores    Very difficult Very difficult  ? ? ?  06/26/2021  ?  3:11 PM 05/15/2021  ?  2:59 PM 04/21/2019  ? 10:37 AM 02/23/2019  ?  8:09 AM  ?GAD 7 : Generalized Anxiety Score  ?Nervous, Anxious, on Edge 2 3 0 3  ?Control/stop worrying 1 2 3 1   ?Worry too much - different things 2 3 3 3   ?Trouble relaxing 2 3 0 1  ?Restless 1 2 0 3  ?Easily annoyed or irritable 2 2 1 1   ?Afraid - awful might happen 1 3 0 0  ?Total GAD 7 Score 11 18 7 12   ?Anxiety Difficulty   Extremely difficult Very difficult  ? ? ?Allergies  ?Allergen Reactions  ? Penicillins Hives and Rash  ?  Has patient had a PCN reaction causing immediate rash, facial/tongue/throat swelling, SOB or lightheadedness with hypotension: Yes ?Has patient had a PCN reaction causing severe rash involving mucus membranes or skin necrosis: No ?Has patient had a PCN reaction that required hospitalization: No ?Has patient had a PCN reaction occurring within the last 10 years: No ?If all of the above answers are "NO", then may proceed with Cephalosporin use.; ?  ? ?Social History  ? ?Tobacco Use  ? Smoking status: Every Day  ?  Packs/day: 0.30  ?  Types: Cigarettes  ?  Last attempt to quit: 02/12/2007  ?  Years since quitting: 14.3  ? Smokeless tobacco: Never  ?Substance Use Topics  ? Alcohol use: Yes  ?  Comment: occasional  ? ?Past Medical History:  ?Diagnosis Date  ? Allergic rhinitis 12/01/2014  ? Allergy   ? Brain injury [facility]) 36 yrs old  ? 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 Chronic pain disorder   ? Chronic right hip pain   ? Closed head injury About age 47 yrs  ? ICH per pt, with seizure disorder for appox 2 yrs afterward.  No significant residual disability.  ? Cocaine abuse ([place]) 08/23/2019  ? Depression with anxiety   ? Failed buspar.  Did well on citalopram.  ? Drug overdose 2004  ? Xanax  ? Generalized anxiety disorder 07/05/2010  ? H/O varicella   ? History of  chlamydia   ? HSV infection   ? Genital clx pos HSV 2--2007.  ? Hx of migraines   ? chronic migraines (Dr. Tomi Likens) 03/2015  ?  Hx: UTI (urinary tract infection) 09/06/10  ? IBS (irritable bowel syndrome)   ? Panic disorder 07/05/2010  ? Peri-trochanteric right hip tendinitis   ? Right hip pain 05/2009  ? MRI 10/12/09: Femoral head and neck stress injury plus small acetabular labral tear.  [facility] ortho (Dr. Aretha Parrot) as of 11/2012.  Preferred pain mgmt: steroid injection into right subtrochanteric bursa 12/06/15.  ? Seizure ([place])   ? for 2 yrs after closed head injury/ICH  ? Sleep disorder 09/18/2016  ? Stimulant abuse ([place]) 08/23/2019  ? Tear of right acetabular labrum   ? Trigger point of right side of body 05/09/2014  ? Trochanteric bursitis of right hip   ? Trochanteric bursitis, right hip 11/30/2018  ? ?Past Surgical History:  ?Procedure Laterality Date  ? HIP SURGERY  10/10/2010  ? Right hip GT bursectomy and osteophytectomy for intractable right greater troch pain (Dr. Maureen Ralphs).  [facility] second opinion 06/2012--resulted in repeat MRI, then right hip IA injection that gave mild short term response, then was referred to PT with iontophoresis (as of 09/07/12)--was told to f/u in 6 wks.  ? MOLE REMOVAL    ? on back  ? WISDOM TOOTH EXTRACTION    ? ?Family History  ?Problem Relation Age of Onset  ? Breast cancer Mother 26  ? Thyroid cancer Mother   ? Hepatitis Father   ?     Hep C  ? Alcohol abuse Father   ? Thyroid disease Sister   ? Anxiety disorder Sister   ? Thyroid cancer Sister   ? Thrombophlebitis Maternal Grandfather   ? Anxiety disorder Maternal Grandfather   ? Heart disease Maternal Grandfather   ? Alzheimer's disease Paternal Grandmother   ? ? ?No results found for this or any previous visit (from the past 24 hour(s)). ?No results found. ? ? ?ROS: Negative, with the exception of above mentioned in HPI ? ?Objective:  ?BP 106/73   Pulse 94   Temp 97.6 ?F (36.4 ?C)   Wt 113 lb 6.4 oz (51.4 kg)   SpO2 99%   BMI 18.87 kg/m?   ?Body mass index is 18.87 kg/m?Marland Kitchen ?Physical Exam ?Vitals and nursing note reviewed.  ?Constitutional:   ?   General: She is not in acute distress. ?   Appearance: Normal appearance. She is normal weight. She is

## 2021-06-26 NOTE — Patient Instructions (Addendum)
No follow-ups on file.        Great to see you today.   If labs were collected, we will inform you of lab results once received either by echart message or telephone call.   - echart message- for normal results that have been seen by the patient already.   - telephone call: abnormal results or if patient has not viewed results in their echart.    

## 2021-07-07 DIAGNOSIS — N390 Urinary tract infection, site not specified: Secondary | ICD-10-CM | POA: Diagnosis not present

## 2021-07-12 ENCOUNTER — Ambulatory Visit: Payer: Self-pay | Admitting: *Deleted

## 2021-07-12 NOTE — Telephone Encounter (Signed)
Mr. Terri [name] is not on DPR.   Asked him to have [name] call into [facility] to discuss her medical issues (depression) and make an appt.  He was agreeable.    Reason for Disposition  Requesting regular office appointment  Answer Assessment - Initial Assessment Questions 1. REASON FOR CALL or QUESTION: "What is your reason for calling today?" or "How can I best help you?" or "What question do you have that I can help answer?"     I returned the call on 2nd attempt and Mr. Terri [name] answered.  He was able to tell me her name and birthday though he struggled with her year of birth.  He is wanting to make an appt with [facility] for [name].   I asked if she was wanting to change doctors since she is seeing a doctor.  He responded,   "Oh I didn't know she was already seeing someone".  I let him know I could not discuss her medical information with him since he is not on her DPR.   I instructed him to have her call if she was wanting an appt. With [facility].   He replied,   "Ok, I will".  Protocols used: Information Only Call - No Triage-A-AH

## 2021-07-12 NOTE — Telephone Encounter (Signed)
Message from Areatha Keas sent at 07/12/2021 12:26 PM EDT  Summary: depression   Pts boyfriend called and wanted to schedule a new patient appt with [facility] for this pt/ he stated she was struggling with depression and he thought she needed to see someone and get a PCP / pt has a pcp at another office that it looks like she is talking to about her depression / he was not with pt / please advise

## 2021-07-12 NOTE — Telephone Encounter (Signed)
Attempted to call Mr. Lily Kocher, boyfriend back.   Left a message to call back.   (He is not listed on her DPR).

## 2021-08-01 ENCOUNTER — Other Ambulatory Visit: Payer: Self-pay | Admitting: Family Medicine

## 2021-08-03 ENCOUNTER — Encounter: Payer: Self-pay | Admitting: Family Medicine

## 2021-08-03 ENCOUNTER — Ambulatory Visit (INDEPENDENT_AMBULATORY_CARE_PROVIDER_SITE_OTHER): Payer: BC Managed Care – PPO | Admitting: Family Medicine

## 2021-08-03 VITALS — BP 119/84 | HR 116 | Temp 98.3°F | Wt 116.0 lb

## 2021-08-03 DIAGNOSIS — F330 Major depressive disorder, recurrent, mild: Secondary | ICD-10-CM

## 2021-08-03 DIAGNOSIS — F419 Anxiety disorder, unspecified: Secondary | ICD-10-CM | POA: Diagnosis not present

## 2021-08-03 DIAGNOSIS — F1911 Other psychoactive substance abuse, in remission: Secondary | ICD-10-CM

## 2021-08-03 DIAGNOSIS — F410 Panic disorder [episodic paroxysmal anxiety] without agoraphobia: Secondary | ICD-10-CM

## 2021-08-03 MED ORDER — OLANZAPINE-FLUOXETINE HCL 6-25 MG PO CAPS: 1.0000 | ORAL_CAPSULE | Freq: Every day | ORAL | 1 refills | Status: AC

## 2021-08-31 ENCOUNTER — Ambulatory Visit: Payer: BC Managed Care – PPO | Admitting: Family Medicine

## 2021-08-31 ENCOUNTER — Encounter: Payer: Self-pay | Admitting: Family Medicine

## 2021-08-31 NOTE — Progress Notes (Deleted)
Patient ID: Terri [name], female   DOB: 1985/05/16, 36 y.o.   MRN: [id number]    Terri [name], Terri [name] 20-Feb-1985, 36 y.o., female MRN: [id number] Patient Care Team    Relationship Specialty Notifications Start End  Natalia Leatherwood, DO PCP - General Family Medicine  05/16/15   Ardell Isaacs, MD Consulting Physician Pain Medicine  03/17/13   Drema Dallas, DO Consulting Physician Neurology  03/24/15     No chief complaint on file.   Subjective:  Terri [name] is a 36 y.o. female presents today to discuss her depression. Generalized anxiety disorder/Depression with anxiety/Panic disorder Sleep disorder Pt reports compliance with effexor 75 mg qd and remeron 30 mg qhs. She reports she has continued therapy sessions. She is back to work. She is starting to feel improved from last visit. She reports it has been a rough process, but she is making it. She denies SI or HI.   Prior note: Patient presents today to discuss her recurrent depression and anxiety.  In the past she has had been tried on Effexor, Seroquel, Remeron and Celexa.  She has not required medications for about 2 years.  She reports her symptoms rebounded when she had a traumatic experience finding her friend dead on the bathroom floor.  She denies any SI or HI .  She would like to restart her medications that she had been on prior.  She has started therapy 2 weeks ago.  Prior Notes: She reports the Seroquel 50 mg helped her sleep, but caused her to be too sleepy the next day. It helped with anxiety as well. She states she preferred her Remeron over the Seroquel. She has tapered up to the effexor 75 mg qd (prior dose 150). She states she does feel she needs to continue tapering to the 150 mg QD.  Overall much improved. She did use the [place] only after she discontinued the Seroquel.   Initial note 09/18/2016: Pt presents for an OV with Increased complaint of anxiety and depression. She states over the last 2 months something has changed,  she is not certain what it is and she is feeling depressed. She has been on medications in the past, and has seen counseling when she was a teenager. She reports she always felt like she really didn't need medications and could handle things on her own. Last noted she was on Celexa a few years ago, and she felt like she didn't really need it and she was doing well. She reports this has changed over the last few months, she found that she is being very hard on herself and critical. She used to love to this dinner parties and be social with her family and friends, and now she doesn't enjoy as much because she is critical about what she says what she thinks she is cooking etc. She started a new job a few months ago, but states that she loves it, her boss is great since she does not think this is the source of her issue. She is prescribed Klonopin twice a day when necessary for her long-term anxiety, she in the past has infrequently uses. She states she has been using it regularly as scheduled last few weeks. She also has continued to use her Ambien when needed. She states she knows she has some guilt surrounding the child she put up for adoption about 9 years ago. Her family has contact with the child (daughter), but patient does not. She has always felt she  has dealt with that situation well, and did it was all right for the child, but admits today maybe she is dealing with it as well as she thought.     08/03/2021    2:45 PM 06/26/2021    3:10 PM 05/15/2021    2:59 PM 03/28/2021   11:45 AM 04/21/2019   10:38 AM  Depression screen PHQ 2/9  Decreased Interest 3 2 3  0 3  Down, Depressed, Hopeless 3 2 3  0 3  PHQ - 2 Score 6 4 6  0 6  Altered sleeping 3 2 3   0  Tired, decreased energy 3 3 3  3   Change in appetite 3 1 3  3   Feeling bad or failure about yourself  3 2 3  3   Trouble concentrating 2 3 2  3   Moving slowly or fidgety/restless 3 1 1   0  Suicidal thoughts 2 1 1  1   PHQ-9 Score 25 17 22  19   Difficult  doing work/chores     Very difficult      08/03/2021    2:46 PM 06/26/2021    3:11 PM 05/15/2021    2:59 PM 04/21/2019   10:37 AM  GAD 7 : Generalized Anxiety Score  Nervous, Anxious, on Edge 3 2 3  0  Control/stop worrying 3 1 2 3   Worry too much - different things 3 2 3 3   Trouble relaxing 3 2 3  0  Restless 1 1 2  0  Easily annoyed or irritable 3 2 2 1   Afraid - awful might happen 3 1 3  0  Total GAD 7 Score 19 11 18 7   Anxiety Difficulty    Extremely difficult    Allergies  Allergen Reactions  . Penicillins Hives and Rash    Has patient had a PCN reaction causing immediate rash, facial/tongue/throat swelling, SOB or lightheadedness with hypotension: Yes Has patient had a PCN reaction causing severe rash involving mucus membranes or skin necrosis: No Has patient had a PCN reaction that required hospitalization: No Has patient had a PCN reaction occurring within the last 10 years: No If all of the above answers are "NO", then may proceed with Cephalosporin use.;    Social History   Tobacco Use  . Smoking status: Every Day    Packs/day: 0.30    Types: Cigarettes    Last attempt to quit: 02/12/2007    Years since quitting: 14.5  . Smokeless tobacco: Never  Substance Use Topics  . Alcohol use: Yes    Comment: occasional   Past Medical History:  Diagnosis Date  . Allergic rhinitis 12/01/2014  . Allergy   . Brain injury [facility]) 36 yrs old   fell off babysitters cabinet, and on a separate occasion she fell down a flight of stairs  . Chronic pain disorder   . Chronic right hip pain   . Closed head injury About age 87 yrs   ICH per pt, with seizure disorder for appox 2 yrs afterward.  No significant residual disability.  . Cocaine abuse ([facility]) 08/23/2019  . Depression with anxiety    Failed buspar.  Did well on citalopram.  . Drug overdose 2004   Xanax  . Generalized anxiety disorder 07/05/2010  . H/O varicella   . History of chlamydia   . HSV infection    Genital clx pos HSV  2--2007.  08/05/2021 Hx of migraines    chronic migraines (Dr. 06/28/2021) 03/2015  . Hx: UTI (urinary tract infection) 09/06/10  . IBS (irritable  bowel syndrome)   . Panic disorder 07/05/2010  . Peri-trochanteric right hip tendinitis   . Right hip pain 05/2009   MRI 10/12/09: Femoral head and neck stress injury plus small acetabular labral tear.  [facility] ortho (Dr. Caswell Corwin) as of 11/2012.  Preferred pain mgmt: steroid injection into right subtrochanteric bursa 12/06/15.  . Seizure ([facility])    for 2 yrs after closed head injury/ICH  . Sleep disorder 09/18/2016  . Stimulant abuse ([facility]) 08/23/2019  . Tear of right acetabular labrum   . Trigger point of right side of body 05/09/2014  . Trochanteric bursitis of right hip   . Trochanteric bursitis, right hip 11/30/2018   Past Surgical History:  Procedure Laterality Date  . HIP SURGERY  10/10/2010   Right hip GT bursectomy and osteophytectomy for intractable right greater troch pain (Dr. Despina Hick).  [facility] second opinion 06/2012--resulted in repeat MRI, then right hip IA injection that gave mild short term response, then was referred to PT with iontophoresis (as of 09/07/12)--was told to f/u in 6 wks.  Marland Kitchen MOLE REMOVAL     on back  . WISDOM TOOTH EXTRACTION     Family History  Problem Relation Age of Onset  . Breast cancer Mother 41  . Thyroid cancer Mother   . Hepatitis Father        Hep C  . Alcohol abuse Father   . Thyroid disease Sister   . Anxiety disorder Sister   . Thyroid cancer Sister   . Thrombophlebitis Maternal Grandfather   . Anxiety disorder Maternal Grandfather   . Heart disease Maternal Grandfather   . Alzheimer's disease Paternal Grandmother     No results found for this or any previous visit (from the past 24 hour(s)). No results found.   ROS: Negative, with the exception of above mentioned in HPI  Objective:  There were no vitals taken for this visit. There is no height or weight on file to calculate BMI. Physical Exam Vitals and nursing  note reviewed.  Constitutional:      General: She is not in acute distress.    Appearance: Normal appearance. She is normal weight. She is not ill-appearing or toxic-appearing.  Eyes:     Extraocular Movements: Extraocular movements intact.     Conjunctiva/sclera: Conjunctivae normal.     Pupils: Pupils are equal, round, and reactive to light.  Neurological:     Mental Status: She is alert and oriented to person, place, and time. Mental status is at baseline.  Psychiatric:        Attention and Perception: Attention and perception normal.        Mood and Affect: Mood normal. Mood is not anxious or depressed. Affect is not flat or angry.        Speech: Speech normal.        Behavior: Behavior normal. Behavior is not agitated, aggressive or hyperactive. Behavior is cooperative.        Thought Content: Thought content normal. Thought content is not paranoid or delusional. Thought content does not include homicidal or suicidal ideation.        Cognition and Memory: Cognition normal.        Judgment: Judgment normal.    Assessment/Plan: Terri [name] is a 36 y.o. female present for acute OV for  Generalized anxiety disorder Depression with anxiety Panic disorder Sleep disorder - improving. -Encouraged her to continue therapy.  I believe she will get great benefit out of therapy on multiple levels. Avoiding restarting benzos/Ambien/controlled  substance class with her history of addiction and breaking a controlled substance contract with [place]. Increase Effexor taper to Effexor 75 mg > 150 mg qd.  Continue  Remeron tapering to 30 mg nightly. Start trazodone 25-50 mg qhs.  Discussed emergent precautions and 24-hour clinics.  24-hour number Hotline for depression and anxiety was provided to her today. Follow-up 5.5 mos, unless needed sooner.    No orders of the defined types were placed in this encounter.  No orders of the defined types were placed in this encounter.       electronically signed by:  Felix Pacini, DO  [facility] - [place]
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
## 2021-12-12 ENCOUNTER — Ambulatory Visit: Payer: BC Managed Care – PPO | Admitting: Family Medicine
